# Patient Record
Sex: Male | Born: 1950 | Race: White | Hispanic: No | Smoking: Former smoker
Health system: Southern US, Community
[De-identification: ages and names within clinical notes are randomized; demographics above are authoritative.]

## PROBLEM LIST (undated history)

## (undated) DIAGNOSIS — G4733 Obstructive sleep apnea (adult) (pediatric): Secondary | ICD-10-CM

## (undated) DIAGNOSIS — I480 Paroxysmal atrial fibrillation: Secondary | ICD-10-CM

## (undated) DIAGNOSIS — G4700 Insomnia, unspecified: Secondary | ICD-10-CM

## (undated) DIAGNOSIS — R6521 Severe sepsis with septic shock: Secondary | ICD-10-CM

## (undated) DIAGNOSIS — I82619 Acute embolism and thrombosis of superficial veins of unspecified upper extremity: Secondary | ICD-10-CM

## (undated) DIAGNOSIS — I739 Peripheral vascular disease, unspecified: Secondary | ICD-10-CM

## (undated) DIAGNOSIS — I10 Essential (primary) hypertension: Secondary | ICD-10-CM

## (undated) DIAGNOSIS — R7989 Other specified abnormal findings of blood chemistry: Secondary | ICD-10-CM

## (undated) DIAGNOSIS — K219 Gastro-esophageal reflux disease without esophagitis: Secondary | ICD-10-CM

## (undated) DIAGNOSIS — J449 Chronic obstructive pulmonary disease, unspecified: Secondary | ICD-10-CM

## (undated) DIAGNOSIS — D649 Anemia, unspecified: Secondary | ICD-10-CM

## (undated) DIAGNOSIS — A419 Sepsis, unspecified organism: Secondary | ICD-10-CM

## (undated) DIAGNOSIS — I350 Nonrheumatic aortic (valve) stenosis: Secondary | ICD-10-CM

## (undated) DIAGNOSIS — R778 Other specified abnormalities of plasma proteins: Secondary | ICD-10-CM

## (undated) DIAGNOSIS — Z86711 Personal history of pulmonary embolism: Secondary | ICD-10-CM

## (undated) DIAGNOSIS — T7840XA Allergy, unspecified, initial encounter: Secondary | ICD-10-CM

## (undated) HISTORY — DX: Nonrheumatic aortic (valve) stenosis: I35.0

## (undated) HISTORY — DX: Allergy, unspecified, initial encounter: T78.40XA

## (undated) HISTORY — DX: Essential (primary) hypertension: I10

## (undated) HISTORY — DX: Chronic obstructive pulmonary disease, unspecified: J44.9

## (undated) HISTORY — DX: Acute embolism and thrombosis of superficial veins of unspecified upper extremity: I82.619

## (undated) HISTORY — DX: Other specified abnormal findings of blood chemistry: R79.89

## (undated) HISTORY — PX: CHOLECYSTECTOMY: SHX55

## (undated) HISTORY — DX: Other specified abnormalities of plasma proteins: R77.8

## (undated) HISTORY — DX: Sepsis, unspecified organism: A41.9

## (undated) HISTORY — DX: Peripheral vascular disease, unspecified: I73.9

## (undated) HISTORY — DX: Paroxysmal atrial fibrillation: I48.0

## (undated) HISTORY — DX: Sepsis, unspecified organism: R65.21

## (undated) HISTORY — DX: Anemia, unspecified: D64.9

## (undated) HISTORY — DX: Gastro-esophageal reflux disease without esophagitis: K21.9

## (undated) HISTORY — DX: Insomnia, unspecified: G47.00

---

## 2001-02-10 ENCOUNTER — Emergency Department (HOSPITAL_COMMUNITY): Admission: EM | Admit: 2001-02-10 | Discharge: 2001-02-10 | Payer: Self-pay

## 2001-07-11 ENCOUNTER — Emergency Department (HOSPITAL_COMMUNITY): Admission: EM | Admit: 2001-07-11 | Discharge: 2001-07-11 | Payer: Self-pay | Admitting: Emergency Medicine

## 2001-07-13 ENCOUNTER — Emergency Department (HOSPITAL_COMMUNITY): Admission: EM | Admit: 2001-07-13 | Discharge: 2001-07-13 | Payer: Self-pay | Admitting: Emergency Medicine

## 2001-07-14 ENCOUNTER — Encounter (HOSPITAL_COMMUNITY): Admission: RE | Admit: 2001-07-14 | Discharge: 2001-10-12 | Payer: Self-pay | Admitting: Emergency Medicine

## 2005-07-02 ENCOUNTER — Inpatient Hospital Stay (HOSPITAL_COMMUNITY): Admission: EM | Admit: 2005-07-02 | Discharge: 2005-07-05 | Payer: Self-pay | Admitting: Emergency Medicine

## 2006-02-16 ENCOUNTER — Inpatient Hospital Stay (HOSPITAL_COMMUNITY): Admission: EM | Admit: 2006-02-16 | Discharge: 2006-02-22 | Payer: Self-pay | Admitting: Emergency Medicine

## 2006-02-20 ENCOUNTER — Ambulatory Visit: Payer: Self-pay | Admitting: Physical Medicine & Rehabilitation

## 2007-03-05 ENCOUNTER — Inpatient Hospital Stay (HOSPITAL_COMMUNITY): Admission: EM | Admit: 2007-03-05 | Discharge: 2007-03-07 | Payer: Self-pay | Admitting: Emergency Medicine

## 2007-03-05 ENCOUNTER — Ambulatory Visit: Payer: Self-pay | Admitting: *Deleted

## 2007-03-22 ENCOUNTER — Encounter (INDEPENDENT_AMBULATORY_CARE_PROVIDER_SITE_OTHER): Payer: Self-pay | Admitting: Internal Medicine

## 2007-03-22 ENCOUNTER — Ambulatory Visit: Payer: Self-pay | Admitting: Internal Medicine

## 2007-03-22 ENCOUNTER — Telehealth: Payer: Self-pay | Admitting: *Deleted

## 2007-03-22 DIAGNOSIS — F411 Generalized anxiety disorder: Secondary | ICD-10-CM | POA: Insufficient documentation

## 2007-03-22 DIAGNOSIS — J449 Chronic obstructive pulmonary disease, unspecified: Secondary | ICD-10-CM

## 2007-03-22 DIAGNOSIS — R740 Nonspecific elevation of levels of transaminase and lactic acid dehydrogenase [LDH]: Secondary | ICD-10-CM

## 2007-03-22 DIAGNOSIS — K219 Gastro-esophageal reflux disease without esophagitis: Secondary | ICD-10-CM

## 2007-03-22 DIAGNOSIS — Z8719 Personal history of other diseases of the digestive system: Secondary | ICD-10-CM | POA: Insufficient documentation

## 2007-03-22 DIAGNOSIS — R7402 Elevation of levels of lactic acid dehydrogenase (LDH): Secondary | ICD-10-CM | POA: Insufficient documentation

## 2007-03-22 DIAGNOSIS — J4489 Other specified chronic obstructive pulmonary disease: Secondary | ICD-10-CM | POA: Insufficient documentation

## 2007-03-22 DIAGNOSIS — F17200 Nicotine dependence, unspecified, uncomplicated: Secondary | ICD-10-CM

## 2007-03-23 LAB — CONVERTED CEMR LAB
ALT: 12 units/L (ref 0–53)
BUN: 9 mg/dL (ref 6–23)
Calcium: 9.5 mg/dL (ref 8.4–10.5)
Chloride: 105 meq/L (ref 96–112)
Cholesterol: 213 mg/dL — ABNORMAL HIGH (ref 0–200)
Creatinine, Ser: 0.97 mg/dL (ref 0.40–1.50)
Glucose, Bld: 101 mg/dL — ABNORMAL HIGH (ref 70–99)
HDL: 54 mg/dL (ref >39)
LDL Cholesterol: 143 mg/dL — ABNORMAL HIGH (ref 0–99)
Potassium: 4.8 meq/L (ref 3.5–5.3)
Total Bilirubin: 0.5 mg/dL (ref 0.3–1.2)
Total Protein: 6.9 g/dL (ref 6.0–8.3)
VLDL: 16 mg/dL (ref 0–40)

## 2007-04-04 ENCOUNTER — Ambulatory Visit: Payer: Self-pay | Admitting: Gastroenterology

## 2007-04-04 LAB — CONVERTED CEMR LAB
ALT: 16 units/L (ref 0–53)
AST: 22 units/L (ref 0–37)
Alkaline Phosphatase: 81 units/L (ref 39–117)
Amylase: 72 units/L (ref 27–131)
Basophils Relative: 0.5 % (ref 0.0–1.0)
Eosinophils Absolute: 0.2 10*3/uL (ref 0.0–0.6)
HCT: 35.8 % — ABNORMAL LOW (ref 39.0–52.0)
Lymphocytes Relative: 22.5 % (ref 12.0–46.0)
Monocytes Absolute: 0.7 10*3/uL (ref 0.2–0.7)
Monocytes Relative: 9.3 % (ref 3.0–11.0)
Neutro Abs: 4.6 10*3/uL (ref 1.4–7.7)
Neutrophils Relative %: 65.2 % (ref 43.0–77.0)
RDW: 12.7 % (ref 11.5–14.6)

## 2007-04-05 ENCOUNTER — Encounter: Payer: Self-pay | Admitting: Gastroenterology

## 2007-04-05 ENCOUNTER — Ambulatory Visit: Payer: Self-pay | Admitting: Gastroenterology

## 2007-04-12 ENCOUNTER — Encounter: Payer: Self-pay | Admitting: Gastroenterology

## 2007-04-12 ENCOUNTER — Ambulatory Visit: Payer: Self-pay | Admitting: Gastroenterology

## 2008-04-23 ENCOUNTER — Encounter (INDEPENDENT_AMBULATORY_CARE_PROVIDER_SITE_OTHER): Payer: Self-pay | Admitting: *Deleted

## 2008-05-07 ENCOUNTER — Encounter (INDEPENDENT_AMBULATORY_CARE_PROVIDER_SITE_OTHER): Payer: Self-pay | Admitting: *Deleted

## 2009-09-29 ENCOUNTER — Telehealth: Payer: Self-pay | Admitting: Gastroenterology

## 2010-04-15 NOTE — Progress Notes (Signed)
Summary: Schedule Colonoscopy   Phone Note Outgoing Call Call back at Home Phone [phone]   Call placed by: Harlow Mares CMA Duncan Dull),  September 29, 2009 3:17 PM Call placed to: Patient Summary of Call: called pt he states that he never recieved the letter but if we will mail him another letter to his new address he will call back later to schedule his colonoscopy I changed his address in EMR and when we mail out the next batch of letter for patients I will make sure he gets a reminders letter Initial call taken by: Harlow Mares CMA Duncan Dull),  September 29, 2009 3:18 PM

## 2010-07-27 NOTE — Letter (Signed)
April 04, 2007    [name]   RE:  [name], Warren [name]  MRN:  [id number]  /  DOB:  [birth year]   Dear Mr. [name]:   It is my pleasure to have treated you recently as a new patient in my  office.  I appreciate your confidence and the opportunity to participate  in your care.   Since I do have a busy inpatient endoscopy schedule and office schedule,  my office hours vary weekly.  I am, however, available for emergency  calls every day through my office.  If I cannot promptly meet an urgent  office appointment, another one of our gastroenterologists will be able  to assist you.   My well-trained staff are prepared to help you at all times.  For  emergencies after office hours, a physician from our gastroenterology  section is always available through my 24-hour answering service.   While you are under my care, I encourage discussion of your questions  and concerns, and I will be happy to return your calls as soon as I am  available.   Once again, I welcome you as a new patient and I look forward to a happy  and healthy relationship.    Sincerely,      Barbette Hair. Arlyce Dice, MD,FACG  Electronically Signed   RDK/MedQ  DD: 04/04/2007  DT: 04/04/2007  Job #: [id number]

## 2010-07-27 NOTE — Letter (Signed)
April 04, 2007    Dr. Elby Showers  [facility]  [address]  [place], [place] [address]   RE:  [name], Warren  MRN:  [id number]  /  DOB:  1950-08-09   Dear Dr. Clent Ridges:   Upon your kind referral, I had the pleasure of evaluating your patient  and I am pleased to offer my findings.  I saw [name] in the  office today.  Enclosed is a copy of my progress note that details my  findings and recommendations.   Thank you for the opportunity to participate in your patient's care.    Sincerely,      Barbette Hair. Arlyce Dice, MD,FACG  Electronically Signed    RDK/MedQ  DD: 04/04/2007  DT: 04/04/2007  Job #: [id number]
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
## 2010-07-27 NOTE — Assessment & Plan Note (Signed)
[facility]                         GASTROENTEROLOGY OFFICE NOTE   KALAB, CAMPS                       MRN:          [id number]  DATE:04/04/2007                            DOB:          10-Oct-1950    REASON FOR CONSULTATION:  Pancreatitis.   Warren [name] is a 60 year old white male referred through the courtesy of  Dr. Clent Ridges for evaluation.  He was hospitalized for 2 days before  Christmas for acute pancreatitis.  He has a history of heavy alcohol  use, though he has had none for 6 months.  Labs were pertinent for an  alkaline phos at 263, and a total bilirubin of 4.3, and transaminases  ranging from the 200s to the mid 100s.  Abdominal CT demonstrated a  fatty liver, and questionable increased gastric folds (it was unclear  whether the stomach was fully distended).  Biliary tract looked normal.  He is status post cholecystectomy.  He has had a prior episode of  pancreatitis about 15 months before that occurred about several months  after his cholecystectomy.  He was also pertinent for a 6.6-mm enhancing  lesion of the right lobe of the liver that was etiology indeterminate.  It was also pertinent for calcifications within the uncinate process and  pancreatic head.   Since discharge, Warren [name] has had no further episodes of pain.  He has  no other GI complaints.   PAST MEDICAL HISTORY:  Pertinent for hypertension, anxiety and  depression.   FAMILY HISTORY:  Noncontributory.   MEDICATIONS:  1. Trazodone.  2. Zantac.  3. Potassium.   He has no allergies.   He smokes a pack a day.  He no longer drinks.  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   REVIEW OF SYSTEMS:  Positive for fatigue and back pain.   PHYSICAL EXAMINATION:  Pulse 80, blood pressure 130/80, weight 199.  HEENT: EOMI.  PERRLA.  Sclerae are anicteric.  Conjunctivae are pink.  NECK:  Supple without thyromegaly, adenopathy or carotid bruits.  CHEST:  Clear to  auscultation and percussion without adventitious  sounds.  CARDIAC:  Regular rhythm; normal S1 S2.  There are no murmurs, gallops  or rubs.  ABDOMEN:  Bowel sounds are normoactive.  Abdomen is soft, nontender and  nondistended.  There are no abdominal masses, tenderness, splenic  enlargement or hepatomegaly.  EXTREMITIES:  Full range of motion.  No cyanosis, clubbing or edema.  RECTAL:  Deferred.   IMPRESSION:  1. A recent episode of acute pancreatitis.  Etiology, presumably, is      alcohol, though he claims to have abstained over the last 6 months.      A CT does show changes of chronic pancreatitis.  Biliary tract      disease from retained recurrent bile duct stones is a      consideration, though bile duct measured only 3.8 mm.  2. Right lobe hepatic lesion.  This could be hemangioma.  Significance      is uncertain at this time.  3. Questionable enlarged folds of the gastric body.  4. Hepatic steatosis.  RECOMMENDATION:  1. Upper endoscopy.  2. Repeat liver function tests.  3. Screening colonoscopy.     Warren [name]. Arlyce Dice, MD,FACG  Electronically Signed    RDK/MedQ  DD: 04/04/2007  DT: 04/04/2007  Job #: [id number]   cc:   Elby Showers, MD

## 2010-07-30 NOTE — Discharge Summary (Signed)
NAME:  Warren [name], Warren [name]                ACCOUNT NO.:  [id number]   MEDICAL RECORD NO.:  [id number]          PATIENT TYPE:  INP   LOCATION:  3034                         FACILITY:  [facility]   PHYSICIAN:  Melissa L. Ladona Ridgel, MD  DATE OF BIRTH:  Dec 11, 1950   DATE OF ADMISSION:  07/01/2005  DATE OF DISCHARGE:  07/05/2005                               DISCHARGE SUMMARY   CHIEF COMPLAINT:  Chest and abdominal pain.   DISCHARGING DIAGNOSES:  1. Chest and abdominal pain secondary to acute pancreatitis, likely      secondary to alcohol abuse.  The patient was treated with      intravenous hydration, antiemetic medications and pain medications.      It was determined that his pancreatitis was likely the source and      therefore bowel rest was recommended.  2. Gastroesophageal reflux disease:  The patient was treated with a      proton pump inhibitor.  3. Hypertension.  The patient was maintained on his propranolol and      his hydrochlorothiazide was resumed at the time of discharge.   MEDICATIONS AT TIME OF DISCHARGE:  1. Prilosec 20 mg once daily.  2. Propranolol 80 mg once daily.  3. Clonazepam 1 mg p.o. p.r.n.  4. Hydrochlorothiazide 25 mg once daily.  5. Aspirin 325 mg once daily.   HOSPITAL COURSE:  Please note that the patient's steroids were  discontinued on the day of discharge, July 02, 2005.  At the time of  discharge, the patient is deemed stable for followup as an outpatient.   DIET:  He was discharged to home on a heart-healthy diet.   FOLLOWUP:  Follow up with a physician of his choice.   DISCHARGE INSTRUCTIONS:  The patient was instructed on alcohol abuse and  avoidance of alcohol.      Melissa L. Ladona Ridgel, MD  Electronically Signed     MLT/MEDQ  D:  08/10/2005  T:  08/10/2005  Job:  [id number]

## 2010-07-30 NOTE — H&P (Signed)
NAME:  Warren [name], Warren [name]                ACCOUNT NO.:  [id number]   MEDICAL RECORD NO.:  [id number]          PATIENT TYPE:  INP   LOCATION:  1825                         FACILITY:  [facility]   PHYSICIAN:  Michelene Gardener, MD    DATE OF BIRTH:  1950/12/02   DATE OF ADMISSION:  07/02/2005  DATE OF DISCHARGE:  06/15/2005                                HISTORY & PHYSICAL   PRIMARY CARE PHYSICIAN:  The patient is unassigned; this is Horticulturist, commercial.   CHIEF COMPLAINT:  Left-sided chest pain and epigastric pain since 10:30 a.m.  of July 02, 2005.   HISTORY OF PRESENT ILLNESS:  This is a 60 year old Caucasian male with past  medical history of hypotension, alcohol and gallstones, who presented with  the above-mentioned complaint.  The patient had his gallbladder removed  around 6 months ago.  Around 10:30 a.m. of yesterday, he started developing  pain.  The pain started in his left side of his chest and then went down  into his epigastric region and his right upper quadrant and left upper  extremity.  The pain is described as sharp, around 8-9/10, as mentioned,  radiating down to his epigastric region and to his right and left upper  quadrant regions.  It is associated with nausea and the patient has vomited  once.  He stated the vomitus contained yellowish material and there is no  blood.  He denied diarrhea in the ER and he was given pain medication; his  pain went down to around 7/10.  Ultrasound was done in the emergency room  and it was negative as per the ER attending.   PAST MEDICAL HISTORY:  1.  Hypertension.  2.  Anxiety.  3.  Acid reflux.  4.  Alcohol drinking.   PAST SURGICAL HISTORY:  Status post cholecystectomy done around 6 months  ago.   MEDICATIONS:  1.  Aspirin 325 mg once daily.  2.  Clonazepam 1 mg once to twice daily p.r.n.  3.  Hydrochlorothiazide 25 mg once daily.  4.  Propranolol 80 mg once daily.   ALLERGIES:  No known drug allergies.   SOCIAL HISTORY:  The  patient smoked around 2 packs per day for 45 years.  He  drinks alcohol almost every day, most evenings beer, but he states he also  drinks vodka; the last time was Thursday, where he took 2 vodkas.  He denies  recreational drugs.   FAMILY HISTORY:  Family history is significant for hypertension.   REVIEW OF SYSTEMS:  Review of systems showed left-sided sharp chest pain,  epigastric and left upper quadrant pain, nausea and vomiting.  CONSTITUTIONAL:  There is no fever, no weakness and no fatigability.  EYES:  No blurred vision.  No pain.  No redness.  ENT:  No tinnitus, no ear pain,  no hearing loss, no epistaxis, no difficulty swallowing.  RESPIRATORY:  No  cough, no wheezes, no hemoptysis, no dyspnea.  CARDIOVASCULAR:  No chest  pain, no edema, no palpitations, no syncope.  GI:  Positive for nausea,  vomiting and abdominal pain.  There is  no diarrhea, no constipation, no  hematemesis and no melena.  GU:  No dysuria, no hematuria, no frequency.  ENDOCRINE:  No polyuria, no nocturia, no sweating.  ID:  No acne, no rash,  no lesions.  NEUROLOGIC:  No numbness, no tingling, no weakness, no  dysarthria, no tremors and no headache.  The rest of the systems were  reviewed and they were negative.   PHYSICAL EXAMINATION:  VITAL SIGNS:  Temperature is 97.2, blood pressure  140/85, respiratory rate is 10, pulse is 79.  GENERAL APPEARANCE:  This is a middle-aged Caucasian male who is lying down  in bed and seems in some distress because of his pain.  HEENT:  Conjunctivae are normal.  There is no blood or erythema.  Pupils are  equal, round and reactive to light and accommodation.  There is no ptosis.  Hearing is intact.  There is no ear discharge or infection.  There is no  nose discharge, infection or bleeding.  Oral mucosa is moist and there is no  pharyngeal erythema.  NECK:  Supple.  No JVD, no carotid bruit, no lymphadenopathy, no thyroid  enlargement, no thyroid tenderness.   CARDIOVASCULAR:  S1 and S2 are heard.  There are no additional heart sounds.  There are no murmurs and no gallops noticed.  Peripheral pulses are strong  with no bruit and no femoral pulses are strong with no bruit.  RESPIRATORY:  The patient is breathing between 10-14.  There is no use of  accessory muscles, no intercostal retractions, no dullness, no  hyperresonance, no rales, no rhonchi and no wheezes.  ABDOMEN:  The abdomen is soft.  There is tenderness in the epigastric region  in the left upper quadrant region.  There is no hepatosplenomegaly.  Bowel  sounds are normal.  EXTREMITIES:  Lower extremities showed no edema, no varicose veins and pedal  pulses normal.  SKIN:  No rash, no erythema and skin is warm to touch.  NEUROLOGICAL:  Cranial nerves are intact, around II-XII.  There are no motor  or sensory deficits.  PSYCHIATRIC:  The patient looks anxious around his condition.  He is alert,  awake and oriented x3.  No recent or remote memory impairment.   LABORATORY RESULTS:  WBC 11.7, hemoglobin 15.0, hematocrit 41.9, platelet  count is 304,000 and MCV is 101.  Sodium 134, potassium 3.2, chloride 101,  BUN 13, glucose is 106, AST is 74, ALT 56, alkaline phosphatase is 81.   EKG showed normal sinus rhythm at 66 beats per minute.  There are no Q  waves.  There is nonspecific T wave inversion in leads V1 and aVR, no ST  segment abnormalities.   IMPRESSION:  This is a 60 year old male with past medical history of  gallstones, status post cholecystectomy, who presented with atypical chest  pain radiating down to his epigastrium and left upper quadrant.   ASSESSMENT AND PLAN:  1.  Pancreatitis:  As mentioned, this patient has a history of gallstones,      status post cholecystectomy.  He is also a heavy alcohol drinker.  His      laboratories are consistent with pancreatitis.  His amylase is elevated     at 169 and his lipase is 259, so we will admit him to the floor, keep      him  nothing-by-mouth and give him intravenous fluids.  When the patient      is tolerating, then we will put on liquid diet and then increase  as      tolerated.  We will also give him antiemetics and pain medications as      needed.  If his condition deteriorates or if the pain persists, then we      will get a CT scan of the abdomen to rule out any complications from      pancreatitis.  2.  Atypical chest pain:  The way that the pain is described, it does not      seem to be like cardiac in origin and most likely it is from his      pancreatitis.  We will still admit him to Telemetry, given the fact that      he has a risk factor of hypertension and smoking.  He had already 2 sets      of troponin and cardiac enzymes that came to be normal.  His initial      EKGs are also normal.  We will get a third set of troponins.  If his      condition deteriorates or the chest pain persists, then we will consider      further intervention in the form of a stress test.  3.  Hyponatremia:  We will give him intravenous fluids and will repeat his      basic metabolic panel.  4.  Hypokalemia:  This might be secondary to his vomiting.  We will give him      [facility] and we will repeat his potassium level.  5.  Elevated liver function tests:  We will repeat his hepatic function      panel.  6.  Hypertension:  We will continue his current medications and we will      monitor his blood pressure.  7.  Gastroesophageal reflux disease.  8.  Anxiety.  9.  Alcohol abuse:  There are no symptoms of alcohol withdrawal at the      present time.  We will follow him and if he develops any signs of      withdrawal, then we will start him on Ativan protocol.  We will also      start him on thiamine, multivitamin and folic acid.   TOTAL ASSESSMENT TIME:  Fifty minutes.           ______________________________  Michelene Gardener, MD     NAE/MEDQ  D:  07/02/2005  T:  07/02/2005  Job:  [id number]
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
## 2010-07-30 NOTE — Discharge Summary (Signed)
NAME:  Warren [name], Warren [name]                ACCOUNT NO.:  [id number]   MEDICAL RECORD NO.:  [id number]          PATIENT TYPE:  INP   LOCATION:  3034                         FACILITY:  [facility]   PHYSICIAN:  Melissa L. Ladona Ridgel, MD  DATE OF BIRTH:  April 07, 1950   DATE OF ADMISSION:  07/01/2005  DATE OF DISCHARGE:  07/05/2005                                 DISCHARGE SUMMARY   CHIEF COMPLAINT:  Chest and abdominal pain.   DISCHARGING DIAGNOSES:  1.  Chest and abdominal pain secondary to acute pancreatitis, likely      secondary to alcohol abuse.  The patient was treated with intravenous      hydration, antiemetic medications and pain medications.  It was      determined that his pancreatitis was likely the source and therefore      bowel rest was recommended.  2.  Gastroesophageal reflux disease:  The patient was treated with a proton      pump inhibitor.  3.  Hypertension.  The patient was maintained on his propranolol and his      hydrochlorothiazide was resumed at the time of discharge.   MEDICATIONS AT TIME OF DISCHARGE:  1.  Prilosec 20 mg once daily.  2.  Propranolol 80 mg once daily.  3.  Clonazepam 1 mg p.o. p.r.n.  4.  Hydrochlorothiazide 25 mg once daily.  5.  Aspirin 325 mg once daily.   HOSPITAL COURSE:  Please note that the patient's steroids were discontinued  on the day of discharge, July 02, 2005.  At the time of discharge, the  patient is deemed stable for followup as an outpatient.   DIET:  He was discharged to home on a heart-healthy diet.   FOLLOWUP:  Follow up with a physician of his choice.   DISCHARGE INSTRUCTIONS:  The patient was instructed on alcohol abuse and  avoidance of alcohol.      Melissa L. Ladona Ridgel, MD     MLT/MEDQ  D:  08/10/2005  T:  08/10/2005  Job:  [id number]
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
## 2010-12-17 LAB — BILIRUBIN, FRACTIONATED(TOT/DIR/INDIR)
Bilirubin, Direct: 3.4 — ABNORMAL HIGH
Indirect Bilirubin: 1.6 — ABNORMAL HIGH
Total Bilirubin: 5 — ABNORMAL HIGH

## 2010-12-17 LAB — URINALYSIS, ROUTINE W REFLEX MICROSCOPIC
Glucose, UA: NEGATIVE
Hgb urine dipstick: NEGATIVE
Ketones, ur: NEGATIVE
Protein, ur: NEGATIVE
pH: 7

## 2010-12-17 LAB — POCT I-STAT CREATININE
Creatinine, Ser: 1.1
Operator id: 294511

## 2010-12-17 LAB — CBC
HCT: 34.7 — ABNORMAL LOW
HCT: 37.8 — ABNORMAL LOW
Hemoglobin: 12 — ABNORMAL LOW
Hemoglobin: 12.8 — ABNORMAL LOW
MCHC: 34
MCV: 95.1
Platelets: 269
Platelets: 271
RBC: 3.94 — ABNORMAL LOW
RDW: 12.7
RDW: 13.1
WBC: 5.6
WBC: 6.9

## 2010-12-17 LAB — ETHANOL: Alcohol, Ethyl (B): <5

## 2010-12-17 LAB — POCT CARDIAC MARKERS
Myoglobin, poc: 123
Operator id: 294511

## 2010-12-17 LAB — COMPREHENSIVE METABOLIC PANEL
AST: 221 — ABNORMAL HIGH
Albumin: 2.9 — ABNORMAL LOW
Albumin: 3.2 — ABNORMAL LOW
Alkaline Phosphatase: 247 — ABNORMAL HIGH
Alkaline Phosphatase: 263 — ABNORMAL HIGH
BUN: 1 — ABNORMAL LOW
BUN: 6
CO2: 25
CO2: 26
Chloride: 103
Chloride: 97
Chloride: 97
Creatinine, Ser: 0.89
Creatinine, Ser: 1.25
GFR calc Af Amer: >60
GFR calc Af Amer: >60
GFR calc non Af Amer: >60
Glucose, Bld: 116 — ABNORMAL HIGH
Glucose, Bld: 134 — ABNORMAL HIGH
Potassium: 3.1 — ABNORMAL LOW
Potassium: 4.3
Sodium: 135
Total Bilirubin: 1.9 — ABNORMAL HIGH
Total Bilirubin: 4.3 — ABNORMAL HIGH
Total Bilirubin: 5.1 — ABNORMAL HIGH
Total Protein: 6.1

## 2010-12-17 LAB — HEPATITIS PANEL, ACUTE
HCV Ab: NEGATIVE
Hep A IgM: NEGATIVE
Hep B C IgM: NEGATIVE

## 2010-12-17 LAB — DIFFERENTIAL
Basophils Absolute: 0
Eosinophils Relative: 2
Lymphocytes Relative: 19
Lymphs Abs: 1.2
Neutro Abs: 4.1

## 2010-12-17 LAB — CK TOTAL AND CKMB (NOT AT ARMC)
CK, MB: 1
Total CK: 66

## 2010-12-17 LAB — TSH: TSH: 1.105

## 2010-12-17 LAB — TROPONIN I: Troponin I: 0.04

## 2010-12-17 LAB — I-STAT 8, (EC8 V) (CONVERTED LAB)
Acid-Base Excess: 2
Bicarbonate: 26.4 — ABNORMAL HIGH
Chloride: 101
HCT: 46
Operator id: 294511
TCO2: 28
pCO2, Ven: 40.3 — ABNORMAL LOW
pH, Ven: 7.424 — ABNORMAL HIGH

## 2010-12-17 LAB — LIPASE, BLOOD: Lipase: 1503 — ABNORMAL HIGH

## 2010-12-17 LAB — LIPID PANEL
HDL: 44
Total CHOL/HDL Ratio: 4.8

## 2010-12-17 LAB — RAPID URINE DRUG SCREEN, HOSP PERFORMED
Amphetamines: NOT DETECTED
Benzodiazepines: NOT DETECTED
Cocaine: NOT DETECTED

## 2010-12-17 LAB — ACETAMINOPHEN LEVEL: Acetaminophen (Tylenol), Serum: <10 — ABNORMAL LOW

## 2010-12-17 LAB — HIV-1 RNA QUANT-NO REFLEX-BLD: HIV-1 RNA Quant, Log: <1.7 (ref <1.70)

## 2010-12-17 LAB — CARDIAC PANEL(CRET KIN+CKTOT+MB+TROPI)
CK, MB: 1
Relative Index: INVALID
Troponin I: 0.03

## 2010-12-17 LAB — AMMONIA: Ammonia: 31

## 2011-07-27 ENCOUNTER — Encounter: Payer: Self-pay | Admitting: Gastroenterology

## 2016-01-08 ENCOUNTER — Encounter: Payer: Self-pay | Admitting: Gastroenterology

## 2016-03-01 ENCOUNTER — Telehealth: Payer: Self-pay | Admitting: *Deleted

## 2016-03-01 NOTE — Telephone Encounter (Signed)
Thanks for your note.  Yes if he has not had an EGD within 3 years he should have one for Barrett's I think if he is off oxygen for the past 3 weeks and does not use it normally anymore, he should be okay to have procedures done in the [place]. thanks

## 2016-03-01 NOTE — Telephone Encounter (Signed)
Dr Havery Moros,  1.When reviewing pt paper work from [facility] for pre-visit, I noticed pt was on O2 at home, I called pt and spoke with him, he states he was using it after he had pneumonia in July up to about 3 weeks ago, he states he has not used it since, is pt ok for [facility]? Do you want OV?  2.Also when looking back at pt history, he had an EGD 04/05/07 with dx of barrett's, does this need to be followed up? Does pt need EGD as well?  Thanks-adm

## 2016-03-01 NOTE — Telephone Encounter (Signed)
Called Warren [name] back to advise Dr Havery Moros does recommend EGD to follow-up with barrett's dx in 2009, I cancelled Warren [name]'s current appoitments and rescheduled Warren [name] for previsit 1/24 and endo/colon on 04/14/16.-adm

## 2016-03-21 ENCOUNTER — Encounter: Payer: Self-pay | Admitting: Gastroenterology

## 2016-04-06 ENCOUNTER — Encounter: Payer: Self-pay | Admitting: Family

## 2016-04-14 ENCOUNTER — Encounter: Payer: Self-pay | Admitting: Gastroenterology

## 2016-05-19 ENCOUNTER — Ambulatory Visit (AMBULATORY_SURGERY_CENTER): Payer: Self-pay

## 2016-05-19 ENCOUNTER — Telehealth: Payer: Self-pay

## 2016-05-19 DIAGNOSIS — Z860100 Personal history of colon polyps, unspecified: Secondary | ICD-10-CM

## 2016-05-19 DIAGNOSIS — K219 Gastro-esophageal reflux disease without esophagitis: Secondary | ICD-10-CM

## 2016-05-19 MED ORDER — SUPREP BOWEL PREP KIT 17.5-3.13-1.6 GM/177ML PO SOLN: 1.0000 | Freq: Once | ORAL | 0 refills | Status: AC

## 2016-05-19 NOTE — Telephone Encounter (Signed)
Yes okay in my opinion if only with CPAP but may want to check with anesthesia to confirm okay with them. thanks

## 2016-05-19 NOTE — Progress Notes (Signed)
No allergies to eggs or soy Uses O2 at night with CPAP No diet meds   "Fair" prep  Declined emmi

## 2016-05-19 NOTE — Telephone Encounter (Signed)
Dr Havery Moros,      Barber to proceed with procedure here at [facility]?  Uses O2 only at night and only VIA CPAP. Thank you, Xavia Kniskern/PV

## 2016-05-19 NOTE — Telephone Encounter (Signed)
OK per Jenny Reichmann and [place] CRNAs

## 2016-06-02 ENCOUNTER — Ambulatory Visit (AMBULATORY_SURGERY_CENTER): Payer: Medicare Other | Admitting: Gastroenterology

## 2016-06-02 VITALS — BP 106/57 | HR 93 | Temp 98.0°F | Resp 18 | Ht 73.0 in | Wt 198.0 lb

## 2016-06-02 DIAGNOSIS — K2950 Unspecified chronic gastritis without bleeding: Secondary | ICD-10-CM | POA: Diagnosis not present

## 2016-06-02 DIAGNOSIS — Z86010 Personal history of colonic polyps: Secondary | ICD-10-CM

## 2016-06-02 DIAGNOSIS — K2270 Barrett's esophagus without dysplasia: Secondary | ICD-10-CM

## 2016-06-02 DIAGNOSIS — D124 Benign neoplasm of descending colon: Secondary | ICD-10-CM

## 2016-06-02 DIAGNOSIS — D126 Benign neoplasm of colon, unspecified: Secondary | ICD-10-CM | POA: Diagnosis not present

## 2016-06-02 DIAGNOSIS — D123 Benign neoplasm of transverse colon: Secondary | ICD-10-CM

## 2016-06-02 DIAGNOSIS — K219 Gastro-esophageal reflux disease without esophagitis: Secondary | ICD-10-CM | POA: Diagnosis not present

## 2016-06-02 DIAGNOSIS — K635 Polyp of colon: Secondary | ICD-10-CM

## 2016-06-02 MED ORDER — SODIUM CHLORIDE 0.9 % IV SOLN: 500.0000 mL | INTRAVENOUS | Status: DC

## 2016-06-02 MED ORDER — OMEPRAZOLE 40 MG PO CPDR: 40.0000 mg | DELAYED_RELEASE_CAPSULE | Freq: Two times a day (BID) | ORAL | 2 refills | Status: DC

## 2016-06-02 NOTE — Patient Instructions (Signed)
YOU HAD AN ENDOSCOPIC PROCEDURE TODAY AT [facility]:   Refer to the procedure report that was given to you for any specific questions about what was found during the examination.  If the procedure report does not answer your questions, please call your gastroenterologist to clarify.  If you requested that your care partner not be given the details of your procedure findings, then the procedure report has been included in a sealed envelope for you to review at your convenience later.  YOU SHOULD EXPECT: Some feelings of bloating in the abdomen. Passage of more gas than usual.  Walking can help get rid of the air that was put into your GI tract during the procedure and reduce the bloating. If you had a lower endoscopy (such as a colonoscopy or flexible sigmoidoscopy) you may notice spotting of blood in your stool or on the toilet paper. If you underwent a bowel prep for your procedure, you may not have a normal bowel movement for a few days.  Please Note:  You might notice some irritation and congestion in your nose or some drainage.  This is from the oxygen used during your procedure.  There is no need for concern and it should clear up in a day or so.  SYMPTOMS TO REPORT IMMEDIATELY:   Following lower endoscopy (colonoscopy or flexible sigmoidoscopy):  Excessive amounts of blood in the stool  Significant tenderness or worsening of abdominal pains  Swelling of the abdomen that is new, acute  Fever of 100F or higher   Following upper endoscopy (EGD)  Vomiting of blood or coffee ground material  New chest pain or pain under the shoulder blades  Painful or persistently difficult swallowing  New shortness of breath  Fever of 100F or higher  Black, tarry-looking stools  For urgent or emergent issues, a gastroenterologist can be reached at any hour by calling [phone].   DIET:  We do recommend a small meal at first, but then you may proceed to your regular diet.  Drink  plenty of fluids but you should avoid alcoholic beverages for 24 hours. Try to increase the fiber in your diet, and drink plenty of water.  ACTIVITY:  You should plan to take it easy for the rest of today and you should NOT DRIVE or use heavy machinery until tomorrow (because of the sedation medicines used during the test).    FOLLOW UP: Our staff will call the number listed on your records the next business day following your procedure to check on you and address any questions or concerns that you may have regarding the information given to you following your procedure. If we do not reach you, we will leave a message.  However, if you are feeling well and you are not experiencing any problems, there is no need to return our call.  We will assume that you have returned to your regular daily activities without incident.  If any biopsies were taken you will be contacted by phone or by letter within the next 1-3 weeks.  Please call us at [phone] if you have not heard about the biopsies in 3 weeks.    SIGNATURES/CONFIDENTIALITY: You and/or your care partner have signed paperwork which will be entered into your electronic medical record.  These signatures attest to the fact that that the information above on your After Visit Summary has been reviewed and is understood.  Full responsibility of the confidentiality of this discharge information lies with you and/or your  care-partner.  No aspirin, aleve or ibuprofen for two weeks.  Be sure to take your omeprazole 40mg  twice a day half hour before breakfast and dinner.

## 2016-06-02 NOTE — Op Note (Addendum)
[facility] Patient Name: Warren [name] Procedure Date: 06/02/2016 1:16 PM MRN: [id number] Endoscopist: Remo Lipps P. Ranika Mcniel MD, MD Age: 66 Referring MD:  Date of Birth: 11-30-1950 Gender: Male Account #: [id number] Procedure:                Colonoscopy Indications:              High risk colon cancer surveillance: Personal                            history of colonic polyps Medicines:                Monitored Anesthesia Care Procedure:                Pre-Anesthesia Assessment:                           - Prior to the procedure, a History and Physical                            was performed, and patient medications and                            allergies were reviewed. The patient's tolerance of                            previous anesthesia was also reviewed. The risks                            and benefits of the procedure and the sedation                            options and risks were discussed with the patient.                            All questions were answered, and informed consent                            was obtained. Prior Anticoagulants: The patient has                            taken no previous anticoagulant or antiplatelet                            agents. ASA Grade Assessment: III - A patient with                            severe systemic disease. After reviewing the risks                            and benefits, the patient was deemed in                            satisfactory condition to undergo the procedure.  After obtaining informed consent, the colonoscope                            was passed under direct vision. Throughout the                            procedure, the patient's blood pressure, pulse, and                            oxygen saturations were monitored continuously. The                            Colonoscope was introduced through the anus and                            advanced to the the cecum,  identified by                            appendiceal orifice and ileocecal valve. The                            colonoscopy was performed without difficulty. The                            patient tolerated the procedure well. The quality                            of the bowel preparation was adequate. The                            ileocecal valve, appendiceal orifice, and rectum                            were photographed. Scope In: 1:44:52 PM Scope Out: 2:09:20 PM Scope Withdrawal Time: 0 hours 21 minutes 10 seconds  Total Procedure Duration: 0 hours 24 minutes 28 seconds  Findings:                 The perianal and digital rectal examinations were                            normal.                           A 4 mm polyp was found in the hepatic flexure. The                            polyp was sessile. The polyp was removed with a                            cold snare. Resection and retrieval were complete.                           Two sessile polyps were found in the splenic  flexure. The polyps were 3 to 5 mm in size. The                            larger polyp was removed with a cold snare, the                            smaller polyp removed with cold forceps. Resection                            and retrieval were complete.                           Two sessile polyps were found in the descending                            colon. The polyps were 4 to 5 mm in size. These                            polyps were removed with a cold snare. Resection                            and retrieval were complete.                           Multiple small and large-mouthed diverticula were                            found in the left colon.                           The exam was otherwise without abnormality. The                            bowel prep was only fair during intubation and                            required several minutes of lavage to obtain                             adequate views. Complications:            No immediate complications. Estimated blood loss:                            Minimal. Estimated Blood Loss:     Estimated blood loss was minimal. Impression:               - One 4 mm polyp at the hepatic flexure, removed                            with a cold snare. Resected and retrieved.                           - Two 3 to 5 mm polyps at the splenic flexure,  removed with a cold snare and cold forceps.                            Resected and retrieved.                           - Two 4 to 5 mm polyps in the descending colon,                            removed with a cold snare. Resected and retrieved.                           - Diverticulosis in the left colon.                           - The examination was otherwise normal. Recommendation:           - Patient has a contact number available for                            emergencies. The signs and symptoms of potential                            delayed complications were discussed with the                            patient. Return to normal activities tomorrow.                            Written discharge instructions were provided to the                            patient.                           - Resume previous diet.                           - Continue present medications.                           - No ibuprofen, naproxen, or other non-steroidal                            anti-inflammatory drugs for 2 weeks after polyp                            removal.                           - Await pathology results.                           - Repeat colonoscopy is recommended for                            surveillance.  The colonoscopy date will be                            determined after pathology results from today's                            exam become available for review. Remo Lipps P. Ainslee Sou MD, MD 06/02/2016 2:16:56 PM This report has  been signed electronically.

## 2016-06-02 NOTE — Op Note (Signed)
[facility] Patient Name: Warren [name] Procedure Date: 06/02/2016 1:17 PM MRN: [id number] Endoscopist: Remo Lipps P. Torian Quintero MD, MD Age: 66 Referring MD:  Date of Birth: [birth year] Gender: Male Account #: [id number] Procedure:                Upper GI endoscopy Indications:              Follow-up of Barrett's esophagus (long segment                            diagnosed 2009), on pepsid PRN Medicines:                Monitored Anesthesia Care Procedure:                Pre-Anesthesia Assessment:                           - Prior to the procedure, a History and Physical                            was performed, and patient medications and                            allergies were reviewed. The patient's tolerance of                            previous anesthesia was also reviewed. The risks                            and benefits of the procedure and the sedation                            options and risks were discussed with the patient.                            All questions were answered, and informed consent                            was obtained. Prior Anticoagulants: The patient has                            taken no previous anticoagulant or antiplatelet                            agents. ASA Grade Assessment: III - A patient with                            severe systemic disease. After reviewing the risks                            and benefits, the patient was deemed in                            satisfactory condition to undergo the procedure.  After obtaining informed consent, the endoscope was                            passed under direct vision. Throughout the                            procedure, the patient's blood pressure, pulse, and                            oxygen saturations were monitored continuously. The                            Model GIF-HQ190 [id number]) scope was introduced                            through the mouth,  and advanced to the second part                            of duodenum. The upper GI endoscopy was                            accomplished without difficulty. The patient                            tolerated the procedure well. Scope In: Scope Out: Findings:                 Esophagogastric landmarks were identified: the                            Z-line was found at 25 cm, the gastroesophageal                            junction was found at 37 cm and the upper extent of                            the gastric folds was found at 43 cm from the                            incisors.                           A 6 cm hiatal hernia was present.                           There were esophageal mucosal changes classified as                            Barrett's stage C10-M12 per Prague criteria present                            in the middle third of the esophagus and in the  lower third of the esophagus. No nodularity was                            appreciated. Ulceration was noted in the proximal                            aspect of the segment starting at 27cm from the                            incisors up through 25cm from the incisors. The                            maximum longitudinal extent of these mucosal                            changes was 12 cm in length. Mucosa was biopsied                            with a cold forceps for histology.                           The exam of the esophagus was otherwise normal.                           Diffuse moderate inflammation characterized by                            adherent blood, erythema and friability was found                            in the entire examined stomach, without focal                            ulceration. Biopsies were taken with a cold forceps                            for Helicobacter pylori testing.                           The exam of the stomach was otherwise normal.                            Diffuse mildly erythematous mucosa was found in the                            duodenal bulb.                           The second portion of the duodenum was normal. Complications:            No immediate complications. Estimated blood loss:                            Minimal. Estimated Blood Loss:     Estimated  blood loss was minimal. Impression:               - Esophagogastric landmarks identified.                           - 6 cm hiatal hernia.                           - Esophageal mucosal changes classified as                            Barrett's stage C10-M12 per Prague criteria.                            Biopsied.                           - Esophagitis in the mid to proximal esophagus                           - Gastritis. Biopsied.                           - Erythematous duodenopathy.                           - Normal second portion of the duodenum. Recommendation:           - Patient has a contact number available for                            emergencies. The signs and symptoms of potential                            delayed complications were discussed with the                            patient. Return to normal activities tomorrow.                            Written discharge instructions were provided to the                            patient.                           - Resume previous diet.                           - Continue present medications.                           - Start omeprazole 40mg  twice daily for one month,                            then decrease to once daily                           -  Await pathology results.                           - Repeat upper endoscopy for surveillance based on                            pathology results. Remo Lipps P. Shemicka Cohrs MD, MD 06/02/2016 2:24:55 PM This report has been signed electronically.

## 2016-06-02 NOTE — Progress Notes (Signed)
Called to room to assist during endoscopic procedure.  Patient ID and intended procedure confirmed with present staff. Received instructions for my participation in the procedure from the performing physician.  

## 2016-06-02 NOTE — Progress Notes (Signed)
Pt's states no medical or surgical changes since previsit or office visit. 

## 2016-06-02 NOTE — Progress Notes (Signed)
O2 sat 89 to 90 on  6 L [employer], albuterol from patient given with sat > 91% on 6 L ace mask. vss

## 2016-06-03 ENCOUNTER — Telehealth: Payer: Self-pay | Admitting: *Deleted

## 2016-06-03 NOTE — Telephone Encounter (Signed)
  Follow up Call-  Call back number 06/02/2016  Post procedure Call Back phone  # [phone]  Permission to leave phone message Yes  Some recent data might be hidden     Patient questions:  Do you have a fever, pain , or abdominal swelling? No. Pain Score  0 *  Have you tolerated food without any problems? Yes.    Have you been able to return to your normal activities? Yes.    Do you have any questions about your discharge instructions: Diet   No. Medications  No. Follow up visit  No.  Do you have questions or concerns about your Care? No.  Actions: * If pain score is 4 or above: No action needed, pain <4.

## 2016-06-03 NOTE — Telephone Encounter (Signed)
  Follow up Call-  Call back number 06/02/2016  Post procedure Call Back phone  # [phone]  Permission to leave phone message Yes  Some recent data might be hidden     Patient questions:  Message left to call us if necessary. Will call back later.

## 2016-06-09 ENCOUNTER — Encounter: Payer: Self-pay | Admitting: Gastroenterology

## 2016-11-21 ENCOUNTER — Other Ambulatory Visit: Payer: Self-pay

## 2016-11-21 ENCOUNTER — Telehealth: Payer: Self-pay

## 2016-11-21 ENCOUNTER — Other Ambulatory Visit: Payer: Self-pay | Admitting: Gastroenterology

## 2016-11-21 MED ORDER — OMEPRAZOLE 40 MG PO CPDR: 40.0000 mg | DELAYED_RELEASE_CAPSULE | Freq: Every day | ORAL | 3 refills | Status: DC

## 2016-11-21 NOTE — Telephone Encounter (Signed)
Received refill request for Omeprazole 40mg  bid for 1 month then decrease to 40mg  qd thereafter.  Per endo path notes ok to continue Omeprazole.Dr. Havery Moros wanted pt to be on Omeprazole for bid for one month then decrease to qd.   New rx sent for Omeprazole 40mg  qd #90 with 3 refills to [facility].

## 2018-05-14 DIAGNOSIS — Z01818 Encounter for other preprocedural examination: Secondary | ICD-10-CM | POA: Diagnosis not present

## 2019-04-26 DIAGNOSIS — R0602 Shortness of breath: Secondary | ICD-10-CM

## 2020-05-15 DIAGNOSIS — I350 Nonrheumatic aortic (valve) stenosis: Secondary | ICD-10-CM

## 2020-05-15 DIAGNOSIS — I361 Nonrheumatic tricuspid (valve) insufficiency: Secondary | ICD-10-CM

## 2020-06-18 ENCOUNTER — Emergency Department (HOSPITAL_COMMUNITY): Payer: Medicare Other

## 2020-06-18 ENCOUNTER — Inpatient Hospital Stay (HOSPITAL_COMMUNITY)
Admission: EM | Admit: 2020-06-18 | Discharge: 2020-06-25 | DRG: 871 | Disposition: A | Discharge status: Home Health | Payer: Medicare Other | Attending: Internal Medicine | Admitting: Internal Medicine

## 2020-06-18 ENCOUNTER — Other Ambulatory Visit: Payer: Self-pay

## 2020-06-18 ENCOUNTER — Encounter (HOSPITAL_COMMUNITY): Payer: Self-pay | Admitting: Family Medicine

## 2020-06-18 DIAGNOSIS — Z79899 Other long term (current) drug therapy: Secondary | ICD-10-CM

## 2020-06-18 DIAGNOSIS — E871 Hypo-osmolality and hyponatremia: Secondary | ICD-10-CM | POA: Diagnosis present

## 2020-06-18 DIAGNOSIS — R6521 Severe sepsis with septic shock: Secondary | ICD-10-CM | POA: Diagnosis not present

## 2020-06-18 DIAGNOSIS — Z86711 Personal history of pulmonary embolism: Secondary | ICD-10-CM | POA: Diagnosis not present

## 2020-06-18 DIAGNOSIS — K8031 Calculus of bile duct with cholangitis, unspecified, with obstruction: Secondary | ICD-10-CM | POA: Diagnosis present

## 2020-06-18 DIAGNOSIS — J449 Chronic obstructive pulmonary disease, unspecified: Secondary | ICD-10-CM | POA: Diagnosis present

## 2020-06-18 DIAGNOSIS — J439 Emphysema, unspecified: Secondary | ICD-10-CM | POA: Diagnosis not present

## 2020-06-18 DIAGNOSIS — I739 Peripheral vascular disease, unspecified: Secondary | ICD-10-CM | POA: Diagnosis not present

## 2020-06-18 DIAGNOSIS — I129 Hypertensive chronic kidney disease with stage 1 through stage 4 chronic kidney disease, or unspecified chronic kidney disease: Secondary | ICD-10-CM | POA: Diagnosis present

## 2020-06-18 DIAGNOSIS — K8051 Calculus of bile duct without cholangitis or cholecystitis with obstruction: Secondary | ICD-10-CM | POA: Diagnosis not present

## 2020-06-18 DIAGNOSIS — K219 Gastro-esophageal reflux disease without esophagitis: Secondary | ICD-10-CM

## 2020-06-18 DIAGNOSIS — E162 Hypoglycemia, unspecified: Secondary | ICD-10-CM | POA: Diagnosis present

## 2020-06-18 DIAGNOSIS — Z20822 Contact with and (suspected) exposure to covid-19: Secondary | ICD-10-CM | POA: Diagnosis present

## 2020-06-18 DIAGNOSIS — N1832 Chronic kidney disease, stage 3b: Secondary | ICD-10-CM | POA: Diagnosis present

## 2020-06-18 DIAGNOSIS — N400 Enlarged prostate without lower urinary tract symptoms: Secondary | ICD-10-CM | POA: Diagnosis present

## 2020-06-18 DIAGNOSIS — B9689 Other specified bacterial agents as the cause of diseases classified elsewhere: Secondary | ICD-10-CM | POA: Diagnosis present

## 2020-06-18 DIAGNOSIS — Z9049 Acquired absence of other specified parts of digestive tract: Secondary | ICD-10-CM | POA: Diagnosis not present

## 2020-06-18 DIAGNOSIS — Z8719 Personal history of other diseases of the digestive system: Secondary | ICD-10-CM

## 2020-06-18 DIAGNOSIS — K5750 Diverticulosis of both small and large intestine without perforation or abscess without bleeding: Secondary | ICD-10-CM | POA: Diagnosis present

## 2020-06-18 DIAGNOSIS — Z1611 Resistance to penicillins: Secondary | ICD-10-CM | POA: Diagnosis present

## 2020-06-18 DIAGNOSIS — G8929 Other chronic pain: Secondary | ICD-10-CM | POA: Diagnosis present

## 2020-06-18 DIAGNOSIS — Z87891 Personal history of nicotine dependence: Secondary | ICD-10-CM

## 2020-06-18 DIAGNOSIS — Z7901 Long term (current) use of anticoagulants: Secondary | ICD-10-CM | POA: Diagnosis not present

## 2020-06-18 DIAGNOSIS — N179 Acute kidney failure, unspecified: Secondary | ICD-10-CM | POA: Diagnosis present

## 2020-06-18 DIAGNOSIS — Z9981 Dependence on supplemental oxygen: Secondary | ICD-10-CM

## 2020-06-18 DIAGNOSIS — I4891 Unspecified atrial fibrillation: Secondary | ICD-10-CM | POA: Diagnosis not present

## 2020-06-18 DIAGNOSIS — F419 Anxiety disorder, unspecified: Secondary | ICD-10-CM | POA: Diagnosis present

## 2020-06-18 DIAGNOSIS — M79601 Pain in right arm: Secondary | ICD-10-CM | POA: Diagnosis present

## 2020-06-18 DIAGNOSIS — A419 Sepsis, unspecified organism: Principal | ICD-10-CM | POA: Diagnosis present

## 2020-06-18 DIAGNOSIS — R609 Edema, unspecified: Secondary | ICD-10-CM | POA: Diagnosis not present

## 2020-06-18 DIAGNOSIS — R778 Other specified abnormalities of plasma proteins: Secondary | ICD-10-CM | POA: Diagnosis not present

## 2020-06-18 DIAGNOSIS — J988 Other specified respiratory disorders: Secondary | ICD-10-CM

## 2020-06-18 DIAGNOSIS — I350 Nonrheumatic aortic (valve) stenosis: Secondary | ICD-10-CM | POA: Diagnosis not present

## 2020-06-18 DIAGNOSIS — R7989 Other specified abnormal findings of blood chemistry: Secondary | ICD-10-CM | POA: Diagnosis not present

## 2020-06-18 DIAGNOSIS — G4733 Obstructive sleep apnea (adult) (pediatric): Secondary | ICD-10-CM | POA: Diagnosis present

## 2020-06-18 DIAGNOSIS — K8020 Calculus of gallbladder without cholecystitis without obstruction: Secondary | ICD-10-CM

## 2020-06-18 DIAGNOSIS — J441 Chronic obstructive pulmonary disease with (acute) exacerbation: Secondary | ICD-10-CM

## 2020-06-18 DIAGNOSIS — K8309 Other cholangitis: Secondary | ICD-10-CM | POA: Diagnosis not present

## 2020-06-18 DIAGNOSIS — R059 Cough, unspecified: Secondary | ICD-10-CM

## 2020-06-18 HISTORY — DX: Personal history of pulmonary embolism: Z86.711

## 2020-06-18 HISTORY — DX: Obstructive sleep apnea (adult) (pediatric): G47.33

## 2020-06-18 LAB — I-STAT CHEM 8, ED
BUN: 25 mg/dL — ABNORMAL HIGH (ref 8–23)
Calcium, Ion: 1.05 mmol/L — ABNORMAL LOW (ref 1.15–1.40)
Chloride: 93 mmol/L — ABNORMAL LOW (ref 98–111)
Creatinine, Ser: 2.1 mg/dL — ABNORMAL HIGH (ref 0.61–1.24)
Glucose, Bld: 58 mg/dL — ABNORMAL LOW (ref 70–99)
HCT: 34 % — ABNORMAL LOW (ref 39.0–52.0)
Hemoglobin: 11.6 g/dL — ABNORMAL LOW (ref 13.0–17.0)
Potassium: 3.9 mmol/L (ref 3.5–5.1)
Sodium: 130 mmol/L — ABNORMAL LOW (ref 135–145)
TCO2: 24 mmol/L (ref 22–32)

## 2020-06-18 LAB — URINALYSIS, ROUTINE W REFLEX MICROSCOPIC
Bacteria, UA: NONE SEEN
Glucose, UA: NEGATIVE mg/dL
Ketones, ur: NEGATIVE mg/dL
Leukocytes,Ua: NEGATIVE
Nitrite: NEGATIVE
Protein, ur: 30 mg/dL — AB
Specific Gravity, Urine: 1.035 — ABNORMAL HIGH (ref 1.005–1.030)
pH: 5 (ref 5.0–8.0)

## 2020-06-18 LAB — CBC WITH DIFFERENTIAL/PLATELET
Abs Immature Granulocytes: 0.3 10*3/uL — ABNORMAL HIGH (ref 0.00–0.07)
Band Neutrophils: 5 %
Basophils Absolute: 0 10*3/uL (ref 0.0–0.1)
Basophils Relative: 0 %
Eosinophils Absolute: 0 10*3/uL (ref 0.0–0.5)
Eosinophils Relative: 0 %
HCT: 33.7 % — ABNORMAL LOW (ref 39.0–52.0)
Hemoglobin: 11 g/dL — ABNORMAL LOW (ref 13.0–17.0)
Lymphocytes Relative: 2 %
Lymphs Abs: 0.3 10*3/uL — ABNORMAL LOW (ref 0.7–4.0)
MCH: 33.2 pg (ref 26.0–34.0)
MCHC: 32.6 g/dL (ref 30.0–36.0)
MCV: 101.8 fL — ABNORMAL HIGH (ref 80.0–100.0)
Metamyelocytes Relative: 2 %
Monocytes Absolute: 0.1 10*3/uL (ref 0.1–1.0)
Monocytes Relative: 1 %
Neutro Abs: 12.4 10*3/uL — ABNORMAL HIGH (ref 1.7–7.7)
Neutrophils Relative %: 90 %
Platelets: 163 10*3/uL (ref 150–400)
RBC: 3.31 MIL/uL — ABNORMAL LOW (ref 4.22–5.81)
RDW: 14.8 % (ref 11.5–15.5)
WBC: 13.1 10*3/uL — ABNORMAL HIGH (ref 4.0–10.5)
nRBC: 0 % (ref 0.0–0.2)

## 2020-06-18 LAB — CBG MONITORING, ED
Glucose-Capillary: 83 mg/dL (ref 70–99)
Glucose-Capillary: 75 mg/dL (ref 70–99)

## 2020-06-18 LAB — RESP PANEL BY RT-PCR (FLU A&B, COVID) ARPGX2
Influenza A by PCR: NEGATIVE
Influenza B by PCR: NEGATIVE
SARS Coronavirus 2 by RT PCR: NEGATIVE

## 2020-06-18 LAB — COMPREHENSIVE METABOLIC PANEL
ALT: 65 U/L — ABNORMAL HIGH (ref 0–44)
AST: 145 U/L — ABNORMAL HIGH (ref 15–41)
Albumin: 2.5 g/dL — ABNORMAL LOW (ref 3.5–5.0)
Alkaline Phosphatase: 199 U/L — ABNORMAL HIGH (ref 38–126)
Anion gap: 14 (ref 5–15)
BUN: 27 mg/dL — ABNORMAL HIGH (ref 8–23)
CO2: 23 mmol/L (ref 22–32)
Calcium: 8.1 mg/dL — ABNORMAL LOW (ref 8.9–10.3)
Chloride: 94 mmol/L — ABNORMAL LOW (ref 98–111)
Creatinine, Ser: 2.04 mg/dL — ABNORMAL HIGH (ref 0.61–1.24)
GFR, Estimated: 34 mL/min — ABNORMAL LOW (ref >60)
Glucose, Bld: 61 mg/dL — ABNORMAL LOW (ref 70–99)
Potassium: 3.9 mmol/L (ref 3.5–5.1)
Sodium: 131 mmol/L — ABNORMAL LOW (ref 135–145)
Total Bilirubin: 4.1 mg/dL — ABNORMAL HIGH (ref 0.3–1.2)
Total Protein: 5.9 g/dL — ABNORMAL LOW (ref 6.5–8.1)

## 2020-06-18 LAB — LACTIC ACID, PLASMA
Lactic Acid, Venous: 3.2 mmol/L (ref 0.5–1.9)
Lactic Acid, Venous: 2.9 mmol/L (ref 0.5–1.9)
Lactic Acid, Venous: 1.6 mmol/L (ref 0.5–1.9)

## 2020-06-18 LAB — LIPASE, BLOOD: Lipase: 37 U/L (ref 11–51)

## 2020-06-18 MED ORDER — ALBUTEROL SULFATE (2.5 MG/3ML) 0.083% IN NEBU: 2.5000 mg | INHALATION_SOLUTION | RESPIRATORY_TRACT | Status: DC | PRN

## 2020-06-18 MED ORDER — VANCOMYCIN HCL IN DEXTROSE 1-5 GM/200ML-% IV SOLN
1000.0000 mg | Freq: Once | INTRAVENOUS | Status: DC
  Filled 2020-06-18: qty 200

## 2020-06-18 MED ORDER — FLUTICASONE PROPIONATE 50 MCG/ACT NA SUSP
1.0000 | Freq: Every day | NASAL | Status: DC | PRN
  Filled 2020-06-18: qty 16

## 2020-06-18 MED ORDER — HYDROMORPHONE HCL 1 MG/ML IJ SOLN
0.5000 mg | INTRAMUSCULAR | Status: DC | PRN
Start: 2020-06-18 — End: 2020-06-22

## 2020-06-18 MED ORDER — VITAMIN B-12 1000 MCG PO TABS
1000.0000 ug | ORAL_TABLET | Freq: Every day | ORAL | Status: DC
  Administered 2020-06-18 – 2020-06-25 (×8): 1000 ug via ORAL
  Filled 2020-06-18 (×8): qty 1

## 2020-06-18 MED ORDER — PANTOPRAZOLE SODIUM 40 MG PO TBEC
40.0000 mg | DELAYED_RELEASE_TABLET | Freq: Every day | ORAL | Status: DC
  Administered 2020-06-18 – 2020-06-25 (×8): 40 mg via ORAL
  Filled 2020-06-18 (×8): qty 1

## 2020-06-18 MED ORDER — ONDANSETRON HCL 4 MG PO TABS
4.0000 mg | ORAL_TABLET | Freq: Four times a day (QID) | ORAL | Status: DC | PRN
  Administered 2020-06-24: 4 mg via ORAL
  Filled 2020-06-18: qty 1

## 2020-06-18 MED ORDER — LACTATED RINGERS IV SOLN: INTRAVENOUS | Status: DC

## 2020-06-18 MED ORDER — ROFLUMILAST 500 MCG PO TABS
250.0000 ug | ORAL_TABLET | Freq: Every day | ORAL | Status: DC
  Administered 2020-06-18 – 2020-06-25 (×8): 250 ug via ORAL
  Filled 2020-06-18 (×8): qty 1

## 2020-06-18 MED ORDER — GABAPENTIN 300 MG PO CAPS
300.0000 mg | ORAL_CAPSULE | Freq: Three times a day (TID) | ORAL | Status: DC
  Administered 2020-06-18 – 2020-06-25 (×26): 300 mg via ORAL
  Filled 2020-06-18 (×26): qty 1

## 2020-06-18 MED ORDER — HYDROMORPHONE HCL 1 MG/ML IJ SOLN
0.5000 mg | INTRAMUSCULAR | Status: DC | PRN
  Administered 2020-06-18: 0.5 mg via INTRAVENOUS
  Filled 2020-06-18: qty 1

## 2020-06-18 MED ORDER — CEFEPIME HCL 2 G IJ SOLR
2.0000 g | Freq: Once | INTRAMUSCULAR | Status: AC
  Administered 2020-06-18: 2 g via INTRAVENOUS
  Filled 2020-06-18: qty 2

## 2020-06-18 MED ORDER — CLONAZEPAM 1 MG PO TABS
1.0000 mg | ORAL_TABLET | Freq: Every evening | ORAL | Status: DC | PRN
  Administered 2020-06-18: 1 mg via ORAL
  Filled 2020-06-18: qty 2

## 2020-06-18 MED ORDER — FLUTICASONE FUROATE-VILANTEROL 100-25 MCG/INH IN AEPB
1.0000 | INHALATION_SPRAY | Freq: Every day | RESPIRATORY_TRACT | Status: DC
  Administered 2020-06-19 – 2020-06-25 (×7): 1 via RESPIRATORY_TRACT
  Filled 2020-06-18 (×2): qty 28

## 2020-06-18 MED ORDER — TAMSULOSIN HCL 0.4 MG PO CAPS
0.4000 mg | ORAL_CAPSULE | Freq: Every day | ORAL | Status: DC
  Administered 2020-06-18 – 2020-06-25 (×8): 0.4 mg via ORAL
  Filled 2020-06-18 (×8): qty 1

## 2020-06-18 MED ORDER — UMECLIDINIUM BROMIDE 62.5 MCG/INH IN AEPB
1.0000 | INHALATION_SPRAY | Freq: Every day | RESPIRATORY_TRACT | Status: DC
  Administered 2020-06-21 – 2020-06-25 (×5): 1 via RESPIRATORY_TRACT
  Filled 2020-06-18 (×2): qty 7

## 2020-06-18 MED ORDER — DONEPEZIL HCL 10 MG PO TABS
10.0000 mg | ORAL_TABLET | Freq: Every day | ORAL | Status: DC
  Administered 2020-06-18 – 2020-06-25 (×8): 10 mg via ORAL
  Filled 2020-06-18: qty 2
  Filled 2020-06-18 (×7): qty 1

## 2020-06-18 MED ORDER — DEXTROSE 50 % IV SOLN
25.0000 mL | Freq: Once | INTRAVENOUS | Status: AC
  Administered 2020-06-18: 25 mL via INTRAVENOUS
  Filled 2020-06-18: qty 50

## 2020-06-18 MED ORDER — VANCOMYCIN HCL 1500 MG/300ML IV SOLN
1500.0000 mg | Freq: Once | INTRAVENOUS | Status: AC
  Administered 2020-06-18: 1500 mg via INTRAVENOUS
  Filled 2020-06-18: qty 300

## 2020-06-18 MED ORDER — LACTATED RINGERS IV BOLUS: 1000.0000 mL | Freq: Once | INTRAVENOUS | Status: DC

## 2020-06-18 MED ORDER — ONDANSETRON HCL 4 MG/2ML IJ SOLN
4.0000 mg | Freq: Four times a day (QID) | INTRAMUSCULAR | Status: DC | PRN
  Administered 2020-06-18: 4 mg via INTRAVENOUS
  Filled 2020-06-18: qty 2

## 2020-06-18 MED ORDER — PIPERACILLIN-TAZOBACTAM 3.375 G IVPB
3.3750 g | Freq: Three times a day (TID) | INTRAVENOUS | Status: DC
  Administered 2020-06-19 – 2020-06-20 (×5): 3.375 g via INTRAVENOUS
  Filled 2020-06-18 (×7): qty 50

## 2020-06-18 MED ORDER — LACTATED RINGERS IV BOLUS (SEPSIS)
1000.0000 mL | Freq: Once | INTRAVENOUS | Status: AC
  Administered 2020-06-18: 1000 mL via INTRAVENOUS

## 2020-06-18 MED ORDER — DEXTROSE IN LACTATED RINGERS 5 % IV SOLN: INTRAVENOUS | Status: DC

## 2020-06-18 MED ORDER — SUMATRIPTAN SUCCINATE 6 MG/0.5ML ~~LOC~~ SOLN
6.0000 mg | Freq: Once | SUBCUTANEOUS | Status: AC
  Administered 2020-06-18: 6 mg via SUBCUTANEOUS
  Filled 2020-06-18: qty 0.5

## 2020-06-18 MED ORDER — METRONIDAZOLE IN NACL 5-0.79 MG/ML-% IV SOLN
500.0000 mg | Freq: Once | INTRAVENOUS | Status: AC
  Administered 2020-06-18: 500 mg via INTRAVENOUS
  Filled 2020-06-18: qty 100

## 2020-06-18 MED ORDER — FENTANYL CITRATE (PF) 100 MCG/2ML IJ SOLN
50.0000 ug | Freq: Once | INTRAMUSCULAR | Status: AC
Start: 2020-06-18 — End: 2020-06-18
  Administered 2020-06-18: 50 ug via INTRAVENOUS
  Filled 2020-06-18: qty 2

## 2020-06-18 MED ORDER — IOHEXOL 350 MG/ML SOLN
100.0000 mL | Freq: Once | INTRAVENOUS | Status: AC | PRN
  Administered 2020-06-18: 80 mL via INTRAVENOUS

## 2020-06-18 MED ORDER — HYDROMORPHONE HCL 1 MG/ML IJ SOLN
1.0000 mg | INTRAMUSCULAR | Status: DC | PRN
Start: 2020-06-18 — End: 2020-06-22
  Administered 2020-06-18 – 2020-06-21 (×3): 1 mg via INTRAVENOUS
  Filled 2020-06-18 (×3): qty 1

## 2020-06-18 MED ORDER — SODIUM CHLORIDE 0.9 % IV BOLUS
500.0000 mL | Freq: Once | INTRAVENOUS | Status: AC
  Administered 2020-06-18: 500 mL via INTRAVENOUS

## 2020-06-18 MED ORDER — FENTANYL CITRATE (PF) 100 MCG/2ML IJ SOLN
50.0000 ug | Freq: Once | INTRAMUSCULAR | Status: AC
  Administered 2020-06-18: 50 ug via INTRAVENOUS
  Filled 2020-06-18: qty 2

## 2020-06-18 NOTE — Progress Notes (Signed)
Patient ID: Warren [name], male   DOB: 31-Dec-1950, 70 y.o.   MRN: [id number] GI consult requested by Dr. Tamera Punt. 70 year old male with epigastric pain, elevated LFTs with CTAP showing 6 mm stone at the ampulla, with significant common bile duct and main pancreatic duct dilatation. No discrete pancreatic mass. Our GI service will see patient in am 4/8. Patient to remain NPO for possible ERCP. Please call our on call service if needed this evening or over night.

## 2020-06-18 NOTE — Progress Notes (Signed)
A consult was received from an ED physician for Vancomycin, Cefepime per pharmacy dosing.  The patient's profile has been reviewed for ht/wt/allergies/indication/available labs.   A one time order has been placed for Vancomycin 1500 mg, Cefepime 2g.  Further antibiotics/pharmacy consults should be ordered by admitting physician if indicated.                       Thank you, Gretta Arab PharmD, BCPS Clinical Pharmacist [facility] main pharmacy [phone] 06/18/2020 4:01 PM

## 2020-06-18 NOTE — Progress Notes (Signed)
Pharmacy Antibiotic Note  Warren [name] is a 70 y.o. male admitted on 06/18/2020 with IAI.  Pharmacy has been consulted for Zosyn dosing.  Plan: Zosyn 3.375g IV q8h (4 hour infusion).  Will sign off  Height: 5\' 11"  (180.3 cm) Weight: 86.2 kg (190 lb) IBW/kg (Calculated) : 75.3  Temp (24hrs), Avg:99.2 F (37.3 C), Min:98.3 F (36.8 C), Max:100.1 F (37.8 C)  Recent Labs  Lab 06/18/20 1353 06/18/20 1407 06/18/20 1411  WBC 13.1*  --   --   CREATININE 2.04* 2.10*  --   LATICACIDVEN  --   --  3.2*    Estimated Creatinine Clearance: 34.4 mL/min (A) (by C-G formula based on SCr of 2.1 mg/dL (H)).    No Known Allergies   Thank you for allowing pharmacy to be a part of this patient's care.  Kara Mead 06/18/2020 5:19 PM

## 2020-06-18 NOTE — ED Notes (Signed)
Lactic acid 3.2, provider notified.

## 2020-06-18 NOTE — ED Provider Notes (Signed)
[facility] DEPT Provider Note   CSN: [id number] Arrival date & time: 06/18/20  1244     History Chief Complaint  Patient presents with  . Abdominal Pain    Warren [name] is a 70 y.o. male.  Patient is a 70 year old male with a history of prior pancreatitis, COPD, obstructive sleep apnea and prior PE who presents with abdominal pain.  He says he has had a 2-week history of some pain across his upper abdomen.  Initially it felt like his pancreatitis.  He tried to treat it at home with clear fluids but it has progressively gotten worse.  Today he started having pain up into his left shoulder and upper back.  He has not had that previously with his pancreatitis.  He has worsening pain across his upper abdomen.  His shoulder pain seems to be constant and he does not report that it is worse with movement.  He has shortness of breath at baseline and uses oxygen typically only at night but his shortness of breath is slightly worse today.  He has a cough and some chest congestion that started yesterday.  No known fevers.  No pleuritic type chest pain.  No leg pain or swelling.  Has had some nausea and vomiting.  He had some loose stools this morning.  No urinary symptoms.  He saw his PCP at [facility] for the symptoms and reportedly had an ultrasound a few days ago but he did not get a report of any abnormal findings.  After he had walked to the stretcher with EMS, he became hypotensive with a blood pressure in the 88C systolic.  He was given a 500 cc fluid bolus with improvement in symptoms.        Past Medical History:  Diagnosis Date  . Allergy   . COPD (chronic obstructive pulmonary disease) ([place])   . GERD (gastroesophageal reflux disease)   . Hypertension   . Insomnia     Patient Active Problem List   Diagnosis Date Noted  . Choledocholithiasis with obstruction 06/18/2020  . ANXIETY 03/22/2007  . TOBACCO ABUSE 03/22/2007  . COPD 03/22/2007  . GERD  03/22/2007  . TRANSAMINASES, SERUM, ELEVATED 03/22/2007  . PANCREATITIS, HX OF 03/22/2007    The histories are not reviewed yet. Please review them in the "History" navigator section and refresh this [place].     Family History  Problem Relation Age of Onset  . Colon cancer Neg Hx        Home Medications Prior to Admission medications   Medication Sig Start Date End Date Taking? Authorizing Provider  albuterol (VENTOLIN HFA) 108 (90 Base) MCG/ACT inhaler Inhale 2 puffs into the lungs every 4 (four) hours as needed for wheezing or shortness of breath. 06/03/20  Yes [provider]  clonazePAM (KLONOPIN) 1 MG tablet Take 1 mg by mouth at bedtime as needed for anxiety. 06/08/20  Yes [provider]  cyanocobalamin 1000 MCG tablet Take 1,000 mcg by mouth daily.   Yes [provider]  DALIRESP 250 MCG TABS Take 250 mcg by mouth daily. 06/17/20  Yes [provider]  donepezil (ARICEPT) 10 MG tablet Take 10 mg by mouth daily. 05/20/20  Yes [provider]  famotidine (PEPCID) 20 MG tablet Take 20 mg by mouth daily.   Yes [provider]  fluticasone (FLONASE) 50 MCG/ACT nasal spray Place 1 spray into both nostrils daily as needed for allergies or rhinitis. 04/08/20  Yes [provider]  Fluticasone-Umeclidin-Vilant 100-62.5-25 MCG/INH AEPB Inhale 1 puff into the lungs daily.   Yes [provider]  gabapentin (NEURONTIN) 300 MG capsule Take 300 mg by mouth 4 (four) times daily.   Yes [provider]  HYDROcodone-acetaminophen (NORCO) 10-325 MG tablet Take 1 tablet by mouth every 6 (six) hours as needed for severe pain.   Yes [provider]  Iron-Folic Acid-Vit E70 (IRON FORMULA PO) Take 81 mg by mouth 2 (two) times daily.   Yes [provider]  mometasone (NASONEX) 50 MCG/ACT nasal spray Place 2 sprays into the nose daily.   Yes [provider]  nystatin cream (MYCOSTATIN) Apply 1  application topically 2 (two) times daily. 06/08/20  Yes [provider]  omeprazole (PRILOSEC) 40 MG capsule Take 1 capsule (40 mg total) by mouth daily. 11/21/16  Yes Armbruster, Carlota Raspberry, MD  potassium chloride SA (KLOR-CON) 20 MEQ tablet Take 20 mEq by mouth daily. 05/21/20  Yes [provider]  tamsulosin (FLOMAX) 0.4 MG CAPS capsule Take 0.4 mg by mouth daily. 03/18/16  Yes [provider]  triamcinolone ointment (KENALOG) 0.1 % Apply 1 application topically daily as needed. 04/13/20  Yes [provider]  VITAMIN D, ERGOCALCIFEROL, PO Take 5,000 Units by mouth daily.   Yes [provider]    Allergies    Patient has no known allergies.  Review of Systems   Review of Systems  Constitutional: Positive for fatigue. Negative for chills, diaphoresis and fever.  HENT: Negative for congestion, rhinorrhea and sneezing.   Eyes: Negative.   Respiratory: Negative for cough, chest tightness and shortness of breath.   Cardiovascular: Negative for chest pain and leg swelling.  Gastrointestinal: Positive for abdominal pain, diarrhea (Loose stools today), nausea and vomiting. Negative for blood in stool.  Genitourinary: Negative for difficulty urinating, flank pain, frequency and hematuria.  Musculoskeletal: Positive for arthralgias and back pain.  Skin: Negative for rash.  Neurological: Negative for dizziness, speech difficulty, weakness, numbness and headaches.    Physical Exam Updated Vital Signs BP 103/60   Pulse 95   Temp 100.1 F (37.8 C) (Rectal)   Resp 14   Ht 5\' 11"  (1.803 m)   Wt 86.2 kg   SpO2 96%   BMI 26.50 kg/m   Physical Exam Constitutional:      Appearance: He is well-developed.  HENT:     Head: Normocephalic and atraumatic.  Eyes:     Pupils: Pupils are equal, round, and reactive to light.  Cardiovascular:     Rate and Rhythm: Regular rhythm. Tachycardia present.     Heart sounds: Normal heart sounds.  Pulmonary:      Effort: Pulmonary effort is normal. No respiratory distress.     Breath sounds: Normal breath sounds. No wheezing or rales.     Comments: Tachypnea Chest:     Chest wall: No tenderness.  Abdominal:     General: Bowel sounds are normal.     Palpations: Abdomen is soft.     Tenderness: There is abdominal tenderness (Tenderness across the upper abdomen with some voluntary guarding). There is no guarding or rebound.  Musculoskeletal:        General: Normal range of motion.     Cervical back: Normal range of motion and neck supple.     Comments: There is some tenderness on range of motion of the left shoulder, there is also some tenderness on palpation of the musculature of the left neck  Lymphadenopathy:     Cervical:  No cervical adenopathy.  Skin:    General: Skin is warm and dry.     Findings: No rash.  Neurological:     Mental Status: He is alert and oriented to person, place, and time.     ED Results / Procedures / Treatments   Labs (all labs ordered are listed, but only abnormal results are displayed) Labs Reviewed  COMPREHENSIVE METABOLIC PANEL - Abnormal; Notable for the following components:      Result Value   Sodium 131 (*)    Chloride 94 (*)    Glucose, Bld 61 (*)    BUN 27 (*)    Creatinine, Ser 2.04 (*)    Calcium 8.1 (*)    Total Protein 5.9 (*)    Albumin 2.5 (*)    AST 145 (*)    ALT 65 (*)    Alkaline Phosphatase 199 (*)    Total Bilirubin 4.1 (*)    GFR, Estimated 34 (*)    All other components within normal limits  CBC WITH DIFFERENTIAL/PLATELET - Abnormal; Notable for the following components:   WBC 13.1 (*)    RBC 3.31 (*)    Hemoglobin 11.0 (*)    HCT 33.7 (*)    MCV 101.8 (*)    Neutro Abs 12.4 (*)    Lymphs Abs 0.3 (*)    Abs Immature Granulocytes 0.30 (*)    All other components within normal limits  LACTIC ACID, PLASMA - Abnormal; Notable for the following components:   Lactic Acid, Venous 3.2 (*)    All other components within normal  limits  I-STAT CHEM 8, ED - Abnormal; Notable for the following components:   Sodium 130 (*)    Chloride 93 (*)    BUN 25 (*)    Creatinine, Ser 2.10 (*)    Glucose, Bld 58 (*)    Calcium, Ion 1.05 (*)    Hemoglobin 11.6 (*)    HCT 34.0 (*)    All other components within normal limits  CULTURE, BLOOD (ROUTINE X 2)  CULTURE, BLOOD (ROUTINE X 2)  RESP PANEL BY RT-PCR (FLU A&B, COVID) ARPGX2  LIPASE, BLOOD  URINALYSIS, ROUTINE W REFLEX MICROSCOPIC  LACTIC ACID, PLASMA  CBG MONITORING, ED    EKG EKG Interpretation  Date/Time:  Thursday June 18 2020 13:46:01 EDT Ventricular Rate:  107 PR Interval:  168 QRS Duration: 95 QT Interval:  331 QTC Calculation: 442 R Axis:   -3 Text Interpretation: Sinus tachycardia Borderline T abnormalities, anterior leads Minimal ST elevation, inferior leads SINCE LAST TRACING HEART RATE HAS INCREASED Confirmed by Malvin Johns [phone]) on 06/18/2020 1:51:17 PM   Radiology DG Chest Port 1 View  Result Date: 06/18/2020 CLINICAL DATA:  Cough and short of breath EXAM: PORTABLE CHEST 1 VIEW COMPARISON:  05/08/2020 FINDINGS: COPD with pulmonary scarring similar to the prior study. Mild left lower lobe airspace disease is new since the prior study. Possible small left effusion. IMPRESSION: Mild left lower lobe atelectasis/infiltrate. Possible small left effusion COPD with scarring. Electronically Signed   By: Franchot Gallo M.D.   On: 06/18/2020 14:13   CT Angio Chest/Abd/Pel for Dissection W and/or W/WO  Result Date: 06/18/2020 CLINICAL DATA:  Epigastric abdominal pain radiating to the upper back and shoulder. Hypotension. EXAM: CT ANGIOGRAPHY CHEST, ABDOMEN AND PELVIS TECHNIQUE: Non-contrast CT of the chest was initially obtained. Multidetector CT imaging through the chest, abdomen and pelvis was performed using the standard protocol during bolus administration of intravenous contrast. Multiplanar reconstructed images  and MIPs were obtained and reviewed to  evaluate the vascular anatomy. CONTRAST:  56mL OMNIPAQUE IOHEXOL 350 MG/ML SOLN COMPARISON:  05/27/2016 chest CT angiogram. 08/01/2016 PET-CT. 58099 CT abdomen/pelvis. FINDINGS: CTA CHEST FINDINGS Motion degraded scan limits assessment. Cardiovascular: Normal heart size. No significant pericardial effusion/thickening. Three-vessel coronary atherosclerosis. Patent aortic arch branch vessels. Atherosclerotic nonaneurysmal thoracic aorta. No acute intramural hematoma, dissection, pseudoaneurysm or penetrating atherosclerotic ulcer in the thoracic aorta. Top-normal caliber main pulmonary artery (3.4 cm diameter). No central pulmonary emboli. Mediastinum/Nodes: No discrete thyroid nodules. Unremarkable esophagus. No pathologically enlarged axillary, mediastinal or hilar lymph nodes. Lungs/Pleura: No pneumothorax. No pleural effusion. Severe centrilobular and paraseptal emphysema with diffuse bronchial wall thickening. No acute consolidative airspace disease, lung masses or significant pulmonary nodules. Musculoskeletal: No aggressive appearing focal osseous lesions. Mild thoracic spondylosis. Review of the MIP images confirms the above findings. CTA ABDOMEN AND PELVIS FINDINGS VASCULAR Aorta: Atherosclerotic nonaneurysmal abdominal aorta with no dissection or significant stenosis. Celiac: Approximately 60% proximal atherosclerotic stenosis. No aneurysm or dissection. SMA: Patent without evidence of aneurysm, dissection, vasculitis or significant stenosis. Renals: Approximately 50% atherosclerotic proximal stenoses in the bilateral renal arteries. IMA: Patent without evidence of aneurysm, dissection, vasculitis or significant stenosis. Inflow: Focal high-grade atherosclerotic stenosis in the right common femoral artery (series 6/image 187). Otherwise patent without dissection or aneurysm. Veins: No obvious venous abnormality within the limitations of this arterial phase study. Review of the MIP images confirms the above  findings. NON-VASCULAR Hepatobiliary: Normal liver with no liver mass. Cholecystectomy. There is apparent 6 mm stone at the ampulla (series 6/image 130). Common bile duct diameter 15 mm. Mild diffuse central intrahepatic biliary ductal dilatation. Pancreas: Newly dilated main pancreatic duct (5 mm diameter). No discrete pancreatic mass. Spleen: Normal size. No mass. Adrenals/Urinary Tract: Normal adrenals. No hydronephrosis. No contour deforming renal masses. Normal bladder. Stomach/Bowel: Small hiatal hernia. Otherwise normal nondistended stomach. Normal caliber small bowel with no small bowel wall thickening. Scattered tiny calcified stones in the otherwise normal appendix. Marked sigmoid diverticulosis with no large bowel wall thickening or significant pericolonic fat stranding. Vascular/Lymphatic: No pathologically enlarged lymph nodes in the abdomen or pelvis. Reproductive: Normal size prostate. Other: No pneumoperitoneum, ascites or focal fluid collection. Musculoskeletal: No aggressive appearing focal osseous lesions. Marked lumbar spondylosis. Review of the MIP images confirms the above findings. IMPRESSION: 1. No acute aortic syndrome. 2. Apparent 6 mm stone at the ampulla, with significant common bile duct and main pancreatic duct dilatation. No discrete pancreatic mass. Recommend correlation with serum bilirubin levels. GI consultation for consideration of ERCP suggested. 3. Focal high-grade atherosclerotic stenosis in the right common femoral artery. 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 Approximately 50% proximal bilateral renal artery stenoses. 4. Three-vessel coronary atherosclerosis. 5. Small hiatal hernia. 6. Marked sigmoid diverticulosis. 7. Aortic Atherosclerosis (ICD10-I70.0) and Emphysema (ICD10-J43.9). Electronically Signed   By: Ilona Sorrel M.D.   On: 06/18/2020 16:34    Procedures Procedures   Medications Ordered in ED Medications  lactated ringers infusion (has no  administration in time range)  lactated ringers bolus 1,000 mL (1,000 mLs Intravenous New Bag/Given 06/18/20 1634)  metroNIDAZOLE (FLAGYL) IVPB 500 mg (500 mg Intravenous New Bag/Given 06/18/20 1636)  vancomycin (VANCOREADY) IVPB 1500 mg/300 mL (has no administration in time range)  sodium chloride 0.9 % bolus 500 mL (0 mLs Intravenous Stopped 06/18/20 1507)  fentaNYL (SUBLIMAZE) injection 50 mcg (50 mcg Intravenous Given 06/18/20 1355)  dextrose 50 % solution 25 mL (25 mLs Intravenous Given 06/18/20 1507)  iohexol (OMNIPAQUE)  350 MG/ML injection 100 mL (80 mLs Intravenous Contrast Given 06/18/20 1528)  fentaNYL (SUBLIMAZE) injection 50 mcg (50 mcg Intravenous Given 06/18/20 1558)  ceFEPIme (MAXIPIME) 2 g in sodium chloride 0.9 % 100 mL IVPB (2 g Intravenous New Bag/Given 06/18/20 1637)    ED Course  I have reviewed the triage vital signs and the nursing notes.  Pertinent labs & imaging results that were available during my care of the patient were reviewed by me and considered in my medical decision making (see chart for details).    MDM Rules/Calculators/A&P                          Patient is a 70 year old male who presents with abdominal pain.  He had pain radiating up to his back and shoulder.  He has a rectal temp of 100.1.  His white count is elevated.  His lactate is elevated at 3.2.  His blood pressures been soft with systolic values in the 46K.  This has slightly improved with IV fluids with a systolic now in the 599J.  Following these values, code sepsis was initiated.  Patient was started on IV fluids and broad-spectrum antibiotics.  His blood sugar was on the low side.  He was given half amp of D50.  We will monitor this.  Given his radiation of the pain, CT angio of the chest abdomen pelvis was performed.  There is no evidence of aortic dissection.  There was evidence of a stone in the common bile duct at the ampulla with common bile duct dilatation and inflammation at the hepatic duct system.  He  is status post cholecystectomy.  I spoke with the PA on-call for low-power gastroenterology who will have Dr. Fuller Plan consult on the patient.  I spoke with the hospitalist, Dr. Florene Glen who will admit the patient.  CRITICAL CARE Performed by: Malvin Johns Total critical care time: 65 minutes Critical care time was exclusive of separately billable procedures and treating other patients. Critical care was necessary to treat or prevent imminent or life-threatening deterioration. Critical care was time spent personally by me on the following activities: development of treatment plan with patient and/or surrogate as well as nursing, discussions with consultants, evaluation of patient's response to treatment, examination of patient, obtaining history from patient or surrogate, ordering and performing treatments and interventions, ordering and review of laboratory studies, ordering and review of radiographic studies, pulse oximetry and re-evaluation of patient's condition.   Final Clinical Impression(s) / ED Diagnoses Final diagnoses:  Sepsis, due to unspecified organism, unspecified whether acute organ dysfunction present ([place])  Calculus of bile duct without cholecystitis with obstruction    Rx / DC Orders ED Discharge Orders    None       Malvin Johns, MD 06/18/20 1719

## 2020-06-18 NOTE — H&P (Signed)
History and Physical    Warren [name] YCX:[id number] DOB: January 11, 1951 DOA: 06/18/2020  PCP: Imagene Riches, NP Patient coming from: home  I have personally briefly reviewed patient's old medical records in [place]  Chief Complaint: abdominal pain  HPI: Warren [name] is Warren [name] 70 y.o. male with medical history significant of COPD on 3 L oxygen at night, GERD, HTN, pancreatitis, history of pulmonary embolism, s/p cholecystectomy presenting with 2.5 weeks of abdominal pain.  He notes pain started about 2.5 weeks ago.  Described as sharp, constant, radiating.  Epigastric in location.  He denies chest pain or shortness of breath.  Pain worse with eating.  He notes that when pain initially started, he stopped eating and the pain improved.  It resumed after he started eating again.  He presented today with worsening pain.  Notes nausea/vomiting x3-4 episodes.  No diarrhea.  No SOB.  Notes fevers.  Denies smoking or drinking.  Notes he had cholecystectomy years ago.     ED Course: Imaging notable for 6 mm stone at ampulla with significant common bile duct and main pancreatic duct dilatation.  Abx, IVF given with concern for sepsis 2/2 cholangitis.  GI consulted.  Admit to hospitalist.   Review of Systems: As per HPI otherwise all other systems reviewed and are negative.  Past Medical History:  Diagnosis Date  . Allergy   . COPD (chronic obstructive pulmonary disease) ([place])   . GERD (gastroesophageal reflux disease)   . History of pulmonary embolus (PE)   . Hypertension   . Insomnia     Past Surgical History:  Procedure Laterality Date  . CHOLECYSTECTOMY      Social History  reports that he has quit smoking. He does not have any smokeless tobacco history on file. He reports previous alcohol use. No history on file for drug use.  No Known Allergies  Family History  Problem Relation Age of Onset  . Colon cancer Neg Hx    Prior to Admission medications   Medication Sig Start Date  End Date Taking? Authorizing Provider  albuterol (VENTOLIN HFA) 108 (90 Base) MCG/ACT inhaler Inhale 2 puffs into the lungs every 4 (four) hours as needed for wheezing or shortness of breath. 06/03/20  Yes [provider]  clonazePAM (KLONOPIN) 1 MG tablet Take 1 mg by mouth at bedtime as needed for anxiety. 06/08/20  Yes [provider]  cyanocobalamin 1000 MCG tablet Take 1,000 mcg by mouth daily.   Yes [provider]  DALIRESP 250 MCG TABS Take 250 mcg by mouth daily. 06/17/20  Yes [provider]  donepezil (ARICEPT) 10 MG tablet Take 10 mg by mouth daily. 05/20/20  Yes [provider]  famotidine (PEPCID) 20 MG tablet Take 20 mg by mouth daily.   Yes [provider]  fluticasone (FLONASE) 50 MCG/ACT nasal spray Place 1 spray into both nostrils daily as needed for allergies or rhinitis. 04/08/20  Yes [provider]  Fluticasone-Umeclidin-Vilant 100-62.5-25 MCG/INH AEPB Inhale 1 puff into the lungs daily.   Yes [provider]  gabapentin (NEURONTIN) 300 MG capsule Take 300 mg by mouth 4 (four) times daily.   Yes [provider]  HYDROcodone-acetaminophen (NORCO) 10-325 MG tablet Take 1 tablet by mouth every 6 (six) hours as needed for severe pain.   Yes [provider]  Iron-Folic Acid-Vit S97 (IRON FORMULA PO) Take 81 mg by mouth 2 (two) times daily.   Yes [provider]  mometasone (NASONEX) 50  MCG/ACT nasal spray Place 2 sprays into the nose daily.   Yes [provider]  nystatin cream (MYCOSTATIN) Apply 1 application topically 2 (two) times daily. 06/08/20  Yes [provider]  omeprazole (PRILOSEC) 40 MG capsule Take 1 capsule (40 mg total) by mouth daily. 11/21/16  Yes Armbruster, Carlota Raspberry, MD  potassium chloride SA (KLOR-CON) 20 MEQ tablet Take 20 mEq by mouth daily. 05/21/20  Yes [provider]  tamsulosin (FLOMAX) 0.4 MG CAPS capsule Take 0.4 mg by mouth daily. 03/18/16   Yes [provider]  triamcinolone ointment (KENALOG) 0.1 % Apply 1 application topically daily as needed. 04/13/20  Yes [provider]  VITAMIN D, ERGOCALCIFEROL, PO Take 5,000 Units by mouth daily.   Yes [provider]    Physical Exam: Vitals:   06/18/20 1500 06/18/20 1554 06/18/20 1600 06/18/20 1645  BP: (!) 95/55  (!) 92/56 103/60  Pulse: (!) 105  98 95  Resp: (!) 22  (!) 30 14  Temp:  100.1 F (37.8 C)    TempSrc:  Rectal    SpO2: 92%  97% 96%  Weight:      Height:        Constitutional: NAD, calm, comfortable Vitals:   06/18/20 1500 06/18/20 1554 06/18/20 1600 06/18/20 1645  BP: (!) 95/55  (!) 92/56 103/60  Pulse: (!) 105  98 95  Resp: (!) 22  (!) 30 14  Temp:  100.1 F (37.8 C)    TempSrc:  Rectal    SpO2: 92%  97% 96%  Weight:      Height:       Eyes: mildly icteric Neck: normal, supple, no masses, no thyromegaly Respiratory: clear to auscultation bilaterally, no wheezing, no crackles Cardiovascular: Regular rate and rhythm, no murmurs / rubs / gallops. Trace edema. Palpable pedal pulses.  Abdomen: diffusely tender, soft, non distended Musculoskeletal: no clubbing / cyanosis. No joint deformity upper and lower extremities. Good ROM, no contractures. Normal muscle tone.  Skin: no rashes, lesions, ulcers. No induration Neurologic: CN 2-12 grossly intact. MAEx4 Psychiatric: Normal judgment and insight. Alert and oriented x 3. Normal mood.   Labs on Admission: I have personally reviewed following labs and imaging studies  CBC: Recent Labs  Lab 06/18/20 1353 06/18/20 1407  WBC 13.1*  --   NEUTROABS 12.4*  --   HGB 11.0* 11.6*  HCT 33.7* 34.0*  MCV 101.8*  --   PLT 163  --     Basic Metabolic Panel: Recent Labs  Lab 06/18/20 1353 06/18/20 1407  NA 131* 130*  K 3.9 3.9  CL 94* 93*  CO2 23  --   GLUCOSE 61* 58*  BUN 27* 25*  CREATININE 2.04* 2.10*  CALCIUM 8.1*  --     GFR: Estimated Creatinine Clearance: 34.4  mL/min (Revere Maahs) (by C-G formula based on SCr of 2.1 mg/dL (H)).  Liver Function Tests: Recent Labs  Lab 06/18/20 1353  AST 145*  ALT 65*  ALKPHOS 199*  BILITOT 4.1*  PROT 5.9*  ALBUMIN 2.5*    Urine analysis:    Component Value Date/Time   COLORURINE AMBER (Eldon Zietlow) 06/18/2020 1722   APPEARANCEUR HAZY (Maanya Hippert) 06/18/2020 1722   LABSPEC 1.035 (H) 06/18/2020 1722   PHURINE 5.0 06/18/2020 1722   GLUCOSEU NEGATIVE 06/18/2020 1722   HGBUR MODERATE (Jiyaan Steinhauser) 06/18/2020 1722   BILIRUBINUR SMALL (Lilyann Gravelle) 06/18/2020 1722   KETONESUR NEGATIVE 06/18/2020 1722   PROTEINUR 30 (Nicky Milhouse) 06/18/2020 1722   UROBILINOGEN 2.0 (H) 03/05/2007 6237  NITRITE NEGATIVE 06/18/2020 1722   LEUKOCYTESUR NEGATIVE 06/18/2020 1722    Radiological Exams on Admission: DG Chest Port 1 View  Result Date: 06/18/2020 CLINICAL DATA:  Cough and short of breath EXAM: PORTABLE CHEST 1 VIEW COMPARISON:  05/08/2020 FINDINGS: COPD with pulmonary scarring similar to the prior study. Mild left lower lobe airspace disease is new since the prior study. Possible small left effusion. IMPRESSION: Mild left lower lobe atelectasis/infiltrate. Possible small left effusion COPD with scarring. Electronically Signed   By: Franchot Gallo M.D.   On: 06/18/2020 14:13   CT Angio Chest/Abd/Pel for Dissection W and/or W/WO  Result Date: 06/18/2020 CLINICAL DATA:  Epigastric abdominal pain radiating to the upper back and shoulder. Hypotension. EXAM: CT ANGIOGRAPHY CHEST, ABDOMEN AND PELVIS TECHNIQUE: Non-contrast CT of the chest was initially obtained. Multidetector CT imaging through the chest, abdomen and pelvis was performed using the standard protocol during bolus administration of intravenous contrast. Multiplanar reconstructed images and MIPs were obtained and reviewed to evaluate the vascular anatomy. CONTRAST:  49m OMNIPAQUE IOHEXOL 350 MG/ML SOLN COMPARISON:  05/27/2016 chest CT angiogram. 08/01/2016 PET-CT. [id number] abdomen/pelvis. FINDINGS: CTA CHEST FINDINGS  Motion degraded scan limits assessment. Cardiovascular: Normal heart size. No significant pericardial effusion/thickening. Three-vessel coronary atherosclerosis. Patent aortic arch branch vessels. Atherosclerotic nonaneurysmal thoracic aorta. No acute intramural hematoma, dissection, pseudoaneurysm or penetrating atherosclerotic ulcer in the thoracic aorta. Top-normal caliber main pulmonary artery (3.4 cm diameter). No central pulmonary emboli. Mediastinum/Nodes: No discrete thyroid nodules. Unremarkable esophagus. No pathologically enlarged axillary, mediastinal or hilar lymph nodes. Lungs/Pleura: No pneumothorax. No pleural effusion. Severe centrilobular and paraseptal emphysema with diffuse bronchial wall thickening. No acute consolidative airspace disease, lung masses or significant pulmonary nodules. Musculoskeletal: No aggressive appearing focal osseous lesions. Mild thoracic spondylosis. Review of the MIP images confirms the above findings. CTA ABDOMEN AND PELVIS FINDINGS VASCULAR Aorta: Atherosclerotic nonaneurysmal abdominal aorta with no dissection or significant stenosis. Celiac: Approximately 60% proximal atherosclerotic stenosis. No aneurysm or dissection. SMA: Patent without evidence of aneurysm, dissection, vasculitis or significant stenosis. Renals: Approximately 50% atherosclerotic proximal stenoses in the bilateral renal arteries. IMA: Patent without evidence of aneurysm, dissection, vasculitis or significant stenosis. Inflow: Focal high-grade atherosclerotic stenosis in the right common femoral artery (series 6/image 187). Otherwise patent without dissection or aneurysm. Veins: No obvious venous abnormality within the limitations of this arterial phase study. Review of the MIP images confirms the above findings. NON-VASCULAR Hepatobiliary: Normal liver with no liver mass. Cholecystectomy. There is apparent 6 mm stone at the ampulla (series 6/image 130). Common bile duct diameter 15 mm. Mild  diffuse central intrahepatic biliary ductal dilatation. Pancreas: Newly dilated main pancreatic duct (5 mm diameter). No discrete pancreatic mass. Spleen: Normal size. No mass. Adrenals/Urinary Tract: Normal adrenals. No hydronephrosis. No contour deforming renal masses. Normal bladder. Stomach/Bowel: Small hiatal hernia. Otherwise normal nondistended stomach. Normal caliber small bowel with no small bowel wall thickening. Scattered tiny calcified stones in the otherwise normal appendix. Marked sigmoid diverticulosis with no large bowel wall thickening or significant pericolonic fat stranding. Vascular/Lymphatic: No pathologically enlarged lymph nodes in the abdomen or pelvis. Reproductive: Normal size prostate. Other: No pneumoperitoneum, ascites or focal fluid collection. Musculoskeletal: No aggressive appearing focal osseous lesions. Marked lumbar spondylosis. Review of the MIP images confirms the above findings. IMPRESSION: 1. No acute aortic syndrome. 2. Apparent 6 mm stone at the ampulla, with significant common bile duct and main pancreatic duct dilatation. No discrete pancreatic mass. Recommend correlation with serum bilirubin levels. GI consultation for consideration  of ERCP suggested. 3. Focal high-grade atherosclerotic stenosis in the right common femoral artery. 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 Approximately 50% proximal bilateral renal artery stenoses. 4. Three-vessel coronary atherosclerosis. 5. Small hiatal hernia. 6. Marked sigmoid diverticulosis. 7. Aortic Atherosclerosis (ICD10-I70.0) and Emphysema (ICD10-J43.9). Electronically Signed   By: Ilona Sorrel M.D.   On: 06/18/2020 16:34    EKG: Independently reviewed. Sinus tachy, no priors for comparison  Assessment/Plan Principal Problem:   Choledocholithiasis with obstruction Active Problems:   GERD (gastroesophageal reflux disease)   S/P cholecystectomy   History of pancreatitis   History of pulmonary embolism   COPD (chronic  obstructive pulmonary disease) ([facility])  Sepsis secondary to Cholangitis  Cholodocholithiasis  Abdominal Pain CT with 6 mm stone at ampulla with significant common bile duct and main pancreatic duct dilatation Lipase wnl.  Bilirubin 4.1, alk phos 199, AST 145, ALT 65.  Met criteria for sepsis with tachycardia, tachypnea, leukocytosis in setting of suspected cholangitis.  Borderline temp 100.1.   Blood cultures pending Continue broad spectrum abx Lactate elevated, improving with IVF, follow GI consult, appreciate recs - they plan to see in AM Analgesia, antiemetics prn  Acute Kidney Injury Baseline creatinine unclear, follow (in 2009, creatinine <1)  Hyponatremia Suspect hypovolemic, follow with IVF  Hypoglycemia Likely 2/2 NPO, follow with dextrose containing fluids  Hx COPD on 3 L at night  CXR with mild LLL atelecatasis/infiltrate, possible small L effusion -> CT chest without effusion, but notable for severe centrilobular and paraseptal emphysema with diffuse bronchial wall thickening Continue home meds with daliresp, trelegy ellipta   Hx PE Notes he took 6 months of anticoagulation, no longer on anticoagulation     BPH Continue flomax  Anxiety Continue klonopin nightly prn  GERD Continue PPI  Chronic Pain On norco 10 mg q6 hrs at home - currently receiving IV dilaudid Continue gabapentin   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 Inpatient  status is judged to be reasonable and necessary in order to provide the required intensity of service to ensure the patient's safety. The patient's presenting symptoms, physical exam findings, and initial radiographic and laboratory data in the context of their chronic comorbidities is felt to place them at high risk for further clinical deterioration. Furthermore, it is not anticipated that the patient will be medically stable for discharge from the hospital within 2 midnights of admission. The following factors support the patient status of inpatient.   " The patient's presenting symptoms include abdominal pain. " The worrisome physical exam findings include hypotension. " The initial radiographic and laboratory data are worrisome because of findings concerning for AKI, sepsis, cholangitis. " The chronic co-morbidities include COPD.   * I certify that at the point of admission it is my clinical judgment that the patient will require inpatient hospital care spanning beyond 2 midnights from the point of admission due to high intensity of service, high risk for further deterioration and high frequency of surveillance required.*  Fayrene Helper MD [facility]  How to contact the [facility] Attending or Consulting provider [place] or covering provider during after hours [place], for this patient?   1. Check the care team in [facility] and look for Jihad Brownlow) attending/consulting [facility] provider listed and b) the [facility] team listed 2. Log into [email or web address] and use [place] universal password to access. If you do not have the password, please contact the hospital operator. 3. Locate the [facility] provider you are looking for under [facility] and page to Paisleigh Maroney number that you can be directly reached. 4. If you still have difficulty reaching the provider, please page the [facility] (Director on Call) for the Hospitalists listed on amion for assistance.  06/18/2020, 6:29 PM

## 2020-06-18 NOTE — ED Triage Notes (Signed)
Pt came from home via EMS. C/c: radiating abdominal pain in epigastric region to left shoulder. PMH of pancreatitis. Pt states "abdominal pain feels like past flare ups of pancreatitis, but shoulder pain is new and unbearable at home" First BP in home: 493 systolic. Post ambulation to stretcher 552 systolic, 5 min after that, BP: 70/42." Pt denies recent infection, cough at baseline, pt on 3L [place] O2 at baseline. 20 in right forearm 500 NS bolus

## 2020-06-18 NOTE — Sepsis Progress Note (Signed)
Code sepsis protocol being monitored by eLink.  Notified provider of need to order blood cultures.

## 2020-06-18 NOTE — ED Notes (Signed)
Called 4E to check on IP handoff. Secretary states Lenard Forth, RN has been stuck in another patient's room and will review pt's chart momentarily

## 2020-06-18 NOTE — ED Notes (Signed)
Pt attempted to urinate in urinal. Pt states "I tried but I just can't go right now"

## 2020-06-19 ENCOUNTER — Inpatient Hospital Stay (HOSPITAL_COMMUNITY): Payer: Medicare Other

## 2020-06-19 ENCOUNTER — Encounter (HOSPITAL_COMMUNITY): Payer: Self-pay | Admitting: Family Medicine

## 2020-06-19 DIAGNOSIS — I4891 Unspecified atrial fibrillation: Secondary | ICD-10-CM | POA: Diagnosis not present

## 2020-06-19 DIAGNOSIS — K219 Gastro-esophageal reflux disease without esophagitis: Secondary | ICD-10-CM

## 2020-06-19 DIAGNOSIS — K8051 Calculus of bile duct without cholangitis or cholecystitis with obstruction: Secondary | ICD-10-CM | POA: Diagnosis not present

## 2020-06-19 DIAGNOSIS — A419 Sepsis, unspecified organism: Secondary | ICD-10-CM | POA: Diagnosis not present

## 2020-06-19 DIAGNOSIS — R6521 Severe sepsis with septic shock: Secondary | ICD-10-CM

## 2020-06-19 DIAGNOSIS — J439 Emphysema, unspecified: Secondary | ICD-10-CM | POA: Diagnosis not present

## 2020-06-19 DIAGNOSIS — R609 Edema, unspecified: Secondary | ICD-10-CM

## 2020-06-19 DIAGNOSIS — Z86711 Personal history of pulmonary embolism: Secondary | ICD-10-CM

## 2020-06-19 DIAGNOSIS — R778 Other specified abnormalities of plasma proteins: Secondary | ICD-10-CM | POA: Diagnosis not present

## 2020-06-19 DIAGNOSIS — Z9049 Acquired absence of other specified parts of digestive tract: Secondary | ICD-10-CM

## 2020-06-19 LAB — BLOOD CULTURE ID PANEL (REFLEXED) - BCID2

## 2020-06-19 LAB — COMPREHENSIVE METABOLIC PANEL
ALT: 54 U/L — ABNORMAL HIGH (ref 0–44)
AST: 110 U/L — ABNORMAL HIGH (ref 15–41)
Albumin: 2.1 g/dL — ABNORMAL LOW (ref 3.5–5.0)
Alkaline Phosphatase: 192 U/L — ABNORMAL HIGH (ref 38–126)
Anion gap: 12 (ref 5–15)
BUN: 31 mg/dL — ABNORMAL HIGH (ref 8–23)
CO2: 22 mmol/L (ref 22–32)
Calcium: 8.3 mg/dL — ABNORMAL LOW (ref 8.9–10.3)
Chloride: 100 mmol/L (ref 98–111)
Creatinine, Ser: 1.65 mg/dL — ABNORMAL HIGH (ref 0.61–1.24)
GFR, Estimated: 44 mL/min — ABNORMAL LOW (ref >60)
Glucose, Bld: 60 mg/dL — ABNORMAL LOW (ref 70–99)
Potassium: 3.5 mmol/L (ref 3.5–5.1)
Sodium: 134 mmol/L — ABNORMAL LOW (ref 135–145)
Total Bilirubin: 4.1 mg/dL — ABNORMAL HIGH (ref 0.3–1.2)
Total Protein: 5.3 g/dL — ABNORMAL LOW (ref 6.5–8.1)

## 2020-06-19 LAB — CBC WITH DIFFERENTIAL/PLATELET
Abs Immature Granulocytes: 0.34 10*3/uL — ABNORMAL HIGH (ref 0.00–0.07)
Basophils Absolute: 0 10*3/uL (ref 0.0–0.1)
Basophils Relative: 0 %
Eosinophils Absolute: 0 10*3/uL (ref 0.0–0.5)
Eosinophils Relative: 0 %
HCT: 29.5 % — ABNORMAL LOW (ref 39.0–52.0)
Hemoglobin: 9.5 g/dL — ABNORMAL LOW (ref 13.0–17.0)
Immature Granulocytes: 3 %
Lymphocytes Relative: 2 %
Lymphs Abs: 0.2 10*3/uL — ABNORMAL LOW (ref 0.7–4.0)
MCH: 32.9 pg (ref 26.0–34.0)
MCHC: 32.2 g/dL (ref 30.0–36.0)
MCV: 102.1 fL — ABNORMAL HIGH (ref 80.0–100.0)
Monocytes Absolute: 0.4 10*3/uL (ref 0.1–1.0)
Monocytes Relative: 4 %
Neutro Abs: 9.4 10*3/uL — ABNORMAL HIGH (ref 1.7–7.7)
Neutrophils Relative %: 91 %
Platelets: 118 10*3/uL — ABNORMAL LOW (ref 150–400)
RBC: 2.89 MIL/uL — ABNORMAL LOW (ref 4.22–5.81)
RDW: 15 % (ref 11.5–15.5)
WBC: 10.3 10*3/uL (ref 4.0–10.5)
nRBC: 0 % (ref 0.0–0.2)

## 2020-06-19 LAB — LACTIC ACID, PLASMA
Lactic Acid, Venous: 3 mmol/L (ref 0.5–1.9)
Lactic Acid, Venous: 2.1 mmol/L (ref 0.5–1.9)

## 2020-06-19 LAB — PHOSPHORUS: Phosphorus: 2.1 mg/dL — ABNORMAL LOW (ref 2.5–4.6)

## 2020-06-19 LAB — D-DIMER, QUANTITATIVE: D-Dimer, Quant: 4.28 ug/mL-FEU — ABNORMAL HIGH (ref 0.00–0.50)

## 2020-06-19 LAB — MRSA PCR SCREENING: MRSA by PCR: NEGATIVE

## 2020-06-19 LAB — ECHOCARDIOGRAM COMPLETE
AR max vel: 1.62 cm2
AV Area VTI: 1.5 cm2
AV Area mean vel: 1.54 cm2
AV Mean grad: 17 mmHg
AV Peak grad: 23.7 mmHg
Ao pk vel: 2.44 m/s
Height: 71 in
MV M vel: 4.82 m/s
MV Peak grad: 92.9 mmHg
S' Lateral: 3.7 cm
Weight: 3051.17 oz

## 2020-06-19 LAB — GLUCOSE, CAPILLARY
Glucose-Capillary: 105 mg/dL — ABNORMAL HIGH (ref 70–99)
Glucose-Capillary: 114 mg/dL — ABNORMAL HIGH (ref 70–99)
Glucose-Capillary: 115 mg/dL — ABNORMAL HIGH (ref 70–99)
Glucose-Capillary: 177 mg/dL — ABNORMAL HIGH (ref 70–99)
Glucose-Capillary: 73 mg/dL (ref 70–99)

## 2020-06-19 LAB — TROPONIN I (HIGH SENSITIVITY)
Troponin I (High Sensitivity): 1024 ng/L (ref <18)
Troponin I (High Sensitivity): 1169 ng/L (ref <18)

## 2020-06-19 LAB — MAGNESIUM: Magnesium: 1.3 mg/dL — ABNORMAL LOW (ref 1.7–2.4)

## 2020-06-19 LAB — BRAIN NATRIURETIC PEPTIDE: B Natriuretic Peptide: 1303.5 pg/mL — ABNORMAL HIGH (ref 0.0–100.0)

## 2020-06-19 LAB — HEPARIN LEVEL (UNFRACTIONATED): Heparin Unfractionated: <0.1 IU/mL — ABNORMAL LOW (ref 0.30–0.70)

## 2020-06-19 LAB — LIPASE, BLOOD: Lipase: 40 U/L (ref 11–51)

## 2020-06-19 LAB — T4, FREE: Free T4: 1.3 ng/dL — ABNORMAL HIGH (ref 0.61–1.12)

## 2020-06-19 LAB — TSH: TSH: 0.121 u[IU]/mL — ABNORMAL LOW (ref 0.350–4.500)

## 2020-06-19 MED ORDER — SODIUM CHLORIDE 0.9 % IV BOLUS: 500.0000 mL | Freq: Once | INTRAVENOUS | Status: DC | PRN

## 2020-06-19 MED ORDER — AMIODARONE HCL IN DEXTROSE 360-4.14 MG/200ML-% IV SOLN
60.0000 mg/h | INTRAVENOUS | Status: AC
  Administered 2020-06-19: 60 mg/h via INTRAVENOUS
  Filled 2020-06-19: qty 200

## 2020-06-19 MED ORDER — CHLORHEXIDINE GLUCONATE CLOTH 2 % EX PADS
6.0000 | MEDICATED_PAD | Freq: Every day | CUTANEOUS | Status: DC
  Administered 2020-06-20 – 2020-06-25 (×5): 6 via TOPICAL

## 2020-06-19 MED ORDER — ACETAMINOPHEN 650 MG RE SUPP: 650.0000 mg | RECTAL | Status: DC | PRN

## 2020-06-19 MED ORDER — AMIODARONE HCL IN DEXTROSE 360-4.14 MG/200ML-% IV SOLN
30.0000 mg/h | INTRAVENOUS | Status: DC
  Administered 2020-06-19: 30 mg/h via INTRAVENOUS
  Filled 2020-06-19 (×2): qty 200

## 2020-06-19 MED ORDER — ACETAMINOPHEN 325 MG PO TABS
650.0000 mg | ORAL_TABLET | ORAL | Status: DC | PRN
  Administered 2020-06-19 – 2020-06-21 (×4): 650 mg via ORAL
  Filled 2020-06-19 (×3): qty 2

## 2020-06-19 MED ORDER — AMIODARONE LOAD VIA INFUSION
150.0000 mg | Freq: Once | INTRAVENOUS | Status: AC
  Administered 2020-06-19: 150 mg via INTRAVENOUS
  Filled 2020-06-19: qty 83.34

## 2020-06-19 MED ORDER — SODIUM CHLORIDE 0.9 % IV SOLN
250.0000 mL | INTRAVENOUS | Status: DC
  Administered 2020-06-21: 250 mL via INTRAVENOUS

## 2020-06-19 MED ORDER — MAGNESIUM SULFATE 4 GM/100ML IV SOLN
4.0000 g | Freq: Once | INTRAVENOUS | Status: AC
  Administered 2020-06-19: 4 g via INTRAVENOUS
  Filled 2020-06-19: qty 100

## 2020-06-19 MED ORDER — LACTATED RINGERS IV BOLUS
1000.0000 mL | Freq: Once | INTRAVENOUS | Status: AC
  Administered 2020-06-19: 1000 mL via INTRAVENOUS

## 2020-06-19 MED ORDER — HEPARIN (PORCINE) 25000 UT/250ML-% IV SOLN
1300.0000 [IU]/h | INTRAVENOUS | Status: DC
  Administered 2020-06-19: 1300 [IU]/h via INTRAVENOUS
  Filled 2020-06-19: qty 250

## 2020-06-19 MED ORDER — ORAL CARE MOUTH RINSE
15.0000 mL | Freq: Two times a day (BID) | OROMUCOSAL | Status: DC
  Administered 2020-06-19 – 2020-06-24 (×10): 15 mL via OROMUCOSAL

## 2020-06-19 MED ORDER — NOREPINEPHRINE 4 MG/250ML-% IV SOLN
2.0000 ug/min | INTRAVENOUS | Status: DC
  Administered 2020-06-19: 5 ug/min via INTRAVENOUS
  Administered 2020-06-19: 8 ug/min via INTRAVENOUS
  Filled 2020-06-19 (×2): qty 250

## 2020-06-19 MED ORDER — DIGOXIN 0.25 MG/ML IJ SOLN: 0.2500 mg | INTRAMUSCULAR | Status: DC

## 2020-06-19 MED ORDER — POTASSIUM PHOSPHATES 15 MMOLE/5ML IV SOLN
30.0000 mmol | Freq: Once | INTRAVENOUS | Status: AC
  Administered 2020-06-19: 30 mmol via INTRAVENOUS
  Filled 2020-06-19 (×2): qty 10

## 2020-06-19 MED ORDER — SODIUM CHLORIDE 0.9 % IV BOLUS: 1000.0000 mL | Freq: Once | INTRAVENOUS | Status: DC | PRN

## 2020-06-19 MED ORDER — DEXTROSE 50 % IV SOLN
25.0000 mL | Freq: Once | INTRAVENOUS | Status: AC
  Administered 2020-06-19: 25 mL via INTRAVENOUS
  Filled 2020-06-19: qty 50

## 2020-06-19 MED ORDER — HEPARIN (PORCINE) 25000 UT/250ML-% IV SOLN: 1650.0000 [IU]/h | INTRAVENOUS | Status: DC

## 2020-06-19 MED ORDER — MAGNESIUM SULFATE 2 GM/50ML IV SOLN
2.0000 g | Freq: Once | INTRAVENOUS | Status: AC
  Administered 2020-06-19: 2 g via INTRAVENOUS
  Filled 2020-06-19: qty 50

## 2020-06-19 MED ORDER — NOREPINEPHRINE 4 MG/250ML-% IV SOLN: 0.0000 ug/min | INTRAVENOUS | Status: DC

## 2020-06-19 MED ORDER — NOREPINEPHRINE 4 MG/250ML-% IV SOLN
INTRAVENOUS | Status: AC
  Administered 2020-06-19: 2 ug/min via INTRAVENOUS
  Filled 2020-06-19: qty 250

## 2020-06-19 MED ORDER — ALBUMIN HUMAN 5 % IV SOLN
12.5000 g | Freq: Once | INTRAVENOUS | Status: AC
  Administered 2020-06-19: 12.5 g via INTRAVENOUS
  Filled 2020-06-19: qty 250

## 2020-06-19 MED ORDER — CHLORHEXIDINE GLUCONATE 0.12 % MT SOLN
15.0000 mL | Freq: Two times a day (BID) | OROMUCOSAL | Status: DC
  Administered 2020-06-19 – 2020-06-25 (×13): 15 mL via OROMUCOSAL
  Filled 2020-06-19 (×12): qty 15

## 2020-06-19 MED ORDER — SODIUM CHLORIDE 0.9 % IV SOLN: INTRAVENOUS | Status: DC

## 2020-06-19 NOTE — H&P (View-Only) (Signed)
Referring Provider:  EDP Primary Care Physician:  Imagene Riches, NP Primary Gastroenterologist:  Dr. Havery Moros  Reason for Consultation:  CBD stones and elevated LFTs  HPI: Warren [name] is a 70 y.o. male with medical history significant of COPD on 3 L oxygen at night, GERD, HTN, history of pancreatitis 13 years, history of pulmonary embolism, s/p cholecystectomy several years ago presenting with 2.5 weeks of abdominal pain.  He notes epigastric pain started about 2.5 weeks ago.  Worse with eating.  He presented to the ED on 4/7 with worsening pain and nausea with non-bloody emesis.  No diarrhea.  Notes fevers.  Upon presentation to the emergency department he was found to have elevated lactic acid up to 3.2, elevated LFTs with AST of 145, ALT 65, alk phos 199, total bili 4.4.  Overall LFTs are stable today.  Lipase is normal.  Has acute kidney injury.  White blood cell count was initially elevated at 13 K.  Has normalized this morning.  He has been given IV antibiotics and remains on IV Zosyn.  Blood cultures positive for gram negative rods.  CTA chest, abdomen, and pelvis showed the following:  Apparent 6 mm stone at the ampulla, with significant common bile duct and main pancreatic duct dilatation. No discrete pancreatic Mass.  CBD 15 mm.  He was transferred to the stepdown unit overnight for A. fib with RVR.  He is hypotensive and on pressors in the form of norepinephrine.  Troponin significantly elevated over 1K.  He is still tachy up to 120's at times.  Cardiology is consulting.  He denies any abdominal pain.  He said that he just has a really bad headache right now and keeps his eyes closed during the entirety of the visit.  Past Medical History:  Diagnosis Date  . Allergy   . COPD (chronic obstructive pulmonary disease) ([place])   . GERD (gastroesophageal reflux disease)   . History of pulmonary embolus (PE)   . Hypertension   . Insomnia     Past Surgical History:   Procedure Laterality Date  . CHOLECYSTECTOMY      Prior to Admission medications   Medication Sig Start Date End Date Taking? Authorizing Provider  albuterol (VENTOLIN HFA) 108 (90 Base) MCG/ACT inhaler Inhale 2 puffs into the lungs every 4 (four) hours as needed for wheezing or shortness of breath. 06/03/20  Yes [provider]  clonazePAM (KLONOPIN) 1 MG tablet Take 1 mg by mouth at bedtime as needed for anxiety. 06/08/20  Yes [provider]  cyanocobalamin 1000 MCG tablet Take 1,000 mcg by mouth daily.   Yes [provider]  DALIRESP 250 MCG TABS Take 250 mcg by mouth daily. 06/17/20  Yes [provider]  donepezil (ARICEPT) 10 MG tablet Take 10 mg by mouth daily. 05/20/20  Yes [provider]  famotidine (PEPCID) 20 MG tablet Take 20 mg by mouth daily.   Yes [provider]  fluticasone (FLONASE) 50 MCG/ACT nasal spray Place 1 spray into both nostrils daily as needed for allergies or rhinitis. 04/08/20  Yes [provider]  Fluticasone-Umeclidin-Vilant 100-62.5-25 MCG/INH AEPB Inhale 1 puff into the lungs daily.   Yes [provider]  gabapentin (NEURONTIN) 300 MG capsule Take 300 mg by mouth 4 (four) times daily.   Yes [provider]  HYDROcodone-acetaminophen (NORCO) 10-325 MG tablet Take 1 tablet by mouth every 6 (six) hours as needed for severe pain.   Yes [provider]  Iron-Folic Acid-Vit  B12 (IRON FORMULA PO) Take 81 mg by mouth 2 (two) times daily.   Yes [provider]  mometasone (NASONEX) 50 MCG/ACT nasal spray Place 2 sprays into the nose daily.   Yes [provider]  nystatin cream (MYCOSTATIN) Apply 1 application topically 2 (two) times daily. 06/08/20  Yes [provider]  omeprazole (PRILOSEC) 40 MG capsule Take 1 capsule (40 mg total) by mouth daily. 11/21/16  Yes Armbruster, Carlota Raspberry, MD  potassium chloride SA (KLOR-CON) 20 MEQ tablet Take 20 mEq by mouth daily.  05/21/20  Yes [provider]  tamsulosin (FLOMAX) 0.4 MG CAPS capsule Take 0.4 mg by mouth daily. 03/18/16  Yes [provider]  triamcinolone ointment (KENALOG) 0.1 % Apply 1 application topically daily as needed. 04/13/20  Yes [provider]  VITAMIN D, ERGOCALCIFEROL, PO Take 5,000 Units by mouth daily.   Yes [provider]    Current Facility-Administered Medications  Medication Dose Route Frequency Provider Last Rate Last Admin  . 0.9 %  sodium chloride infusion   Intravenous Continuous Elodia Florence., MD      . 0.9 %  sodium chloride infusion  250 mL Intravenous Continuous Elodia Florence., MD      . acetaminophen (TYLENOL) tablet 650 mg  650 mg Oral Q4H PRN Wofford, Cindie Laroche, RPH       Or  . acetaminophen (TYLENOL) suppository 650 mg  650 mg Rectal Q4H PRN Wofford, Drew A, RPH      . albuterol (PROVENTIL) (2.5 MG/3ML) 0.083% nebulizer solution 2.5 mg  2.5 mg Nebulization Q4H PRN Elodia Florence., MD      . amiodarone (NEXTERONE PREMIX) 360-4.14 MG/200ML-% (1.8 mg/mL) IV infusion  60 mg/hr Intravenous Continuous Merlene Laughter F, NP 33.3 mL/hr at 06/19/20 0857 60 mg/hr at 06/19/20 0857   Followed by  . amiodarone (NEXTERONE PREMIX) 360-4.14 MG/200ML-% (1.8 mg/mL) IV infusion  30 mg/hr Intravenous Continuous Merlene Laughter F, NP      . chlorhexidine (PERIDEX) 0.12 % solution 15 mL  15 mL Mouth Rinse BID Blount, Lolita Cram, NP      . Chlorhexidine Gluconate Cloth 2 % PADS 6 each  6 each Topical Daily Blount, Scarlette Shorts T, NP      . clonazePAM (KLONOPIN) tablet 1 mg  1 mg Oral QHS PRN Elodia Florence., MD   1 mg at 06/18/20 2306  . donepezil (ARICEPT) tablet 10 mg  10 mg Oral Daily Elodia Florence., MD   10 mg at 06/18/20 2053  . fluticasone (FLONASE) 50 MCG/ACT nasal spray 1 spray  1 spray Each Nare Daily PRN Elodia Florence., MD      . fluticasone furoate-vilanterol (BREO ELLIPTA) 100-25 MCG/INH 1 puff  1 puff Inhalation Daily  Elodia Florence., MD      . gabapentin (NEURONTIN) capsule 300 mg  300 mg Oral TID WC & HS Elodia Florence., MD   300 mg at 06/18/20 2219  . heparin ADULT infusion 100 units/mL (25000 units/21m)  1,300 Units/hr Intravenous Continuous BEudelia Bunch RPH      . HYDROmorphone (DILAUDID) injection 0.5 mg  0.5 mg Intravenous Q3H PRN PElodia Florence, MD       Or  . HYDROmorphone (DILAUDID) injection 1 mg  1 mg Intravenous Q3H PRN PElodia Florence, MD   1 mg at 06/19/20 0011  . magnesium sulfate IVPB 2 g 50 mL  2 g  Intravenous Once Elodia Florence., MD 50 mL/hr at 06/19/20 0846 2 g at 06/19/20 0846  . MEDLINE mouth rinse  15 mL Mouth Rinse q12n4p Blount, Scarlette Shorts T, NP      . norepinephrine (LEVOPHED) 47m in 2562mpremix infusion  2-10 mcg/min Intravenous Titrated PoElodia Florence MD 33.8 mL/hr at 06/19/20 0637 9 mcg/min at 06/19/20 0637  . ondansetron (ZOFRAN) tablet 4 mg  4 mg Oral Q6H PRN PoElodia Florence MD       Or  . ondansetron ([facility] 4 mg  4 mg Intravenous Q6H PRN PoElodia Florence MD   4 mg at 06/18/20 2225  . pantoprazole (PROTONIX) EC tablet 40 mg  40 mg Oral Daily PoElodia Florence MD   40 mg at 06/18/20 2052  . piperacillin-tazobactam (ZOSYN) IVPB 3.375 g  3.375 g Intravenous Q8H PoElodia Florence MD   Stopped at 06/19/20 [id number]. roflumilast (DALIRESP) tablet 250 mcg  250 mcg Oral Daily PoElodia Florence MD   250 mcg at 06/18/20 2219  . sodium chloride 0.9 % bolus 1,000 mL  1,000 mL Intravenous Once PRN BlLovey Newcomer, NP      . tamsulosin (FLOMAX) capsule 0.4 mg  0.4 mg Oral Daily PoElodia Florence MD   0.4 mg at 06/18/20 2052  . umeclidinium bromide (INCRUSE ELLIPTA) 62.5 MCG/INH 1 puff  1 puff Inhalation Daily PoElodia Florence MD      . vitamin B-12 (CYANOCOBALAMIN) tablet 1,000 mcg  1,000 mcg Oral Daily PoElodia Florence MD   1,000 mcg at 06/18/20 2053    Allergies as of 06/18/2020   . (No Known Allergies)    Family History  Problem Relation Age of Onset  . Colon cancer Neg Hx     Social History   Socioeconomic History  . Marital status: Divorced    Spouse name: Not on file  . Number of children: Not on file  . Years of education: Not on file  . Highest education level: Not on file  Occupational History  . Not on file  Tobacco Use  . Smoking status: Former SmResearch scientist (life sciences). Smokeless tobacco: Not on file  Substance and Sexual Activity  . Alcohol use: Not Currently  . Drug use: Not on file  . Sexual activity: Not on file  Other Topics Concern  . Not on file  Social History Narrative  . Not on file   Social Determinants of Health   Financial Resource Strain: Not on file  Food Insecurity: Not on file  Transportation Needs: Not on file  Physical Activity: Not on file  Stress: Not on file  Social Connections: Not on file  Intimate Partner Violence: Not on file    Review of Systems: ROS is O/W negative except as mentioned in HPI.  Physical Exam: Vital signs in last 24 hours: Temp:  [98.1 F (36.7 C)-101.6 F (38.7 C)] 99.1 F (37.3 C) (04/08 0800) Pulse Rate:  [83-127] 122 (04/08 0645) Resp:  [14-30] 21 (04/08 0645) BP: (59-128)/(35-83) 97/41 (04/08 0645) SpO2:  [89 %-99 %] 93 % (04/08 0645) Weight:  [86.2 kg-86.5 kg] 86.5 kg (04/07 2030) Last BM Date:  (Unknown) General:  Alert, Well-developed, well-nourished, pleasant and cooperative in NAD; appears a little uncomfortable Head:  Normocephalic and atraumatic. Eyes:  Slight scleral icterus noted. Ears:  Normal auditory acuity. Mouth:  No deformity or lesions. Lungs:  Clear throughout to auscultation.  No wheezes, crackles, or rhonchi.  Heart:  Irregularly irregular. Abdomen:  Soft, non-distended.  BS present.  Non-tender.  Msk:  Symmetrical without gross deformities. Pulses:  Normal pulses noted. Extremities:  Without clubbing or edema. Neurologic:  Alert and oriented x 4;  grossly normal  neurologically. Skin:  Intact without significant lesions or rashes. Psych:  Alert and cooperative. Normal mood and affect.  Intake/Output from previous day: 04/07 0701 - 04/08 0700 In: 1678.2 [I.V.:326.4; IV Piggyback:1351.8] Out: 350 [Urine:350]  Lab Results: Recent Labs    06/18/20 1353 06/18/20 1407 06/19/20 0431  WBC 13.1*  --  10.3  HGB 11.0* 11.6* 9.5*  HCT 33.7* 34.0* 29.5*  PLT 163  --  118*   BMET Recent Labs    06/18/20 1353 06/18/20 1407 06/19/20 0431  NA 131* 130* 134*  K 3.9 3.9 3.5  CL 94* 93* 100  CO2 23  --  22  GLUCOSE 61* 58* 60*  BUN 27* 25* 31*  CREATININE 2.04* 2.10* 1.65*  CALCIUM 8.1*  --  8.3*   LFT Recent Labs    06/19/20 0431  PROT 5.3*  ALBUMIN 2.1*  AST 110*  ALT 54*  ALKPHOS 192*  BILITOT 4.1*   Studies/Results: CT ABDOMEN PELVIS WO CONTRAST  Result Date: 06/19/2020 CLINICAL DATA:  Acute nonlocalized abdominal pain, fever, lethargy EXAM: CT ABDOMEN AND PELVIS WITHOUT CONTRAST TECHNIQUE: Multidetector CT imaging of the abdomen and pelvis was performed following the standard protocol without IV contrast. COMPARISON:  CT a chest abdomen and pelvis performed yesterday FINDINGS: Lower chest: Extensive lower lobe bronchial wall thickening. Slightly increased atelectasis compared to yesterday. Background paraseptal and centrilobular emphysema again noted. The visualized heart remains normal in size. Trace pericardial fluid versus thickening. Small hiatal hernia. Hepatobiliary: No focal liver abnormality is seen. Status post cholecystectomy. Stone again noted in the distal common bile duct. Persistent mild biliary ductal dilatation. Pancreas: Pancreatic atrophy. Punctate calcifications in the pancreatic head and uncinate process suggest prior pancreatitis. Spleen: Normal in size without focal abnormality. Adrenals/Urinary Tract: Normal adrenal glands. Persistent contrast within the kidneys and renal collecting system. No bladder mass visualized.  Stomach/Bowel: Colonic diverticular disease without CT evidence of active inflammation. Normal appendix. No evidence of bowel obstruction. Vascular/Lymphatic: Limited evaluation in the absence of intravenous contrast. Atherosclerotic calcifications throughout the aorta. Reproductive: Prostate is unremarkable. Other: No abdominal wall hernia or ascites. Musculoskeletal: No acute fracture or aggressive appearing lytic or blastic osseous lesion. IMPRESSION: 1. Minimal interval change compared to CT imaging performed yesterday. 2. Persistent distal common bile duct stone with associated mild biliary ductal dilatation. 3. Increasing bibasilar atelectasis. 4. Chronic bronchitic changes and emphysema suggests underlying COPD. 5. Additional ancillary findings as above without significant interval change. Electronically Signed   By: Jacqulynn Cadet M.D.   On: 06/19/2020 06:37   DG CHEST PORT 1 VIEW  Result Date: 06/19/2020 CLINICAL DATA:  Respiratory tract congestion. EXAM: PORTABLE CHEST 1 VIEW COMPARISON:  June 18, 2020. FINDINGS: Stable cardiomediastinal silhouette. No pneumothorax is noted. Stable bibasilar opacities are noted concerning for atelectasis possibly infiltrates. Bony thorax is unremarkable. IMPRESSION: Stable bibasilar opacities as described above. Electronically Signed   By: Marijo Conception M.D.   On: 06/19/2020 08:28   DG Chest Port 1 View  Result Date: 06/18/2020 CLINICAL DATA:  Cough and short of breath EXAM: PORTABLE CHEST 1 VIEW COMPARISON:  05/08/2020 FINDINGS: COPD with pulmonary scarring similar to the prior study. Mild left lower lobe airspace disease is new since the prior  study. Possible small left effusion. IMPRESSION: Mild left lower lobe atelectasis/infiltrate. Possible small left effusion COPD with scarring. Electronically Signed   By: Franchot Gallo M.D.   On: 06/18/2020 14:13   CT Angio Chest/Abd/Pel for Dissection W and/or W/WO  Result Date: 06/18/2020 CLINICAL DATA:  Epigastric  abdominal pain radiating to the upper back and shoulder. Hypotension. EXAM: CT ANGIOGRAPHY CHEST, ABDOMEN AND PELVIS TECHNIQUE: Non-contrast CT of the chest was initially obtained. Multidetector CT imaging through the chest, abdomen and pelvis was performed using the standard protocol during bolus administration of intravenous contrast. Multiplanar reconstructed images and MIPs were obtained and reviewed to evaluate the vascular anatomy. CONTRAST:  49m OMNIPAQUE IOHEXOL 350 MG/ML SOLN COMPARISON:  05/27/2016 chest CT angiogram. 08/01/2016 PET-CT. [id number] abdomen/pelvis. FINDINGS: CTA CHEST FINDINGS Motion degraded scan limits assessment. Cardiovascular: Normal heart size. No significant pericardial effusion/thickening. Three-vessel coronary atherosclerosis. Patent aortic arch branch vessels. Atherosclerotic nonaneurysmal thoracic aorta. No acute intramural hematoma, dissection, pseudoaneurysm or penetrating atherosclerotic ulcer in the thoracic aorta. Top-normal caliber main pulmonary artery (3.4 cm diameter). No central pulmonary emboli. Mediastinum/Nodes: No discrete thyroid nodules. Unremarkable esophagus. No pathologically enlarged axillary, mediastinal or hilar lymph nodes. Lungs/Pleura: No pneumothorax. No pleural effusion. Severe centrilobular and paraseptal emphysema with diffuse bronchial wall thickening. No acute consolidative airspace disease, lung masses or significant pulmonary nodules. Musculoskeletal: No aggressive appearing focal osseous lesions. Mild thoracic spondylosis. Review of the MIP images confirms the above findings. CTA ABDOMEN AND PELVIS FINDINGS VASCULAR Aorta: Atherosclerotic nonaneurysmal abdominal aorta with no dissection or significant stenosis. Celiac: Approximately 60% proximal atherosclerotic stenosis. No aneurysm or dissection. SMA: Patent without evidence of aneurysm, dissection, vasculitis or significant stenosis. Renals: Approximately 50% atherosclerotic proximal stenoses in  the bilateral renal arteries. IMA: Patent without evidence of aneurysm, dissection, vasculitis or significant stenosis. Inflow: Focal high-grade atherosclerotic stenosis in the right common femoral artery (series 6/image 187). Otherwise patent without dissection or aneurysm. Veins: No obvious venous abnormality within the limitations of this arterial phase study. Review of the MIP images confirms the above findings. NON-VASCULAR Hepatobiliary: Normal liver with no liver mass. Cholecystectomy. There is apparent 6 mm stone at the ampulla (series 6/image 130). Common bile duct diameter 15 mm. Mild diffuse central intrahepatic biliary ductal dilatation. Pancreas: Newly dilated main pancreatic duct (5 mm diameter). No discrete pancreatic mass. Spleen: Normal size. No mass. Adrenals/Urinary Tract: Normal adrenals. No hydronephrosis. No contour deforming renal masses. Normal bladder. Stomach/Bowel: Small hiatal hernia. Otherwise normal nondistended stomach. Normal caliber small bowel with no small bowel wall thickening. Scattered tiny calcified stones in the otherwise normal appendix. Marked sigmoid diverticulosis with no large bowel wall thickening or significant pericolonic fat stranding. Vascular/Lymphatic: No pathologically enlarged lymph nodes in the abdomen or pelvis. Reproductive: Normal size prostate. Other: No pneumoperitoneum, ascites or focal fluid collection. Musculoskeletal: No aggressive appearing focal osseous lesions. Marked lumbar spondylosis. Review of the MIP images confirms the above findings. IMPRESSION: 1. No acute aortic syndrome. 2. Apparent 6 mm stone at the ampulla, with significant common bile duct and main pancreatic duct dilatation. No discrete pancreatic mass. Recommend correlation with serum bilirubin levels. GI consultation for consideration of ERCP suggested. 3. Focal high-grade atherosclerotic stenosis in the right common femoral artery. 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  Approximately 50% proximal bilateral renal artery stenoses. 4. Three-vessel coronary atherosclerosis. 5. Small hiatal hernia. 6. Marked sigmoid diverticulosis. 7. Aortic Atherosclerosis (ICD10-I70.0) and Emphysema (ICD10-J43.9). Electronically Signed   By: JIlona SorrelM.D.   On: 06/18/2020 16:34   IMPRESSION:  -  70 year old male with 2.5 weeks of intermittent epigastric abdominal pain that worsened in intensity and was associated with nausea and vomiting.  Upon presentation had elevated LFTs and CT scan showing CBD dilation 6 mm stone at the ampulla.  Concern for cholangitis.  Has gram-negative rod bacteremia.  On appropriate IV antibiotics. -Atrial fibrillation with RVR: Transferred to stepdown unit overnight.  Is on amiodarone.  Troponin also significantly elevated.  Cardiology is consulting. -Acute kidney injury: Improving with IV hydration. -COPD on home O2 at night  PLAN: -Patient needs ERCP.  Due to his cardiac issues requiring transfer to stepdown unit overnight we are waiting for cardiology evaluation and hopeful that they will get his heart rate controlled.  Tentatively we are planning for 730 tomorrow morning.  They are placing him on IV heparin so I already spoke with the pharmacist who will tentatively plan to have that held at 1:30 AM. -Otherwise continue IV antibiotics and supportive care. -Trend labs/LFTs.   Laban Emperor. Zehr  06/19/2020, 9:17 AM  ________________________________________________________________________  Velora Heckler GI MD note:  I personally examined the patient, reviewed the data and agree with the assessment and plan described above.  70 yo man with remote lap chole, now with elevated LFTs, WBC, dilated biliary and pancreatic ducts with apparent calcified stone in distal CBD or ampulla.  This acute illness has led to afib with RVR, cardiac strain. Currently heparinized, on amio which is controlling his rate.  I am planning ERCP tomorrow AM, heparin will be held 6 hours  prior, continue broad spect IV abx for now.  I will allow clears until midnight.   Owens Loffler, MD [facility] Pager [phone]

## 2020-06-19 NOTE — Consult Note (Addendum)
Cardiology Consultation:   Patient ID: Warren [name] MRN: [id number]; DOB: 04-24-1950  Admit date: 06/18/2020 Date of Consult: 06/19/2020  PCP:  Imagene Riches, NP   [place]  Cardiologist:  No primary care provider on file. New Dr Abran Richard, [place] 2018 Advanced Practice Provider:  No care team member to display Electrophysiologist:  None  [id number]}    Patient Profile:   Warren [name] is a 70 y.o. male with a hx of PE 2018, OSA on CPAP, COPD on nocturnal O2, RV enlargement on echo (prior to PE dx), pancreatitis, who is being seen today for the evaluation of Afib, RVR, at the request of Dr Florene Glen.  History of Present Illness:   Mr. [name] saw Dr Otho Perl in 2018, had echo (no results available), nl MV.  Pt admitted 04/07 w/ abdominal pain >> 6 mm stone at ampulla with significant common bile duct and main pancreatic duct dilatation on CT. Started on ABX, IVF, concern for sepsis 2nd cholangitis. GI to see. Pt developed rapid atrial fib, cards asked to see.  Mr. [name] has not had any recent cardiology symptoms.  He has had no chest pain.  He has never had palpitations.  He has chronic dyspnea on exertion related to his COPD.  He has O2 that he uses at night, but not during the day.  About 3 weeks ago, before his abdominal symptoms began, he had several episodes of being dizzy or lightheaded.  He did not completely lose consciousness or fall.  The episodes resolved without intervention or evaluation.  He did not have palpitations during those episodes.  Currently, in the hospital, he is having significant abdominal pain.  He does not have any palpitations and is completely unaware of the atrial fib.  He has no idea when it might have started.  He does not check his blood pressure or heart rate at home.  On amiodarone, currently at 60 mg/h, his heart rate is in the 100s-1 teens most of the time.  This is appropriate for an acute illness.  He is  also hypotensive, and currently on Levophed for blood pressure support.  He is receiving IV antibiotics as well.   Past Medical History:  Diagnosis Date  . Allergy   . COPD (chronic obstructive pulmonary disease) ([place])   . GERD (gastroesophageal reflux disease)   . History of pulmonary embolus (PE)   . Hypertension   . Insomnia   . OSA (obstructive sleep apnea)     Past Surgical History:  Procedure Laterality Date  . CHOLECYSTECTOMY       Home Medications:  Prior to Admission medications   Medication Sig Start Date End Date Taking? Authorizing Provider  albuterol (VENTOLIN HFA) 108 (90 Base) MCG/ACT inhaler Inhale 2 puffs into the lungs every 4 (four) hours as needed for wheezing or shortness of breath. 06/03/20  Yes [provider]  clonazePAM (KLONOPIN) 1 MG tablet Take 1 mg by mouth at bedtime as needed for anxiety. 06/08/20  Yes [provider]  cyanocobalamin 1000 MCG tablet Take 1,000 mcg by mouth daily.   Yes [provider]  DALIRESP 250 MCG TABS Take 250 mcg by mouth daily. 06/17/20  Yes [provider]  donepezil (ARICEPT) 10 MG tablet Take 10 mg by mouth daily. 05/20/20  Yes [provider]  famotidine (PEPCID) 20 MG tablet Take 20 mg by mouth daily.   Yes [provider]  fluticasone (FLONASE) 50 MCG/ACT nasal spray  Place 1 spray into both nostrils daily as needed for allergies or rhinitis. 04/08/20  Yes [provider]  Fluticasone-Umeclidin-Vilant 100-62.5-25 MCG/INH AEPB Inhale 1 puff into the lungs daily.   Yes [provider]  gabapentin (NEURONTIN) 300 MG capsule Take 300 mg by mouth 4 (four) times daily.   Yes [provider]  HYDROcodone-acetaminophen (NORCO) 10-325 MG tablet Take 1 tablet by mouth every 6 (six) hours as needed for severe pain.   Yes [provider]  Iron-Folic Acid-Vit A54 (IRON FORMULA PO) Take 81 mg by mouth 2 (two) times daily.   Yes [provider]   mometasone (NASONEX) 50 MCG/ACT nasal spray Place 2 sprays into the nose daily.   Yes [provider]  nystatin cream (MYCOSTATIN) Apply 1 application topically 2 (two) times daily. 06/08/20  Yes [provider]  omeprazole (PRILOSEC) 40 MG capsule Take 1 capsule (40 mg total) by mouth daily. 11/21/16  Yes Armbruster, Carlota Raspberry, MD  potassium chloride SA (KLOR-CON) 20 MEQ tablet Take 20 mEq by mouth daily. 05/21/20  Yes [provider]  tamsulosin (FLOMAX) 0.4 MG CAPS capsule Take 0.4 mg by mouth daily. 03/18/16  Yes [provider]  triamcinolone ointment (KENALOG) 0.1 % Apply 1 application topically daily as needed. 04/13/20  Yes [provider]  VITAMIN D, ERGOCALCIFEROL, PO Take 5,000 Units by mouth daily.   Yes [provider]    Inpatient Medications: Scheduled Meds: . chlorhexidine  15 mL Mouth Rinse BID  . Chlorhexidine Gluconate Cloth  6 each Topical Daily  . donepezil  10 mg Oral Daily  . fluticasone furoate-vilanterol  1 puff Inhalation Daily  . gabapentin  300 mg Oral TID WC & HS  . mouth rinse  15 mL Mouth Rinse q12n4p  . pantoprazole  40 mg Oral Daily  . roflumilast  250 mcg Oral Daily  . tamsulosin  0.4 mg Oral Daily  . umeclidinium bromide  1 puff Inhalation Daily  . cyanocobalamin  1,000 mcg Oral Daily   Continuous Infusions: . sodium chloride 10 mL/hr at 06/19/20 0934  . sodium chloride    . amiodarone 60 mg/hr (06/19/20 0857)   Followed by  . amiodarone    . heparin    . magnesium sulfate bolus IVPB    . norepinephrine (LEVOPHED) Adult infusion 9 mcg/min (06/19/20 0981)  . piperacillin-tazobactam (ZOSYN)  IV 3.375 g (06/19/20 0921)  . potassium PHOSPHATE IVPB (in mmol)    . sodium chloride     PRN Meds: acetaminophen **OR** acetaminophen, albuterol, clonazePAM, fluticasone, HYDROmorphone (DILAUDID) injection **OR** HYDROmorphone (DILAUDID) injection, ondansetron **OR** ondansetron (ZOFRAN) IV, sodium  chloride  Allergies:   No Known Allergies  Social History:   Social History   Socioeconomic History  . Marital status: Divorced    Spouse name: Not on file  . Number of children: Not on file  . Years of education: Not on file  . Highest education level: Not on file  Occupational History  . Not on file  Tobacco Use  . Smoking status: Former Smoker    Quit date: 03/15/2015    Years since quitting: 5.2  . Smokeless tobacco: Not on file  Substance and Sexual Activity  . Alcohol use: Not Currently  . Drug use: Not on file  . Sexual activity: Not on file  Other Topics Concern  . Not on file  Social History Narrative  . Not on file   Social Determinants of Health   Financial Resource Strain: Not  on file  Food Insecurity: Not on file  Transportation Needs: Not on file  Physical Activity: Not on file  Stress: Not on file  Social Connections: Not on file  Intimate Partner Violence: Not on file    Family History:   Family History  Problem Relation Age of Onset  . Colon cancer Neg Hx     Family Status  Relation Name Status  . Mother  Deceased  . Father  Deceased  . Neg Hx  (Not Specified)   ROS:  Please see the history of present illness.  All other ROS reviewed and negative.     Physical Exam/Data:   Vitals:   06/19/20 0600 06/19/20 0630 06/19/20 0645 06/19/20 0800  BP: (!) 99/52 (!) 105/49 (!) 97/41   Pulse: (!) 122 (!) 127 (!) 122   Resp: (!) 25 (!) 22 (!) 21   Temp:    99.1 F (37.3 C)  TempSrc:    Oral  SpO2: 91% 92% 93%   Weight:      Height:        Intake/Output Summary (Last 24 hours) at 06/19/2020 0951 Last data filed at 06/19/2020 4315 Gross per 24 hour  Intake 1678.24 ml  Output 350 ml  Net 1328.24 ml   Last 3 Weights 06/18/2020 06/18/2020 06/18/2020  Weight (lbs) 190 lb 11.2 oz 190 lb 190 lb  Weight (kg) 86.5 kg 86.183 kg 86.183 kg     Body mass index is 26.6 kg/m.  General:  Well nourished, well developed, in acute distress HEENT: normal Lymph:  no adenopathy Neck: no JVD Endocrine:  No thryomegaly Vascular: No carotid bruits; 4/4 extremity pulses 2+    Cardiac:  normal S1, S2; Irreg R&R; no murmur Lungs:  clear to auscultation bilaterally, no wheezing, rhonchi or rales  Abd: soft, nontender, no hepatomegaly  Ext: no edema Musculoskeletal:  No deformities, BUE and BLE strength normal and equal Skin: warm and dry  Neuro:  CNs 2-12 intact, no focal abnormalities noted Psych:  Normal affect   EKG:  The EKG was personally reviewed and demonstrates:  04/08, atrial fib, RVR, HR 132 Telemetry:  Telemetry was personally reviewed and demonstrates:  Atrial fib, RVR, PVCs  Relevant CV Studies:  ECHO: Ordered  MV 2018 His recent nuclear scan showed no evidence of ischemia is very reassuring that he does not have significant underlying coronary disease.   Laboratory Data:  High Sensitivity Troponin:   Recent Labs  Lab 06/19/20 0727  TROPONINIHS 1,024*     Chemistry Recent Labs  Lab 06/18/20 1353 06/18/20 1407 06/19/20 0431  NA 131* 130* 134*  K 3.9 3.9 3.5  CL 94* 93* 100  CO2 23  --  22  GLUCOSE 61* 58* 60*  BUN 27* 25* 31*  CREATININE 2.04* 2.10* 1.65*  CALCIUM 8.1*  --  8.3*  GFRNONAA 34*  --  44*  ANIONGAP 14  --  12    Recent Labs  Lab 06/18/20 1353 06/19/20 0431  PROT 5.9* 5.3*  ALBUMIN 2.5* 2.1*  AST 145* 110*  ALT 65* 54*  ALKPHOS 199* 192*  BILITOT 4.1* 4.1*   Hematology Recent Labs  Lab 06/18/20 1353 06/18/20 1407 06/19/20 0431  WBC 13.1*  --  10.3  RBC 3.31*  --  2.89*  HGB 11.0* 11.6* 9.5*  HCT 33.7* 34.0* 29.5*  MCV 101.8*  --  102.1*  MCH 33.2  --  32.9  MCHC 32.6  --  32.2  RDW 14.8  --  15.0  PLT 163  --  118*   BNP Recent Labs  Lab 06/19/20 0727  BNP 1,303.5*    DDimer  Recent Labs  Lab 06/19/20 0727  DDIMER 4.28*   Lab Results  Component Value Date   TSH 0.121 (L) 06/19/2020   No results found for: HGBA1C  Lab Results  Component Value Date   CHOL 213 (H)  03/22/2007   HDL 54 03/22/2007   LDLCALC 143 (H) 03/22/2007   TRIG 78 03/22/2007   CHOLHDL 3.9 Ratio 03/22/2007    Radiology/Studies:  CT ABDOMEN PELVIS WO CONTRAST  Result Date: 06/19/2020 CLINICAL DATA:  Acute nonlocalized abdominal pain, fever, lethargy EXAM: CT ABDOMEN AND PELVIS WITHOUT CONTRAST TECHNIQUE: Multidetector CT imaging of the abdomen and pelvis was performed following the standard protocol without IV contrast. COMPARISON:  CT a chest abdomen and pelvis performed yesterday FINDINGS: Lower chest: Extensive lower lobe bronchial wall thickening. Slightly increased atelectasis compared to yesterday. Background paraseptal and centrilobular emphysema again noted. The visualized heart remains normal in size. Trace pericardial fluid versus thickening. Small hiatal hernia. Hepatobiliary: No focal liver abnormality is seen. Status post cholecystectomy. Stone again noted in the distal common bile duct. Persistent mild biliary ductal dilatation. Pancreas: Pancreatic atrophy. Punctate calcifications in the pancreatic head and uncinate process suggest prior pancreatitis. Spleen: Normal in size without focal abnormality. Adrenals/Urinary Tract: Normal adrenal glands. Persistent contrast within the kidneys and renal collecting system. No bladder mass visualized. Stomach/Bowel: Colonic diverticular disease without CT evidence of active inflammation. Normal appendix. No evidence of bowel obstruction. Vascular/Lymphatic: Limited evaluation in the absence of intravenous contrast. Atherosclerotic calcifications throughout the aorta. Reproductive: Prostate is unremarkable. Other: No abdominal wall hernia or ascites. Musculoskeletal: No acute fracture or aggressive appearing lytic or blastic osseous lesion. IMPRESSION: 1. Minimal interval change compared to CT imaging performed yesterday. 2. Persistent distal common bile duct stone with associated mild biliary ductal dilatation. 3. Increasing bibasilar  atelectasis. 4. Chronic bronchitic changes and emphysema suggests underlying COPD. 5. Additional ancillary findings as above without significant interval change. Electronically Signed   By: Jacqulynn Cadet M.D.   On: 06/19/2020 06:37   DG CHEST PORT 1 VIEW  Result Date: 06/19/2020 CLINICAL DATA:  Respiratory tract congestion. EXAM: PORTABLE CHEST 1 VIEW COMPARISON:  June 18, 2020. FINDINGS: Stable cardiomediastinal silhouette. No pneumothorax is noted. Stable bibasilar opacities are noted concerning for atelectasis possibly infiltrates. Bony thorax is unremarkable. IMPRESSION: Stable bibasilar opacities as described above. Electronically Signed   By: Marijo Conception M.D.   On: 06/19/2020 08:28   DG Chest Port 1 View  Result Date: 06/18/2020 CLINICAL DATA:  Cough and short of breath EXAM: PORTABLE CHEST 1 VIEW COMPARISON:  05/08/2020 FINDINGS: COPD with pulmonary scarring similar to the prior study. Mild left lower lobe airspace disease is new since the prior study. Possible small left effusion. IMPRESSION: Mild left lower lobe atelectasis/infiltrate. Possible small left effusion COPD with scarring. Electronically Signed   By: Franchot Gallo M.D.   On: 06/18/2020 14:13   CT Angio Chest/Abd/Pel for Dissection W and/or W/WO  Result Date: 06/18/2020 CLINICAL DATA:  Epigastric abdominal pain radiating to the upper back and shoulder. Hypotension. EXAM: CT ANGIOGRAPHY CHEST, ABDOMEN AND PELVIS TECHNIQUE: Non-contrast CT of the chest was initially obtained. Multidetector CT imaging through the chest, abdomen and pelvis was performed using the standard protocol during bolus administration of intravenous contrast. Multiplanar reconstructed images and MIPs were obtained and reviewed to evaluate the vascular anatomy. CONTRAST:  41mL OMNIPAQUE  IOHEXOL 350 MG/ML SOLN COMPARISON:  05/27/2016 chest CT angiogram. 08/01/2016 PET-CT. 71696 CT abdomen/pelvis. FINDINGS: CTA CHEST FINDINGS Motion degraded scan limits  assessment. Cardiovascular: Normal heart size. No significant pericardial effusion/thickening. Three-vessel coronary atherosclerosis. Patent aortic arch branch vessels. Atherosclerotic nonaneurysmal thoracic aorta. No acute intramural hematoma, dissection, pseudoaneurysm or penetrating atherosclerotic ulcer in the thoracic aorta. Top-normal caliber main pulmonary artery (3.4 cm diameter). No central pulmonary emboli. Mediastinum/Nodes: No discrete thyroid nodules. Unremarkable esophagus. No pathologically enlarged axillary, mediastinal or hilar lymph nodes. Lungs/Pleura: No pneumothorax. No pleural effusion. Severe centrilobular and paraseptal emphysema with diffuse bronchial wall thickening. No acute consolidative airspace disease, lung masses or significant pulmonary nodules. Musculoskeletal: No aggressive appearing focal osseous lesions. Mild thoracic spondylosis. Review of the MIP images confirms the above findings. CTA ABDOMEN AND PELVIS FINDINGS VASCULAR Aorta: Atherosclerotic nonaneurysmal abdominal aorta with no dissection or significant stenosis. Celiac: Approximately 60% proximal atherosclerotic stenosis. No aneurysm or dissection. SMA: Patent without evidence of aneurysm, dissection, vasculitis or significant stenosis. Renals: Approximately 50% atherosclerotic proximal stenoses in the bilateral renal arteries. IMA: Patent without evidence of aneurysm, dissection, vasculitis or significant stenosis. Inflow: Focal high-grade atherosclerotic stenosis in the right common femoral artery (series 6/image 187). Otherwise patent without dissection or aneurysm. Veins: No obvious venous abnormality within the limitations of this arterial phase study. Review of the MIP images confirms the above findings. NON-VASCULAR Hepatobiliary: Normal liver with no liver mass. Cholecystectomy. There is apparent 6 mm stone at the ampulla (series 6/image 130). Common bile duct diameter 15 mm. Mild diffuse central intrahepatic  biliary ductal dilatation. Pancreas: Newly dilated main pancreatic duct (5 mm diameter). No discrete pancreatic mass. Spleen: Normal size. No mass. Adrenals/Urinary Tract: Normal adrenals. No hydronephrosis. No contour deforming renal masses. Normal bladder. Stomach/Bowel: Small hiatal hernia. Otherwise normal nondistended stomach. Normal caliber small bowel with no small bowel wall thickening. Scattered tiny calcified stones in the otherwise normal appendix. Marked sigmoid diverticulosis with no large bowel wall thickening or significant pericolonic fat stranding. Vascular/Lymphatic: No pathologically enlarged lymph nodes in the abdomen or pelvis. Reproductive: Normal size prostate. Other: No pneumoperitoneum, ascites or focal fluid collection. Musculoskeletal: No aggressive appearing focal osseous lesions. Marked lumbar spondylosis. Review of the MIP images confirms the above findings. IMPRESSION: 1. No acute aortic syndrome. 2. Apparent 6 mm stone at the ampulla, with significant common bile duct and main pancreatic duct dilatation. No discrete pancreatic mass. Recommend correlation with serum bilirubin levels. GI consultation for consideration of ERCP suggested. 3. Focal high-grade atherosclerotic stenosis in the right common femoral artery. 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 Approximately 50% proximal bilateral renal artery stenoses. 4. Three-vessel coronary atherosclerosis. 5. Small hiatal hernia. 6. Marked sigmoid diverticulosis. 7. Aortic Atherosclerosis (ICD10-I70.0) and Emphysema (ICD10-J43.9). Electronically Signed   By: Ilona Sorrel M.D.   On: 06/18/2020 16:34    Assessment and Plan:   1. Atrial fib, RVR - unknown duration - elevated HR expected in setting of acute illness - amio started, seems to be helping HR, generally < 120 now - agree w/ heparin for anticoagulation - for now, rate control strategy, but cannot use BB/CCB while on pressors  2. Acute sepsis ?from  choledocholithiasis, possible cholangitis - ABX and management per CCM  3. Elevated troponin - no clear hx ischemic sx, pain largely in abd, LUQ - Echo pending - 3 v coronary atherosclerosis on CT - f/u on echo results, once recovered from acute illness, may need ischemic eval  4. PAD - see CT report, pt had R  CFA, bilat renal and celiac dz - decide on management after pt recovers  5. abnl TSH - no recent checks available - will ck free T3, T4   Risk Assessment/Risk Scores:   CHA2DS2-VASc Score = 3  This indicates a 3.2% annual risk of stroke. The patient's score is based upon: CHF History: No HTN History: Yes Diabetes History: No Stroke History: No Vascular Disease History: Yes, based on atherosclerosis seen on CT Age Score: 1 Gender Score: 0         For questions or updates, please contact [place] Please consult [email or web address] for contact info under    Signed, Rosaria Ferries, PA-C  06/19/2020 9:51 AM   Attending Note:   The patient was seen and examined.  Agree with assessment and plan as noted above.  Changes made to the above note as needed.  Patient seen and independently examined with Rosaria Ferries, PA .   We discussed all aspects of the encounter. I agree with the assessment and plan as stated above.  1.  CBD stone with cholangitis:   He became septic last night.  Developed rapid Afib, hypotension + troponins which may be due to sepsis and demand ischemia.  Given the urgency of the ERCP , we will not be able to do any  Further testing prior to the ERCP.  He is fairly inactive,  Is able to climb stairs slowly,  Does yard work - slowly . Echo has been ordered.  ECG shows afib with HR of 132 with no ST or T wave changes.   He is at moderate - high risk ( very difficult to assess further) for CV complications with ERCP . At this point the benefits of ERCP and relieving the bile duct obstruction outway the risks from a C V standpoint   We will need  to assess him further after he improves from this cholangitis / sepsis syndrome.   2.  Atrial fib:  Is on amio to try to control HR .  Cont for now  3.  Hx of pulmonary embolus:    4/  PAD :  Following    I have spent a total of 40 minutes with patient reviewing hospital  notes , telemetry, EKGs, labs and examining patient as well as establishing an assessment and plan that was discussed with the patient. > 50% of time was spent in direct patient care.    Thayer Headings, Brooke Bonito., MD, [facility] 06/19/2020, 10:24 AM [address],  [place] Pager [phone]

## 2020-06-19 NOTE — CV Procedure (Signed)
BLE venous duplex completed.  Results can be found under chart review under CV PROC. 06/19/2020 1:34 PM Rontae Inglett RVT, RDMS

## 2020-06-19 NOTE — Progress Notes (Signed)
Patient received from floor. Report received from [employer]. See flowsheet for vital signs and assessment. Jeannette Corpus NP at bedside to assess patient. Critical Lactic Acid of 3.0 reported to X. Blount, NP. See orders for new orders received. See MAR for medications administered. Stat CT completed. Dr. Carson Myrtle at bedside to assess patient. Will continue to monitor patient.

## 2020-06-19 NOTE — Progress Notes (Signed)
Initial Nutrition Assessment  DOCUMENTATION CODES:   Not applicable  INTERVENTION:  - diet advancement as medically feasible.   NUTRITION DIAGNOSIS:   Inadequate oral intake related to inability to eat as evidenced by NPO status.  GOAL:   Patient will meet greater than or equal to 90% of their needs  MONITOR:   Diet advancement,Labs,Weight trends  REASON FOR ASSESSMENT:   Malnutrition Screening Tool  ASSESSMENT:   70 y.o. male with medical history of COPD on 3L O2 at night, GERD, HTN, pancreatitis, hx of pulmonary embolism, s/p cholecystectomy several years ago. He presented to the ED with 2.5 weeks of abdominal pain which has been sharp, constant, and radiating in the epigastric area. Pain is worse with eating. He as experiencing nausea and had 3-4 vomiting episodes since pain began.  Patient was discussed in rounds this AM. He went to CT this AM and returned shortly before RD visit. Patient was laying bed with no family or visitors present.  He was very drowsy during visit. He denied abdominal pain, pressure, or nausea at the time of visit. He was unable to recall the last time that he ate.  Diet was advanced from NPO to CLD today at 1200.   Weight yesterday was 191 lb, weight on [facility]/02/[facility] at [facility] was 195 lb, and weight on 06/02/16 within [facility] was 197 lb.   Per notes: - pending ERCP--possibly 4/9 AM - sepsis 2/2 cholangitis, cholodocholithiasis - hypotension   Labs reviewed; CBGs: 73 and 115 mg/dl, Na: 134 mmol/l, BUN: 31 mg/dl, creatinine: 1.65 mg/dl, Ca: 8.3 mg/dl, Phos: 2.1 mg/dl, Mg: 1.3 mg/dl, Alk Phos and LFTs elevated, GFR: 44 ml/min.  Medications reviewed; 4 g IV Mg sulfate x1 run 4/8, 40 mg oral protonix/day, 30 mmol IV KPhos x1 run 4/8, 1000 mcg oral cyanocobalamin/day.      NUTRITION - FOCUSED PHYSICAL EXAM:  Flowsheet Row Most Recent Value  Orbital Region No depletion  Upper Arm Region No depletion  Thoracic and Lumbar Region Unable to assess   Buccal Region No depletion  Temple Region Mild depletion  Clavicle Bone Region Mild depletion  Clavicle and Acromion Bone Region Mild depletion  Scapular Bone Region Unable to assess  Dorsal Hand No depletion  Patellar Region No depletion  Anterior Thigh Region No depletion  Posterior Calf Region No depletion  Edema (RD Assessment) Mild  [BLE]  Hair Reviewed  Eyes Reviewed  Mouth Reviewed  Skin Reviewed  Nails Reviewed       Diet Order:   Diet Order            Diet NPO time specified  Diet effective midnight           Diet NPO time specified  Diet effective now                 EDUCATION NEEDS:   Not appropriate for education at this time  Skin:  Skin Assessment: Reviewed RN Assessment  Last BM:  PTA/unknown  Height:   Ht Readings from Last 1 Encounters:  06/18/20 5' 11"  (1.803 m)    Weight:   Wt Readings from Last 1 Encounters:  06/18/20 86.5 kg     Estimated Nutritional Needs:  Kcal:  1925-2150 kcal Protein:  95-105 grams Fluid:  >/= 2.3 L/day     Jarome Matin, MS, RD, LDN, CNSC Inpatient Clinical Dietitian RD pager # available in AMION  After hours/weekend pager # available in [facility]

## 2020-06-19 NOTE — Consult Note (Addendum)
Referring Provider:  EDP Primary Care Physician:  Imagene Riches, NP Primary Gastroenterologist:  Dr. Havery Moros  Reason for Consultation:  CBD stones and elevated LFTs  HPI: Warren [name] is a 70 y.o. male with medical history significant of COPD on 3 L oxygen at night, GERD, HTN, history of pancreatitis 13 years, history of pulmonary embolism, s/p cholecystectomy several years ago presenting with 2.5 weeks of abdominal pain.  He notes epigastric pain started about 2.5 weeks ago.  Worse with eating.  He presented to the ED on 4/7 with worsening pain and nausea with non-bloody emesis.  No diarrhea.  Notes fevers.  Upon presentation to the emergency department he was found to have elevated lactic acid up to 3.2, elevated LFTs with AST of 145, ALT 65, alk phos 199, total bili 4.4.  Overall LFTs are stable today.  Lipase is normal.  Has acute kidney injury.  White blood cell count was initially elevated at 13 K.  Has normalized this morning.  He has been given IV antibiotics and remains on IV Zosyn.  Blood cultures positive for gram negative rods.  CTA chest, abdomen, and pelvis showed the following:  Apparent 6 mm stone at the ampulla, with significant common bile duct and main pancreatic duct dilatation. No discrete pancreatic Mass.  CBD 15 mm.  He was transferred to the stepdown unit overnight for A. fib with RVR.  He is hypotensive and on pressors in the form of norepinephrine.  Troponin significantly elevated over 1K.  He is still tachy up to 120's at times.  Cardiology is consulting.  He denies any abdominal pain.  He said that he just has a really bad headache right now and keeps his eyes closed during the entirety of the visit.  Past Medical History:  Diagnosis Date  . Allergy   . COPD (chronic obstructive pulmonary disease) ([place])   . GERD (gastroesophageal reflux disease)   . History of pulmonary embolus (PE)   . Hypertension   . Insomnia     Past Surgical History:   Procedure Laterality Date  . CHOLECYSTECTOMY      Prior to Admission medications   Medication Sig Start Date End Date Taking? Authorizing Provider  albuterol (VENTOLIN HFA) 108 (90 Base) MCG/ACT inhaler Inhale 2 puffs into the lungs every 4 (four) hours as needed for wheezing or shortness of breath. 06/03/20  Yes [provider]  clonazePAM (KLONOPIN) 1 MG tablet Take 1 mg by mouth at bedtime as needed for anxiety. 06/08/20  Yes [provider]  cyanocobalamin 1000 MCG tablet Take 1,000 mcg by mouth daily.   Yes [provider]  DALIRESP 250 MCG TABS Take 250 mcg by mouth daily. 06/17/20  Yes [provider]  donepezil (ARICEPT) 10 MG tablet Take 10 mg by mouth daily. 05/20/20  Yes [provider]  famotidine (PEPCID) 20 MG tablet Take 20 mg by mouth daily.   Yes [provider]  fluticasone (FLONASE) 50 MCG/ACT nasal spray Place 1 spray into both nostrils daily as needed for allergies or rhinitis. 04/08/20  Yes [provider]  Fluticasone-Umeclidin-Vilant 100-62.5-25 MCG/INH AEPB Inhale 1 puff into the lungs daily.   Yes [provider]  gabapentin (NEURONTIN) 300 MG capsule Take 300 mg by mouth 4 (four) times daily.   Yes [provider]  HYDROcodone-acetaminophen (NORCO) 10-325 MG tablet Take 1 tablet by mouth every 6 (six) hours as needed for severe pain.   Yes [provider]  Iron-Folic Acid-Vit  B12 (IRON FORMULA PO) Take 81 mg by mouth 2 (two) times daily.   Yes [provider]  mometasone (NASONEX) 50 MCG/ACT nasal spray Place 2 sprays into the nose daily.   Yes [provider]  nystatin cream (MYCOSTATIN) Apply 1 application topically 2 (two) times daily. 06/08/20  Yes [provider]  omeprazole (PRILOSEC) 40 MG capsule Take 1 capsule (40 mg total) by mouth daily. 11/21/16  Yes Armbruster, Carlota Raspberry, MD  potassium chloride SA (KLOR-CON) 20 MEQ tablet Take 20 mEq by mouth daily.  05/21/20  Yes [provider]  tamsulosin (FLOMAX) 0.4 MG CAPS capsule Take 0.4 mg by mouth daily. 03/18/16  Yes [provider]  triamcinolone ointment (KENALOG) 0.1 % Apply 1 application topically daily as needed. 04/13/20  Yes [provider]  VITAMIN D, ERGOCALCIFEROL, PO Take 5,000 Units by mouth daily.   Yes [provider]    Current Facility-Administered Medications  Medication Dose Route Frequency Provider Last Rate Last Admin  . 0.9 %  sodium chloride infusion   Intravenous Continuous Elodia Florence., MD      . 0.9 %  sodium chloride infusion  250 mL Intravenous Continuous Elodia Florence., MD      . acetaminophen (TYLENOL) tablet 650 mg  650 mg Oral Q4H PRN Wofford, Cindie Laroche, RPH       Or  . acetaminophen (TYLENOL) suppository 650 mg  650 mg Rectal Q4H PRN Wofford, Drew A, RPH      . albuterol (PROVENTIL) (2.5 MG/3ML) 0.083% nebulizer solution 2.5 mg  2.5 mg Nebulization Q4H PRN Elodia Florence., MD      . amiodarone (NEXTERONE PREMIX) 360-4.14 MG/200ML-% (1.8 mg/mL) IV infusion  60 mg/hr Intravenous Continuous Merlene Laughter F, NP 33.3 mL/hr at 06/19/20 0857 60 mg/hr at 06/19/20 0857   Followed by  . amiodarone (NEXTERONE PREMIX) 360-4.14 MG/200ML-% (1.8 mg/mL) IV infusion  30 mg/hr Intravenous Continuous Merlene Laughter F, NP      . chlorhexidine (PERIDEX) 0.12 % solution 15 mL  15 mL Mouth Rinse BID Blount, Lolita Cram, NP      . Chlorhexidine Gluconate Cloth 2 % PADS 6 each  6 each Topical Daily Blount, Scarlette Shorts T, NP      . clonazePAM (KLONOPIN) tablet 1 mg  1 mg Oral QHS PRN Elodia Florence., MD   1 mg at 06/18/20 2306  . donepezil (ARICEPT) tablet 10 mg  10 mg Oral Daily Elodia Florence., MD   10 mg at 06/18/20 2053  . fluticasone (FLONASE) 50 MCG/ACT nasal spray 1 spray  1 spray Each Nare Daily PRN Elodia Florence., MD      . fluticasone furoate-vilanterol (BREO ELLIPTA) 100-25 MCG/INH 1 puff  1 puff Inhalation Daily  Elodia Florence., MD      . gabapentin (NEURONTIN) capsule 300 mg  300 mg Oral TID WC & HS Elodia Florence., MD   300 mg at 06/18/20 2219  . heparin ADULT infusion 100 units/mL (25000 units/276m)  1,300 Units/hr Intravenous Continuous BEudelia Bunch RPH      . HYDROmorphone (DILAUDID) injection 0.5 mg  0.5 mg Intravenous Q3H PRN PElodia Florence, MD       Or  . HYDROmorphone (DILAUDID) injection 1 mg  1 mg Intravenous Q3H PRN PElodia Florence, MD   1 mg at 06/19/20 0011  . magnesium sulfate IVPB 2 g 50 mL  2 g  Intravenous Once Elodia Florence., MD 50 mL/hr at 06/19/20 0846 2 g at 06/19/20 0846  . MEDLINE mouth rinse  15 mL Mouth Rinse q12n4p Blount, Scarlette Shorts T, NP      . norepinephrine (LEVOPHED) 57m in 2553mpremix infusion  2-10 mcg/min Intravenous Titrated PoElodia Florence MD 33.8 mL/hr at 06/19/20 0637 9 mcg/min at 06/19/20 0637  . ondansetron (ZOFRAN) tablet 4 mg  4 mg Oral Q6H PRN PoElodia Florence MD       Or  . ondansetron ([facility] 4 mg  4 mg Intravenous Q6H PRN PoElodia Florence MD   4 mg at 06/18/20 2225  . pantoprazole (PROTONIX) EC tablet 40 mg  40 mg Oral Daily PoElodia Florence MD   40 mg at 06/18/20 2052  . piperacillin-tazobactam (ZOSYN) IVPB 3.375 g  3.375 g Intravenous Q8H PoElodia Florence MD   Stopped at 06/19/20 [id number]. roflumilast (DALIRESP) tablet 250 mcg  250 mcg Oral Daily PoElodia Florence MD   250 mcg at 06/18/20 2219  . sodium chloride 0.9 % bolus 1,000 mL  1,000 mL Intravenous Once PRN BlLovey Newcomer, NP      . tamsulosin (FLOMAX) capsule 0.4 mg  0.4 mg Oral Daily PoElodia Florence MD   0.4 mg at 06/18/20 2052  . umeclidinium bromide (INCRUSE ELLIPTA) 62.5 MCG/INH 1 puff  1 puff Inhalation Daily PoElodia Florence MD      . vitamin B-12 (CYANOCOBALAMIN) tablet 1,000 mcg  1,000 mcg Oral Daily PoElodia Florence MD   1,000 mcg at 06/18/20 2053    Allergies as of 06/18/2020   . (No Known Allergies)    Family History  Problem Relation Age of Onset  . Colon cancer Neg Hx     Social History   Socioeconomic History  . Marital status: Divorced    Spouse name: Not on file  . Number of children: Not on file  . Years of education: Not on file  . Highest education level: Not on file  Occupational History  . Not on file  Tobacco Use  . Smoking status: Former SmResearch scientist (life sciences). Smokeless tobacco: Not on file  Substance and Sexual Activity  . Alcohol use: Not Currently  . Drug use: Not on file  . Sexual activity: Not on file  Other Topics Concern  . Not on file  Social History Narrative  . Not on file   Social Determinants of Health   Financial Resource Strain: Not on file  Food Insecurity: Not on file  Transportation Needs: Not on file  Physical Activity: Not on file  Stress: Not on file  Social Connections: Not on file  Intimate Partner Violence: Not on file    Review of Systems: ROS is O/W negative except as mentioned in HPI.  Physical Exam: Vital signs in last 24 hours: Temp:  [98.1 F (36.7 C)-101.6 F (38.7 C)] 99.1 F (37.3 C) (04/08 0800) Pulse Rate:  [83-127] 122 (04/08 0645) Resp:  [14-30] 21 (04/08 0645) BP: (59-128)/(35-83) 97/41 (04/08 0645) SpO2:  [89 %-99 %] 93 % (04/08 0645) Weight:  [86.2 kg-86.5 kg] 86.5 kg (04/07 2030) Last BM Date:  (Unknown) General:  Alert, Well-developed, well-nourished, pleasant and cooperative in NAD; appears a little uncomfortable Head:  Normocephalic and atraumatic. Eyes:  Slight scleral icterus noted. Ears:  Normal auditory acuity. Mouth:  No deformity or lesions. Lungs:  Clear throughout to auscultation.  No wheezes, crackles, or rhonchi.  Heart:  Irregularly irregular. Abdomen:  Soft, non-distended.  BS present.  Non-tender.  Msk:  Symmetrical without gross deformities. Pulses:  Normal pulses noted. Extremities:  Without clubbing or edema. Neurologic:  Alert and oriented x 4;  grossly normal  neurologically. Skin:  Intact without significant lesions or rashes. Psych:  Alert and cooperative. Normal mood and affect.  Intake/Output from previous day: 04/07 0701 - 04/08 0700 In: 1678.2 [I.V.:326.4; IV Piggyback:1351.8] Out: 350 [Urine:350]  Lab Results: Recent Labs    06/18/20 1353 06/18/20 1407 06/19/20 0431  WBC 13.1*  --  10.3  HGB 11.0* 11.6* 9.5*  HCT 33.7* 34.0* 29.5*  PLT 163  --  118*   BMET Recent Labs    06/18/20 1353 06/18/20 1407 06/19/20 0431  NA 131* 130* 134*  K 3.9 3.9 3.5  CL 94* 93* 100  CO2 23  --  22  GLUCOSE 61* 58* 60*  BUN 27* 25* 31*  CREATININE 2.04* 2.10* 1.65*  CALCIUM 8.1*  --  8.3*   LFT Recent Labs    06/19/20 0431  PROT 5.3*  ALBUMIN 2.1*  AST 110*  ALT 54*  ALKPHOS 192*  BILITOT 4.1*   Studies/Results: CT ABDOMEN PELVIS WO CONTRAST  Result Date: 06/19/2020 CLINICAL DATA:  Acute nonlocalized abdominal pain, fever, lethargy EXAM: CT ABDOMEN AND PELVIS WITHOUT CONTRAST TECHNIQUE: Multidetector CT imaging of the abdomen and pelvis was performed following the standard protocol without IV contrast. COMPARISON:  CT a chest abdomen and pelvis performed yesterday FINDINGS: Lower chest: Extensive lower lobe bronchial wall thickening. Slightly increased atelectasis compared to yesterday. Background paraseptal and centrilobular emphysema again noted. The visualized heart remains normal in size. Trace pericardial fluid versus thickening. Small hiatal hernia. Hepatobiliary: No focal liver abnormality is seen. Status post cholecystectomy. Stone again noted in the distal common bile duct. Persistent mild biliary ductal dilatation. Pancreas: Pancreatic atrophy. Punctate calcifications in the pancreatic head and uncinate process suggest prior pancreatitis. Spleen: Normal in size without focal abnormality. Adrenals/Urinary Tract: Normal adrenal glands. Persistent contrast within the kidneys and renal collecting system. No bladder mass visualized.  Stomach/Bowel: Colonic diverticular disease without CT evidence of active inflammation. Normal appendix. No evidence of bowel obstruction. Vascular/Lymphatic: Limited evaluation in the absence of intravenous contrast. Atherosclerotic calcifications throughout the aorta. Reproductive: Prostate is unremarkable. Other: No abdominal wall hernia or ascites. Musculoskeletal: No acute fracture or aggressive appearing lytic or blastic osseous lesion. IMPRESSION: 1. Minimal interval change compared to CT imaging performed yesterday. 2. Persistent distal common bile duct stone with associated mild biliary ductal dilatation. 3. Increasing bibasilar atelectasis. 4. Chronic bronchitic changes and emphysema suggests underlying COPD. 5. Additional ancillary findings as above without significant interval change. Electronically Signed   By: Jacqulynn Cadet M.D.   On: 06/19/2020 06:37   DG CHEST PORT 1 VIEW  Result Date: 06/19/2020 CLINICAL DATA:  Respiratory tract congestion. EXAM: PORTABLE CHEST 1 VIEW COMPARISON:  June 18, 2020. FINDINGS: Stable cardiomediastinal silhouette. No pneumothorax is noted. Stable bibasilar opacities are noted concerning for atelectasis possibly infiltrates. Bony thorax is unremarkable. IMPRESSION: Stable bibasilar opacities as described above. Electronically Signed   By: Marijo Conception M.D.   On: 06/19/2020 08:28   DG Chest Port 1 View  Result Date: 06/18/2020 CLINICAL DATA:  Cough and short of breath EXAM: PORTABLE CHEST 1 VIEW COMPARISON:  05/08/2020 FINDINGS: COPD with pulmonary scarring similar to the prior study. Mild left lower lobe airspace disease is new since the prior  study. Possible small left effusion. IMPRESSION: Mild left lower lobe atelectasis/infiltrate. Possible small left effusion COPD with scarring. Electronically Signed   By: Franchot Gallo M.D.   On: 06/18/2020 14:13   CT Angio Chest/Abd/Pel for Dissection W and/or W/WO  Result Date: 06/18/2020 CLINICAL DATA:  Epigastric  abdominal pain radiating to the upper back and shoulder. Hypotension. EXAM: CT ANGIOGRAPHY CHEST, ABDOMEN AND PELVIS TECHNIQUE: Non-contrast CT of the chest was initially obtained. Multidetector CT imaging through the chest, abdomen and pelvis was performed using the standard protocol during bolus administration of intravenous contrast. Multiplanar reconstructed images and MIPs were obtained and reviewed to evaluate the vascular anatomy. CONTRAST:  48m OMNIPAQUE IOHEXOL 350 MG/ML SOLN COMPARISON:  05/27/2016 chest CT angiogram. 08/01/2016 PET-CT. [id number] abdomen/pelvis. FINDINGS: CTA CHEST FINDINGS Motion degraded scan limits assessment. Cardiovascular: Normal heart size. No significant pericardial effusion/thickening. Three-vessel coronary atherosclerosis. Patent aortic arch branch vessels. Atherosclerotic nonaneurysmal thoracic aorta. No acute intramural hematoma, dissection, pseudoaneurysm or penetrating atherosclerotic ulcer in the thoracic aorta. Top-normal caliber main pulmonary artery (3.4 cm diameter). No central pulmonary emboli. Mediastinum/Nodes: No discrete thyroid nodules. Unremarkable esophagus. No pathologically enlarged axillary, mediastinal or hilar lymph nodes. Lungs/Pleura: No pneumothorax. No pleural effusion. Severe centrilobular and paraseptal emphysema with diffuse bronchial wall thickening. No acute consolidative airspace disease, lung masses or significant pulmonary nodules. Musculoskeletal: No aggressive appearing focal osseous lesions. Mild thoracic spondylosis. Review of the MIP images confirms the above findings. CTA ABDOMEN AND PELVIS FINDINGS VASCULAR Aorta: Atherosclerotic nonaneurysmal abdominal aorta with no dissection or significant stenosis. Celiac: Approximately 60% proximal atherosclerotic stenosis. No aneurysm or dissection. SMA: Patent without evidence of aneurysm, dissection, vasculitis or significant stenosis. Renals: Approximately 50% atherosclerotic proximal stenoses in  the bilateral renal arteries. IMA: Patent without evidence of aneurysm, dissection, vasculitis or significant stenosis. Inflow: Focal high-grade atherosclerotic stenosis in the right common femoral artery (series 6/image 187). Otherwise patent without dissection or aneurysm. Veins: No obvious venous abnormality within the limitations of this arterial phase study. Review of the MIP images confirms the above findings. NON-VASCULAR Hepatobiliary: Normal liver with no liver mass. Cholecystectomy. There is apparent 6 mm stone at the ampulla (series 6/image 130). Common bile duct diameter 15 mm. Mild diffuse central intrahepatic biliary ductal dilatation. Pancreas: Newly dilated main pancreatic duct (5 mm diameter). No discrete pancreatic mass. Spleen: Normal size. No mass. Adrenals/Urinary Tract: Normal adrenals. No hydronephrosis. No contour deforming renal masses. Normal bladder. Stomach/Bowel: Small hiatal hernia. Otherwise normal nondistended stomach. Normal caliber small bowel with no small bowel wall thickening. Scattered tiny calcified stones in the otherwise normal appendix. Marked sigmoid diverticulosis with no large bowel wall thickening or significant pericolonic fat stranding. Vascular/Lymphatic: No pathologically enlarged lymph nodes in the abdomen or pelvis. Reproductive: Normal size prostate. Other: No pneumoperitoneum, ascites or focal fluid collection. Musculoskeletal: No aggressive appearing focal osseous lesions. Marked lumbar spondylosis. Review of the MIP images confirms the above findings. IMPRESSION: 1. No acute aortic syndrome. 2. Apparent 6 mm stone at the ampulla, with significant common bile duct and main pancreatic duct dilatation. No discrete pancreatic mass. Recommend correlation with serum bilirubin levels. GI consultation for consideration of ERCP suggested. 3. Focal high-grade atherosclerotic stenosis in the right common femoral artery. 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  Approximately 50% proximal bilateral renal artery stenoses. 4. Three-vessel coronary atherosclerosis. 5. Small hiatal hernia. 6. Marked sigmoid diverticulosis. 7. Aortic Atherosclerosis (ICD10-I70.0) and Emphysema (ICD10-J43.9). Electronically Signed   By: JIlona SorrelM.D.   On: 06/18/2020 16:34   IMPRESSION:  -  70 year old male with 2.5 weeks of intermittent epigastric abdominal pain that worsened in intensity and was associated with nausea and vomiting.  Upon presentation had elevated LFTs and CT scan showing CBD dilation 6 mm stone at the ampulla.  Concern for cholangitis.  Has gram-negative rod bacteremia.  On appropriate IV antibiotics. -Atrial fibrillation with RVR: Transferred to stepdown unit overnight.  Is on amiodarone.  Troponin also significantly elevated.  Cardiology is consulting. -Acute kidney injury: Improving with IV hydration. -COPD on home O2 at night  PLAN: -Patient needs ERCP.  Due to his cardiac issues requiring transfer to stepdown unit overnight we are waiting for cardiology evaluation and hopeful that they will get his heart rate controlled.  Tentatively we are planning for 730 tomorrow morning.  They are placing him on IV heparin so I already spoke with the pharmacist who will tentatively plan to have that held at 1:30 AM. -Otherwise continue IV antibiotics and supportive care. -Trend labs/LFTs.   Laban Emperor. Zehr  06/19/2020, 9:17 AM  ________________________________________________________________________  Velora Heckler GI MD note:  I personally examined the patient, reviewed the data and agree with the assessment and plan described above.  70 yo man with remote lap chole, now with elevated LFTs, WBC, dilated biliary and pancreatic ducts with apparent calcified stone in distal CBD or ampulla.  This acute illness has led to afib with RVR, cardiac strain. Currently heparinized, on amio which is controlling his rate.  I am planning ERCP tomorrow AM, heparin will be held 6 hours  prior, continue broad spect IV abx for now.  I will allow clears until midnight.   Owens Loffler, MD [facility] Pager [phone]

## 2020-06-19 NOTE — Progress Notes (Addendum)
70 yo with hx COPD on 3 L at night, GERD, HTN, pancreatitis, hx PE, s/p cholecystectomy who presented with >2 weeks of abdominal pain, presented with sepsis 2/2 cholangitis.  Overnight developed afib with RVR, worsening hypotension requiring pressors.  PCCM consulted overnight.  Given continued requirement for pressors will sign off.  Please let us know when we can be of assistance.  GI has been consulted and should be seeing Warren [name] today.  Discussed with PCCM.

## 2020-06-19 NOTE — Consult Note (Signed)
NAME:  Warren [name] MRN:  [id number] DOB:  Nov 23, 1950 LOS: 1 ADMISSION DATE:  06/18/2020  CONSULTATION DATE:  06/19/2020 REFERRING MD:  Lovey Newcomer, NP  REASON FOR CONSULTATION:  Hypotension/septic shock   Initial Pulmonary/Critical Care Consultation  Brief History   N/A  History of present illness   This 70 y.o. male is seen in consultation at the request of Xenia blunt, NP for recommendations on further evaluation and management of hypotension/shock.  The patient was admitted earlier in the evening (06/18/2020) with complaints of 2-1/2 weeks of abdominal pain.  He was diagnosed with cholangitis and was scheduled for ERCP later this morning.  Overnight, the patient became progressively hypotensive despite administration of up to 3 L IV fluids.  Orders were placed to start low-dose norepinephrine.  He is seen after returning from CT.  Of note, the patient has a remote history of cholecystectomy.  In the emergency department, CT angio chest/abdomen/pelvis was obtained on 06/18/2020 and showed an apparent 6 mm stone at the ampulla.  CBD dilatation 15 mm.  Newly dilated main pancreatic duct (5 mm).  He was given a single dose of cefepime/Flagyl/vancomycin in the emergency department.  He has subsequently been started on Zosyn.  He was admitted to the hospitalist service with a working diagnosis of sepsis secondary to cholangitis.  Gastroenterology has been consulted.   REVIEW OF SYSTEMS  Constitutional: No weight loss. No night sweats. No fever. No chills. No fatigue. HEENT: Headaches.  No dysphagia, sore throat, otalgia, nasal congestion, PND CV:  Chest pain.  Palpitations.  No orthopnea, paroxysmal nocturnal dyspnea, swelling in lower extremities. GI:  Abdominal pain.  No nausea, vomiting, diarrhea, change in bowel pattern, anorexia. Resp: No DOE, rest dyspnea, cough, mucus, hemoptysis, wheezing  GU: no dysuria, change in color of urine, no urgency or frequency.  No flank pain. MS:  L shoulder  pain.  No joint pain or swelling. No myalgias,  No decreased range of motion.  Psych:  No change in mood or affect. No memory loss. Skin: no rash or lesions.   Past Medical/Surgical/Social/Family History   Past Medical History:  Diagnosis Date  . Allergy   . COPD (chronic obstructive pulmonary disease) ([place])   . GERD (gastroesophageal reflux disease)   . History of pulmonary embolus (PE)   . Hypertension   . Insomnia     Past Surgical History:  Procedure Laterality Date  . CHOLECYSTECTOMY      Social History   Tobacco Use  . Smoking status: Former Research scientist (life sciences)  . Smokeless tobacco: Not on file  Substance Use Topics  . Alcohol use: Not Currently    Family History  Problem Relation Age of Onset  . Colon cancer Neg Hx      Significant Hospital Events      Consults:  GI PCCM   Procedures:  ERCP scheduled for 4/8   Significant Diagnostic Tests:  CTA chest/abdomen/pelvis (4/7): 6 mm stone at the ampulla; CBD dilatation, 15 mm; main pancreatic duct dilatation, 5 mm.   Micro Data:   Results for orders placed or performed during the hospital encounter of 06/18/20  Resp Panel by RT-PCR (Flu A&B, Covid) Nasopharyngeal Swab     Status: None   Collection Time: 06/18/20  5:55 PM   Specimen: Nasopharyngeal Swab; Nasopharyngeal(NP) swabs in vial transport medium  Result Value Ref Range Status   SARS Coronavirus 2 by RT PCR NEGATIVE NEGATIVE Final    Comment: (NOTE) SARS-CoV-2 target nucleic acids are NOT DETECTED.  The SARS-CoV-2 RNA is generally detectable in upper respiratory specimens during the acute phase of infection. The lowest concentration of SARS-CoV-2 viral copies this assay can detect is 138 copies/mL. A negative result does not preclude SARS-Cov-2 infection and should not be used as the sole basis for treatment or other patient management decisions. A negative result may occur with  improper specimen collection/handling, submission of specimen other than  nasopharyngeal swab, presence of viral mutation(s) within the areas targeted by this assay, and inadequate number of viral copies(<138 copies/mL). A negative result must be combined with clinical observations, patient history, and epidemiological information. The expected result is Negative.  Fact Sheet for Patients:  EntrepreneurPulse.com.au  Fact Sheet for Healthcare Providers:  IncredibleEmployment.be  This test is no t yet approved or cleared by the [place] FDA and  has been authorized for detection and/or diagnosis of SARS-CoV-2 by FDA under an Emergency Use Authorization (EUA). This EUA will remain  in effect (meaning this test can be used) for the duration of the COVID-19 declaration under Section 564(b)(1) of the Act, 21 U.S.C.section 360bbb-3(b)(1), unless the authorization is terminated  or revoked sooner.       Influenza A by PCR NEGATIVE NEGATIVE Final   Influenza B by PCR NEGATIVE NEGATIVE Final    Comment: (NOTE) The Xpert Xpress SARS-CoV-2/FLU/RSV plus assay is intended as an aid in the diagnosis of influenza from Nasopharyngeal swab specimens and should not be used as a sole basis for treatment. Nasal washings and aspirates are unacceptable for Xpert Xpress SARS-CoV-2/FLU/RSV testing.  Fact Sheet for Patients: EntrepreneurPulse.com.au  Fact Sheet for Healthcare Providers: IncredibleEmployment.be  This test is not yet approved or cleared by the [place] FDA and has been authorized for detection and/or diagnosis of SARS-CoV-2 by FDA under an Emergency Use Authorization (EUA). This EUA will remain in effect (meaning this test can be used) for the duration of the COVID-19 declaration under Section 564(b)(1) of the Act, 21 U.S.C. section 360bbb-3(b)(1), unless the authorization is terminated or revoked.  Performed at [facility], [place] [address] [place], [place] [address]       Antimicrobials:  Cefepime/Flagyl/vancomycin (4/7) Zosyn (4/8>>)  Interim history/subjective:  N/A   Objective   BP (!) 64/35   Pulse 94   Temp (!) 101.6 F (38.7 C) (Axillary)   Resp (!) 25   Ht 5\' 11"  (1.803 m)   Wt 86.5 kg   SpO2 96%   BMI 26.60 kg/m     Filed Weights   06/18/20 1255 06/18/20 1305 06/18/20 2030  Weight: 86.2 kg 86.2 kg 86.5 kg    Intake/Output Summary (Last 24 hours) at 06/19/2020 0615 Last data filed at 06/19/2020 0546 Gross per 24 hour  Intake 1653.32 ml  Output 350 ml  Net 1303.32 ml        Examination: GENERAL: alert, oriented to time, person and place, pleasant or well-developed. No acute distress. HEAD: normocephalic, atraumatic EYE: PERRLA, EOM intact, no scleral icterus, no pallor. NOSE: nares are patent. No polyps. No exudate. No sinus tenderness. THROAT/ORAL CAVITY: Normal dentition. No oral thrush. No exudate. Mucous membranes are moist. No tonsillar enlargement. NECK: supple, no thyromegaly, no JVD, no lymphadenopathy. Trachea midline. CHEST/LUNG: symmetric in development and expansion. Good air entry. No crackles. No wheezes. HEART: Irregularly irregular rate and rhythm.  No murmur, rub or gallop. ABDOMEN: soft, mildly distended. Tender.  No rebound.  Normoactive bowel sounds.  No guarding. No hepatosplenomegaly. EXTREMITIES: Edema: Trace. No cyanosis. No clubbing.  2+ DP pulses LYMPHATIC: no cervical/axillary/inguinal lymph nodes appreciated MUSCULOSKELETAL: No point tenderness. No bulk atrophy. Joints: normal inspection.  SKIN:  No rash or lesion. NEUROLOGIC: Doll's eyes intact. Corneal reflex intact. Spontaneous respirations intact. Cranial nerves II-XII are grossly symmetric and physiologic. Babinski absent. No sensory deficit. Motor: 5/5 @ RUE, 5/5 @ LUE, 5/5 @ RLL,  5/5 @ LLL.  DTR: 2+ @ R biceps, 2+ @ L biceps, 2+ @ R patellar,  2+ @ L patellar. No cerebellar signs. Gait was not  assessed.   Resolved Hospital Problem list      Assessment & Plan:   ASSESSMENT/PLAN:  ASSESSMENT (included in the Hospital Problem List)  Principal Problem:   Choledocholithiasis with obstruction Active Problems:   Atrial fibrillation with rapid ventricular response (HCC)   Septic shock (HCC)   COPD (chronic obstructive pulmonary disease) (HCC)   GERD (gastroesophageal reflux disease)   S/P cholecystectomy   History of pancreatitis   History of pulmonary embolism   By systems: PULMONARY  COPD   CARDIOVASCULAR  Shock, likely septic.  However, with new onset atrial fibrillation with rapid ventricular response, cardiogenic etiology should be ruled out.  Atrial fibrillation with rapid ventricular response, new onset  Hypertension at baseline   RENAL  CKD, stage 3b   GASTROINTESTINAL  Choledocholithiasis   Possible cholangitis  GERD  GI PROPHYLAXIS: Protonix   HEMATOLOGIC  History of venous thromboembolism  DVT PROPHYLAXIS: Heparin   INFECTIOUS  Possible septic shock, likely secondary to hepatobiliary obstruction   ENDOCRINE: No acute issues   NEUROLOGIC: No acute issues   PLAN/RECOMMENDATIONS   Transfer to ICU for further evaluation and management of septic shock.  IV fluids: LR @ 100 mL/hr  Meds: continue Zosyn  12-lead EKG.  Cycle troponin.  Check proBNP.  This patient has new onset atrial fibrillation with rapid ventricular response.    Digoxin 0.25 mg IV single dose for attempted rate control.  Monitor blood pressure response.  Currently on norepinephrine at 9 mcg/min.  PICC line placement.  NUTRITION: NPO in anticipation of ERCP if hemodynamically stable  DuoNeb/Pulmicort.    My assessment, plan of care, findings, medications, side effects, etc. were discussed with: nurse.   Best practice:  Diet: NPO Pain/Anxiety/Delirium protocol (if indicated): N/A VAP protocol (if indicated): N/A DVT prophylaxis: heparin GI prophylaxis:  Protonix Glucose control: N/A Mobility/Activity: Bedrest for now CODE STATUS: FULL CODE Family Communication:  no family at bedside Disposition: ICU   Labs   CBC: Recent Labs  Lab 06/18/20 1353 06/18/20 1407 06/19/20 0431  WBC 13.1*  --  10.3  NEUTROABS 12.4*  --  9.4*  HGB 11.0* 11.6* 9.5*  HCT 33.7* 34.0* 29.5*  MCV 101.8*  --  102.1*  PLT 163  --  118*    Basic Metabolic Panel: Recent Labs  Lab 06/18/20 1353 06/18/20 1407 06/19/20 0431  NA 131* 130* 134*  K 3.9 3.9 3.5  CL 94* 93* 100  CO2 23  --  22  GLUCOSE 61* 58* 60*  BUN 27* 25* 31*  CREATININE 2.04* 2.10* 1.65*  CALCIUM 8.1*  --  8.3*  MG  --   --  1.3*  PHOS  --   --  2.1*   GFR: Estimated Creatinine Clearance: 43.7 mL/min (A) (by C-G formula based on SCr of 1.65 mg/dL (H)). Recent Labs  Lab 06/18/20 1353 06/18/20 1411 06/18/20 1626 06/18/20 1850 06/19/20 0431 06/19/20 0438  WBC 13.1*  --   --   --  10.3  --  LATICACIDVEN  --  3.2* 2.9* 1.6  --  3.0*    Liver Function Tests: Recent Labs  Lab 06/18/20 1353 06/19/20 0431  AST 145* 110*  ALT 65* 54*  ALKPHOS 199* 192*  BILITOT 4.1* 4.1*  PROT 5.9* 5.3*  ALBUMIN 2.5* 2.1*   Recent Labs  Lab 06/18/20 1353 06/19/20 0431  LIPASE 37 40   No results for input(s): AMMONIA in the last 168 hours.  ABG    Component Value Date/Time   HCO3 26.4 (H) 03/05/2007 0615   TCO2 24 06/18/2020 1407     Coagulation Profile: No results for input(s): INR, PROTIME in the last 168 hours.  Cardiac Enzymes: No results for input(s): CKTOTAL, CKMB, CKMBINDEX, TROPONINI in the last 168 hours.  HbA1C: No results found for: HGBA1C  CBG: Recent Labs  Lab 06/18/20 1719 06/18/20 1759 06/19/20 0030  GLUCAP 83 75 105*     Review of Systems:   See above   Past Medical History   Past Medical History:  Diagnosis Date  . Allergy   . COPD (chronic obstructive pulmonary disease) ([place])   . GERD (gastroesophageal reflux disease)   . History of  pulmonary embolus (PE)   . Hypertension   . Insomnia       Surgical History    Past Surgical History:  Procedure Laterality Date  . CHOLECYSTECTOMY        Social History   Social History   Socioeconomic History  . Marital status: Divorced    Spouse name: Not on file  . Number of children: Not on file  . Years of education: Not on file  . Highest education level: Not on file  Occupational History  . Not on file  Tobacco Use  . Smoking status: Former Research scientist (life sciences)  . Smokeless tobacco: Not on file  Substance and Sexual Activity  . Alcohol use: Not Currently  . Drug use: Not on file  . Sexual activity: Not on file  Other Topics Concern  . Not on file  Social History Narrative  . Not on file   Social Determinants of Health   Financial Resource Strain: Not on file  Food Insecurity: Not on file  Transportation Needs: Not on file  Physical Activity: Not on file  Stress: Not on file  Social Connections: Not on file      Family History    Family History  Problem Relation Age of Onset  . Colon cancer Neg Hx    family history is negative for Colon cancer.    Allergies No Known Allergies    Current Medications  Current Facility-Administered Medications:  .  0.9 %  sodium chloride infusion, , Intravenous, Continuous, Florene Glen, A Clint Lipps., MD .  0.9 %  sodium chloride infusion, 250 mL, Intravenous, Continuous, Florene Glen, A Clint Lipps., MD .  albuterol (PROVENTIL) (2.5 MG/3ML) 0.083% nebulizer solution 2.5 mg, 2.5 mg, Nebulization, Q4H PRN, Elodia Florence., MD .  chlorhexidine (PERIDEX) 0.12 % solution 15 mL, 15 mL, Mouth Rinse, BID, Blount, Lolita Cram, NP .  Chlorhexidine Gluconate Cloth 2 % PADS 6 each, 6 each, Topical, Daily, Blount, Scarlette Shorts T, NP .  clonazePAM (KLONOPIN) tablet 1 mg, 1 mg, Oral, QHS PRN, Elodia Florence., MD, 1 mg at 06/18/20 2306 .  dextrose 5 % in lactated ringers infusion, , Intravenous, Continuous, Elodia Florence., MD, Stopped at  06/18/20 2020 .  donepezil (ARICEPT) tablet 10 mg, 10 mg, Oral, Daily, Elodia Florence., MD, 10  mg at 06/18/20 2053 .  fluticasone (FLONASE) 50 MCG/ACT nasal spray 1 spray, 1 spray, Each Nare, Daily PRN, Elodia Florence., MD .  fluticasone furoate-vilanterol (BREO ELLIPTA) 100-25 MCG/INH 1 puff, 1 puff, Inhalation, Daily, Elodia Florence., MD .  gabapentin (NEURONTIN) capsule 300 mg, 300 mg, Oral, TID WC & HS, Elodia Florence., MD, 300 mg at 06/18/20 2219 .  HYDROmorphone (DILAUDID) injection 0.5 mg, 0.5 mg, Intravenous, Q3H PRN **OR** HYDROmorphone (DILAUDID) injection 1 mg, 1 mg, Intravenous, Q3H PRN, Elodia Florence., MD, 1 mg at 06/19/20 0011 .  lactated ringers bolus 1,000 mL, 1,000 mL, Intravenous, Once, Elodia Florence., MD .  MEDLINE mouth rinse, 15 mL, Mouth Rinse, q12n4p, Blount, Scarlette Shorts T, NP .  norepinephrine (LEVOPHED) 4mg  in 246mL premix infusion, 2-10 mcg/min, Intravenous, Titrated, Elodia Florence., MD, Last Rate: 22.5 mL/hr at 06/19/20 0546, 6 mcg/min at 06/19/20 0546 .  ondansetron (ZOFRAN) tablet 4 mg, 4 mg, Oral, Q6H PRN **OR** ondansetron (ZOFRAN) injection 4 mg, 4 mg, Intravenous, Q6H PRN, Elodia Florence., MD, 4 mg at 06/18/20 2225 .  pantoprazole (PROTONIX) EC tablet 40 mg, 40 mg, Oral, Daily, Elodia Florence., MD, 40 mg at 06/18/20 2052 .  piperacillin-tazobactam (ZOSYN) IVPB 3.375 g, 3.375 g, Intravenous, Q8H, Elodia Florence., MD, Stopped at 06/19/20 [phone] .  roflumilast (DALIRESP) tablet 250 mcg, 250 mcg, Oral, Daily, Elodia Florence., MD, 250 mcg at 06/18/20 2219 .  sodium chloride 0.9 % bolus 1,000 mL, 1,000 mL, Intravenous, Once PRN, Blount, Xenia T, NP .  tamsulosin (FLOMAX) capsule 0.4 mg, 0.4 mg, Oral, Daily, Elodia Florence., MD, 0.4 mg at 06/18/20 2052 .  umeclidinium bromide (INCRUSE ELLIPTA) 62.5 MCG/INH 1 puff, 1 puff, Inhalation, Daily, Florene Glen, A Clint Lipps., MD .  vitamin B-12  (CYANOCOBALAMIN) tablet 1,000 mcg, 1,000 mcg, Oral, Daily, Elodia Florence., MD, 1,000 mcg at 06/18/20 2053  Home Medications  Prior to Admission medications   Medication Sig Start Date End Date Taking? Authorizing Provider  albuterol (VENTOLIN HFA) 108 (90 Base) MCG/ACT inhaler Inhale 2 puffs into the lungs every 4 (four) hours as needed for wheezing or shortness of breath. 06/03/20  Yes [provider]  clonazePAM (KLONOPIN) 1 MG tablet Take 1 mg by mouth at bedtime as needed for anxiety. 06/08/20  Yes [provider]  cyanocobalamin 1000 MCG tablet Take 1,000 mcg by mouth daily.   Yes [provider]  DALIRESP 250 MCG TABS Take 250 mcg by mouth daily. 06/17/20  Yes [provider]  donepezil (ARICEPT) 10 MG tablet Take 10 mg by mouth daily. 05/20/20  Yes [provider]  famotidine (PEPCID) 20 MG tablet Take 20 mg by mouth daily.   Yes [provider]  fluticasone (FLONASE) 50 MCG/ACT nasal spray Place 1 spray into both nostrils daily as needed for allergies or rhinitis. 04/08/20  Yes [provider]  Fluticasone-Umeclidin-Vilant 100-62.5-25 MCG/INH AEPB Inhale 1 puff into the lungs daily.   Yes [provider]  gabapentin (NEURONTIN) 300 MG capsule Take 300 mg by mouth 4 (four) times daily.   Yes [provider]  HYDROcodone-acetaminophen (NORCO) 10-325 MG tablet Take 1 tablet by mouth every 6 (six) hours as needed for severe pain.   Yes [provider]  Iron-Folic Acid-Vit E31 (IRON FORMULA PO) Take 81 mg by mouth 2 (two) times daily.   Yes [provider]  mometasone (NASONEX) 50  MCG/ACT nasal spray Place 2 sprays into the nose daily.   Yes [provider]  nystatin cream (MYCOSTATIN) Apply 1 application topically 2 (two) times daily. 06/08/20  Yes [provider]  omeprazole (PRILOSEC) 40 MG capsule Take 1 capsule (40 mg total) by mouth daily. 11/21/16  Yes Armbruster, Carlota Raspberry,  MD  potassium chloride SA (KLOR-CON) 20 MEQ tablet Take 20 mEq by mouth daily. 05/21/20  Yes [provider]  tamsulosin (FLOMAX) 0.4 MG CAPS capsule Take 0.4 mg by mouth daily. 03/18/16  Yes [provider]  triamcinolone ointment (KENALOG) 0.1 % Apply 1 application topically daily as needed. 04/13/20  Yes [provider]  VITAMIN D, ERGOCALCIFEROL, PO Take 5,000 Units by mouth daily.   Yes [provider]      Critical care time: 45 minutes.  The treatment and management of the patient's condition was required based on the threat of imminent deterioration. This time reflects time spent by the physician evaluating, providing care and managing the critically ill patient's care. The time was spent at the immediate bedside (or on the same floor/unit and dedicated to this patient's care). Time involved in separately billable procedures is NOT included int he critical care time indicated above. Family meeting and update time may be included above if and only if the patient is unable/incompetent to participate in clinical interview and/or decision making, and the discussion was necessary to determining treatment decisions.    Renee Pain, MD Board Certified by the ABIM, [place]

## 2020-06-19 NOTE — Progress Notes (Signed)
Called by bedside RN with concerns for hypotension, afib RVR, SOB, and fever. On assessment, pt is lethargic and has complaint of pain in upper abdomen and back. He is oriented x3. Complains of not feeling well over all. SBP in 70's, HR 120's, temp is 101.0. Tachypneic w/ RR 26. Pt hemodynamically unstable. Admit to ICU.    Sepsis secondary to Cholangitis  Cholodocholithiasis  Abdominal Pain - BMP, CBC, Lactic acid, D-Dimer - CT of abdomen/pelvis - Transfer pt to ICU  Hypotension - IV fluids at 146ml/hr - Two 1L bolus given with little effect on BP. Will try albumin. Consulting PCCM for further management.  - Albumin IV  SOB/tachypnea - Chest xray stat  PCCM to come to bedside to evaluate.   Lovey Newcomer, NP [facility] 7p-7a [phone]

## 2020-06-19 NOTE — Progress Notes (Signed)
PCCM Progress Note  Patient seen by night team after developing unstable A. fib with rapid ventricular response resulting in hypotension.  Patient is now stable in ICU on low-dose peripheral Levophed.   New onset A. fib with RVR -Patient denies any known history of atrial fibrillation.  Denies any home palpitations or lightheadedness/dizziness -CHA2DS2-VASc 2 Hypotension -Started on peripheral levo overnight  Elevated Troponin -Troponin 1,024, possibly demand ischemia but patient has a HX of CAD.  Plan Add IV amiodarone drip with bolus now Continuous telemetry Trend Troponin Consult cardiology  Stat ECHO  Once central access obtained check [place] Start heparin drip with no bolus given mild thrombocytopenia  Hold on any further IV hydration for now   Johnsie Cancel, NP-C [facility] Personal contact information can be found on Amion  If no response please page: Adult pulmonary and critical care medicine pager on Amion unitl 7pm After 7pm please call [phone] 06/19/2020, 8:27 AM

## 2020-06-19 NOTE — Progress Notes (Signed)
PHARMACY - PHYSICIAN COMMUNICATION CRITICAL VALUE ALERT - BLOOD CULTURE IDENTIFICATION (BCID)  Warren [name] is an 70 y.o. male who presented to [facility] on 06/18/2020 with a chief complaint of abdominal pain  Assessment: sepsis 2nd cholangitis  Name of physician (or Provider) Contacted: A. Olalere  Current antibiotics: Zosyn  Changes to prescribed antibiotics recommended:  Change to Ceftriaxone 2 gm IV q24  Results for orders placed or performed during the hospital encounter of 06/18/20  Blood Culture ID Panel (Reflexed) (Collected: 06/18/2020  4:34 PM)  Result Value Ref Range   Enterococcus faecalis NOT DETECTED NOT DETECTED   Enterococcus Faecium NOT DETECTED NOT DETECTED   Listeria monocytogenes NOT DETECTED NOT DETECTED   Staphylococcus species NOT DETECTED NOT DETECTED   Staphylococcus aureus (BCID) NOT DETECTED NOT DETECTED   Staphylococcus epidermidis NOT DETECTED NOT DETECTED   Staphylococcus lugdunensis NOT DETECTED NOT DETECTED   Streptococcus species NOT DETECTED NOT DETECTED   Streptococcus agalactiae NOT DETECTED NOT DETECTED   Streptococcus pneumoniae NOT DETECTED NOT DETECTED   Streptococcus pyogenes NOT DETECTED NOT DETECTED   A.calcoaceticus-baumannii NOT DETECTED NOT DETECTED   Bacteroides fragilis NOT DETECTED NOT DETECTED   Enterobacterales DETECTED (A) NOT DETECTED   Enterobacter cloacae complex NOT DETECTED NOT DETECTED   Escherichia coli NOT DETECTED NOT DETECTED   Klebsiella aerogenes NOT DETECTED NOT DETECTED   Klebsiella oxytoca DETECTED (A) NOT DETECTED   Klebsiella pneumoniae NOT DETECTED NOT DETECTED   Proteus species NOT DETECTED NOT DETECTED   Salmonella species NOT DETECTED NOT DETECTED   Serratia marcescens NOT DETECTED NOT DETECTED   Haemophilus influenzae NOT DETECTED NOT DETECTED   Neisseria meningitidis NOT DETECTED NOT DETECTED   Pseudomonas aeruginosa NOT DETECTED NOT DETECTED   Stenotrophomonas maltophilia NOT DETECTED NOT DETECTED    Candida albicans NOT DETECTED NOT DETECTED   Candida auris NOT DETECTED NOT DETECTED   Candida glabrata NOT DETECTED NOT DETECTED   Candida krusei NOT DETECTED NOT DETECTED   Candida parapsilosis NOT DETECTED NOT DETECTED   Candida tropicalis NOT DETECTED NOT DETECTED   Cryptococcus neoformans/gattii NOT DETECTED NOT DETECTED   CTX-M ESBL NOT DETECTED NOT DETECTED   Carbapenem resistance IMP NOT DETECTED NOT DETECTED   Carbapenem resistance KPC NOT DETECTED NOT DETECTED   Carbapenem resistance NDM NOT DETECTED NOT DETECTED   Carbapenem resist OXA 48 LIKE NOT DETECTED NOT DETECTED   Carbapenem resistance VIM NOT DETECTED NOT DETECTED    Eudelia Bunch, Pharm.D 06/19/2020 11:26 AM

## 2020-06-19 NOTE — Significant Event (Signed)
Rapid Response Event Note   Reason for Call:  New onset A-fib / RVR and hypotension.  Initial Focused Assessment:  Patient alert, oriented x 4, denies significant pain, has audible congested lung sounds, is wearing 4L/ min oxygen via [place], and EKG in process as RR RN arrives. Bedside RN and unit charge RN at bedside and have alerted on call provider prior to my arrival. HR in low 100's, but SBP in 70's. Patient denies any cardiac history, but is oxygen dependant at 3L/min. EKG confirmed Afib / RVR.     Interventions:  Order already given for 1 Liter LR bolus. Placed on portable telemetry monitor, obtained vital signs, and contacted on call provider again.   Plan of Care:  As BP not yet responding, orders given to transfer to [facility], orders for labs and CXR stat. Labs drawn prior to transport to [place] room 1222. Previous bedside RN to notify family of transfer.    Event Summary:   MD Notified:  Call Time: 0411 Arrival Time: 0375 End Time: 4360 when arrived to room 1222. Bedside report given from previous unit's RN and 2 Psychologist, counselling.  Selinda Michaels, RN

## 2020-06-19 NOTE — Progress Notes (Signed)
ANTICOAGULATION CONSULT NOTE - Brief note  Pharmacy Consult for Heparin Indication: Afib  No Known Allergies  Patient Measurements: Height: 5\' 11"  (180.3 cm) Weight: 86.5 kg (190 lb 11.2 oz) IBW/kg (Calculated) : 75.3 Heparin Dosing Weight: TBW   Medications:  Infusions:  . sodium chloride 10 mL/hr at 06/19/20 0934  . sodium chloride    . amiodarone 30 mg/hr (06/19/20 1438)  . heparin 1,300 Units/hr (06/19/20 1403)  . norepinephrine (LEVOPHED) Adult infusion 7 mcg/min (06/19/20 1403)  . piperacillin-tazobactam (ZOSYN)  IV Stopped (06/19/20 1249)  . potassium PHOSPHATE IVPB (in mmol)    . sodium chloride      Assessment: See full assessment earlier today by M.[place] PharmD. Briefly, 10 yoM started on Heparin IV on 4/8 for new onset Afib.  No bolus per MD d/t decreased Hg 11.6> 9.5 and PLT 163> 118.    Heparin level < 0.1, subtherapeutic on heparin at 1300 units/hr (no bolus) No bleeding or complications reported    Goal of Therapy:  Heparin level 0.3-0.7 units/ml Monitor platelets by anticoagulation protocol: Yes   Plan:  No heparin bolus d/t anemia/thrombocytopenia. Increase to heparin IV infusion at 1650 units/hr No level ordered d/t plan to STOP heparin on 4/8 at 0130 AM for tentative ERCP 4/9 @ 0730 Follow up plans to resume heparin post procedure and recheck level at that time.   Gretta Arab PharmD, BCPS Clinical Pharmacist [facility] main pharmacy [phone] 06/19/2020 3:50 PM

## 2020-06-19 NOTE — Progress Notes (Addendum)
ANTICOAGULATION CONSULT NOTE - Initial Consult  Pharmacy Consult for heparin Indication: Afib & concern for ACS with elevated troponin  No Known Allergies  Patient Measurements: Height: 5\' 11"  (180.3 cm) Weight: 86.5 kg (190 lb 11.2 oz) IBW/kg (Calculated) : 75.3 Heparin Dosing Weight: 86.5 kg  Vital Signs: Temp: 99.1 F (37.3 C) (04/08 0800) Temp Source: Oral (04/08 0800) BP: 97/41 (04/08 0645) Pulse Rate: 122 (04/08 0645)  Labs: Recent Labs    06/18/20 1353 06/18/20 1407 06/19/20 0431 06/19/20 0727  HGB 11.0* 11.6* 9.5*  --   HCT 33.7* 34.0* 29.5*  --   PLT 163  --  118*  --   CREATININE 2.04* 2.10* 1.65*  --   TROPONINIHS  --   --   --  1,024*    Estimated Creatinine Clearance: 43.7 mL/min (A) (by C-G formula based on SCr of 1.65 mg/dL (H)).   Medical History: Past Medical History:  Diagnosis Date  . Allergy   . COPD (chronic obstructive pulmonary disease) ([place])   . GERD (gastroesophageal reflux disease)   . History of pulmonary embolus (PE)   . Hypertension   . Insomnia     Medications:  Facility-Administered Medications Prior to Admission  Medication Dose Route Frequency Provider Last Rate Last Admin  . 0.9 %  sodium chloride infusion  500 mL Intravenous Continuous Armbruster, Carlota Raspberry, MD       Medications Prior to Admission  Medication Sig Dispense Refill Last Dose  . albuterol (VENTOLIN HFA) 108 (90 Base) MCG/ACT inhaler Inhale 2 puffs into the lungs every 4 (four) hours as needed for wheezing or shortness of breath.   06/17/2020 at Unknown time  . clonazePAM (KLONOPIN) 1 MG tablet Take 1 mg by mouth at bedtime as needed for anxiety.   Past Week at Unknown time  . cyanocobalamin 1000 MCG tablet Take 1,000 mcg by mouth daily.   06/16/2020  . DALIRESP 250 MCG TABS Take 250 mcg by mouth daily.   06/17/2020 at Unknown time  . donepezil (ARICEPT) 10 MG tablet Take 10 mg by mouth daily.   06/17/2020 at Unknown time  . famotidine (PEPCID) 20 MG tablet Take 20 mg  by mouth daily.   06/17/2020 at Unknown time  . fluticasone (FLONASE) 50 MCG/ACT nasal spray Place 1 spray into both nostrils daily as needed for allergies or rhinitis.   06/15/2020  . Fluticasone-Umeclidin-Vilant 100-62.5-25 MCG/INH AEPB Inhale 1 puff into the lungs daily.   06/17/2020 at Unknown time  . gabapentin (NEURONTIN) 300 MG capsule Take 300 mg by mouth 4 (four) times daily.   06/17/2020 at Unknown time  . HYDROcodone-acetaminophen (NORCO) 10-325 MG tablet Take 1 tablet by mouth every 6 (six) hours as needed for severe pain.   06/18/2020 at Unknown time  . Iron-Folic Acid-Vit O17 (IRON FORMULA PO) Take 81 mg by mouth 2 (two) times daily.   06/17/2020 at Unknown time  . mometasone (NASONEX) 50 MCG/ACT nasal spray Place 2 sprays into the nose daily.   06/15/2020  . nystatin cream (MYCOSTATIN) Apply 1 application topically 2 (two) times daily.   06/04/2020  . omeprazole (PRILOSEC) 40 MG capsule Take 1 capsule (40 mg total) by mouth daily. 90 capsule 3 06/17/2020 at Unknown time  . potassium chloride SA (KLOR-CON) 20 MEQ tablet Take 20 mEq by mouth daily.   Past Month at Unknown time  . tamsulosin (FLOMAX) 0.4 MG CAPS capsule Take 0.4 mg by mouth daily.   06/17/2020 at Unknown time  . triamcinolone  ointment (KENALOG) 0.1 % Apply 1 application topically daily as needed.   Past Month at Unknown time  . VITAMIN D, ERGOCALCIFEROL, PO Take 5,000 Units by mouth daily.   06/17/2020 at Unknown time    Assessment: 70 yo M admitted with > 2 week abdominal pain, presented with sepsis 2nd cholangitis.  Pharmacy consulted to dose heparin for new onset Afib & concern for ACS with elevated troponin. PMH PE.  Not on anticoagulants PTA.  Hg 11.6> 9.5 PLT 163> 118 SCr 2.1> 1.65 D/w Dr Ander Slade dropping Hg/PLT.  No signs of bleeding, start heparin drip with no bolus.   Goal of Therapy:  Heparin level 0.3-0.7 units/ml Monitor platelets by anticoagulation protocol: Yes   Plan:  No bolus per CCM d/t drop in Hg/PLTC Start  heparin drip at 1300 units/hr and check 8 hr heparin level Daily CBC, heparin level Per GI tentative ERCP 4/9 @ 0730, heparin to be stopped 4/8 @ 0130 am  Eudelia Bunch, Pharm.D 06/19/2020 9:07 AM

## 2020-06-19 NOTE — TOC Initial Note (Signed)
Transition of Care [facility]) - Initial/Assessment Note    Patient Details  Name: Warren [name] MRN: [id number] Date of Birth: 25-Apr-1950  Transition of Care [facility]) CM/SW Contact:    Leeroy Cha, RN Phone Number: 06/19/2020, 8:03 AM  Clinical Narrative:                 70 y.o. male is seen in consultation at the request of Xenia blunt, NP for recommendations on further evaluation and management of hypotension/shock.  The patient was admitted earlier in the evening (06/18/2020) with complaints of 2-1/2 weeks of abdominal pain.  He was diagnosed with cholangitis and was scheduled for ERCP later this morning.  Overnight, the patient became progressively hypotensive despite administration of up to 3 L IV fluids.  Orders were placed to start low-dose norepinephrine.  He is seen after returning from CT.  Of note, the patient has a remote history of cholecystectomy.  In the emergency department, CT angio chest/abdomen/pelvis was obtained on 06/18/2020 and showed an apparent 6 mm stone at the ampulla.  CBD dilatation 15 mm.  Newly dilated main pancreatic duct (5 mm).  He was given a single dose of cefepime/Flagyl/vancomycin in the emergency department.  He has subsequently been started on Zosyn.  He was admitted to the hospitalist service with a working diagnosis of sepsis secondary to cholangitis.  Gastroenterology has been consulted.   REVIEW OF SYSTEMS  Constitutional: No weight loss. No night sweats. No fever. No chills. No fatigue. HEENT: Headaches.  No dysphagia, sore throat, otalgia, nasal congestion, PND CV:  Chest pain.  Palpitations.  No orthopnea, paroxysmal nocturnal dyspnea, swelling in lower extremities. GI:  Abdominal pain.  No nausea, vomiting, diarrhea, change in bowel pattern, anorexia. Resp: No DOE, rest dyspnea, cough, mucus, hemoptysis, wheezing  GU: no dysuria, change in color of urine, no urgency or frequency.  No flank pain. MS:  L shoulder pain.  No joint pain or swelling.  No myalgias,  No decreased range of motion.  Psych:  No change in mood or affect. No memory loss. Skin: no rash or lesions. PLAN: to return to home and previous adl level. Lives alone but has family.  Expected Discharge Plan: Home/Self Care Barriers to Discharge: Continued Medical Work up   Patient Goals and CMS Choice Patient states their goals for this hospitalization and ongoing recovery are:: to go home CMS Medicare.gov Compare Post Acute Care list provided to:: Patient    Expected Discharge Plan and Services Expected Discharge Plan: Home/Self Care   Discharge Planning Services: CM Consult   Living arrangements for the past 2 months: Single Family Home                                      Prior Living Arrangements/Services Living arrangements for the past 2 months: Single Family Home Lives with:: Self Patient language and need for interpreter reviewed:: Yes Do you feel safe going back to the place where you live?: Yes      Need for Family Participation in Patient Care: Yes (Comment) Care giver support system in place?: Yes (comment)   Criminal Activity/Legal Involvement Pertinent to Current Situation/Hospitalization: No - Comment as needed  Activities of Daily Living Home Assistive Devices/Equipment: Cane (specify quad or straight) ADL Screening (condition at time of admission) Patient's cognitive ability adequate to safely complete daily activities?: Yes Is the patient deaf or have difficulty hearing?: Yes (hearing  aid at home) Does the patient have difficulty seeing, even when wearing glasses/contacts?: No Does the patient have difficulty concentrating, remembering, or making decisions?: Yes Patient able to express need for assistance with ADLs?: Yes Does the patient have difficulty dressing or bathing?: No Independently performs ADLs?: Yes (appropriate for developmental age) Does the patient have difficulty walking or climbing stairs?: No Weakness of Legs:  Both Weakness of Arms/Hands: None  Permission Sought/Granted                  Emotional Assessment Appearance:: Appears stated age Attitude/Demeanor/Rapport: Engaged Affect (typically observed): Calm Orientation: : Oriented to Place,Oriented to Self,Oriented to  Time,Oriented to Situation Alcohol / Substance Use: Not Applicable Psych Involvement: No (comment)  Admission diagnosis:  Choledocholithiasis with obstruction [K80.51] Calculus of bile duct without cholecystitis with obstruction [K80.51] Sepsis, due to unspecified organism, unspecified whether acute organ dysfunction present [facility]) [A41.9] Patient Active Problem List   Diagnosis Date Noted  . Atrial fibrillation with rapid ventricular response ([place]) 06/19/2020  . Septic shock ([place]) 06/19/2020  . Choledocholithiasis with obstruction 06/18/2020  . GERD (gastroesophageal reflux disease) 06/18/2020  . S/P cholecystectomy 06/18/2020  . History of pancreatitis 06/18/2020  . History of pulmonary embolism 06/18/2020  . COPD (chronic obstructive pulmonary disease) ([place]) 06/18/2020  . Cholangitis   . ANXIETY 03/22/2007  . TOBACCO ABUSE 03/22/2007  . COPD 03/22/2007  . GERD 03/22/2007  . TRANSAMINASES, SERUM, ELEVATED 03/22/2007  . PANCREATITIS, HX OF 03/22/2007   PCP:  Imagene Riches, NP Pharmacy:   [place], [address] [place] [address] Phone: [phone] Fax: [phone]     Social Determinants of Health (SDOH) Interventions    Readmission Risk Interventions No flowsheet data found.

## 2020-06-19 NOTE — Progress Notes (Signed)
  Amiodarone Drug - Drug Interaction Consult Note  Recommendations: No drug interactions noted at present time Amiodarone is metabolized by the cytochrome P450 system and therefore has the potential to cause many drug interactions. Amiodarone has an average plasma half-life of 50 days (range 20 to 100 days).   There is potential for drug interactions to occur several weeks or months after stopping treatment and the onset of drug interactions may be slow after initiating amiodarone.   []  Statins: Increased risk of myopathy. Simvastatin- restrict dose to 20mg  daily. Other statins: counsel patients to report any muscle pain or weakness immediately.  []  Anticoagulants: Amiodarone can increase anticoagulant effect. Consider warfarin dose reduction. Patients should be monitored closely and the dose of anticoagulant altered accordingly, remembering that amiodarone levels take several weeks to stabilize.  []  Antiepileptics: Amiodarone can increase plasma concentration of phenytoin, the dose should be reduced. Note that small changes in phenytoin dose can result in large changes in levels. Monitor patient and counsel on signs of toxicity.  []  Beta blockers: increased risk of bradycardia, AV block and myocardial depression. Sotalol - avoid concomitant use.  []   Calcium channel blockers (diltiazem and verapamil): increased risk of bradycardia, AV block and myocardial depression.  []   Cyclosporine: Amiodarone increases levels of cyclosporine. Reduced dose of cyclosporine is recommended.  []  Digoxin dose should be halved when amiodarone is started.  []  Diuretics: increased risk of cardiotoxicity if hypokalemia occurs.  []  Oral hypoglycemic agents (glyburide, glipizide, glimepiride): increased risk of hypoglycemia. Patient's glucose levels should be monitored closely when initiating amiodarone therapy.   []  Drugs that prolong the QT interval:  Torsades de pointes risk may be increased with concurrent  use - avoid if possible.  Monitor QTc, also keep magnesium/potassium WNL if concurrent therapy can't be avoided. Marland Kitchen Antibiotics: e.g. fluoroquinolones, erythromycin. . Antiarrhythmics: e.g. quinidine, procainamide, disopyramide, sotalol. . Antipsychotics: e.g. phenothiazines, haloperidol.  . Lithium, tricyclic antidepressants, and methadone. Thank You,   Eudelia Bunch, Pharm.D 06/19/2020 8:35 AM

## 2020-06-19 NOTE — Progress Notes (Signed)
CCMD called to alert nurse to afib on cardiac monitor. Vital signs showed hypotension, elevated temp, and tachycardia. Pt slower to respond than previous assessment. Notified on call provider, new orders placed in epic. Charge nurse notified and came to complete EKG. Rapid response called. Pt transferred to [facility] ICU

## 2020-06-19 NOTE — Progress Notes (Signed)
  Echocardiogram 2D Echocardiogram has been performed.  Fidel Levy 06/19/2020, 3:49 PM

## 2020-06-19 NOTE — Progress Notes (Addendum)
Attempted to contact son regarding change in patient's condition and transfer to [facility] ICU, but unsuccessful. Unsure that phone number in chart is correct.

## 2020-06-20 ENCOUNTER — Inpatient Hospital Stay (HOSPITAL_COMMUNITY): Payer: Medicare Other | Admitting: Registered Nurse

## 2020-06-20 ENCOUNTER — Encounter (HOSPITAL_COMMUNITY)
Admission: EM | Disposition: A | Discharge status: Home Health | Payer: Self-pay | Source: Home / Self Care | Attending: Pulmonary Disease

## 2020-06-20 ENCOUNTER — Encounter (HOSPITAL_COMMUNITY): Payer: Self-pay | Admitting: Family Medicine

## 2020-06-20 ENCOUNTER — Inpatient Hospital Stay (HOSPITAL_COMMUNITY): Payer: Medicare Other

## 2020-06-20 DIAGNOSIS — K8051 Calculus of bile duct without cholangitis or cholecystitis with obstruction: Secondary | ICD-10-CM | POA: Diagnosis not present

## 2020-06-20 DIAGNOSIS — I4891 Unspecified atrial fibrillation: Secondary | ICD-10-CM | POA: Diagnosis not present

## 2020-06-20 DIAGNOSIS — A419 Sepsis, unspecified organism: Secondary | ICD-10-CM | POA: Diagnosis not present

## 2020-06-20 DIAGNOSIS — R6521 Severe sepsis with septic shock: Secondary | ICD-10-CM | POA: Diagnosis not present

## 2020-06-20 HISTORY — PX: SPHINCTEROTOMY: SHX5544

## 2020-06-20 HISTORY — PX: ERCP: SHX5425

## 2020-06-20 LAB — GLUCOSE, CAPILLARY
Glucose-Capillary: 129 mg/dL — ABNORMAL HIGH (ref 70–99)
Glucose-Capillary: 132 mg/dL — ABNORMAL HIGH (ref 70–99)
Glucose-Capillary: 138 mg/dL — ABNORMAL HIGH (ref 70–99)
Glucose-Capillary: 159 mg/dL — ABNORMAL HIGH (ref 70–99)
Glucose-Capillary: 165 mg/dL — ABNORMAL HIGH (ref 70–99)

## 2020-06-20 LAB — CBC
HCT: 29.6 % — ABNORMAL LOW (ref 39.0–52.0)
Hemoglobin: 9.6 g/dL — ABNORMAL LOW (ref 13.0–17.0)
MCH: 32.9 pg (ref 26.0–34.0)
MCHC: 32.4 g/dL (ref 30.0–36.0)
MCV: 101.4 fL — ABNORMAL HIGH (ref 80.0–100.0)
Platelets: 93 10*3/uL — ABNORMAL LOW (ref 150–400)
RBC: 2.92 MIL/uL — ABNORMAL LOW (ref 4.22–5.81)
RDW: 15.3 % (ref 11.5–15.5)
WBC: 11 10*3/uL — ABNORMAL HIGH (ref 4.0–10.5)
nRBC: 0 % (ref 0.0–0.2)

## 2020-06-20 LAB — HEPATIC FUNCTION PANEL
ALT: 43 U/L (ref 0–44)
AST: 79 U/L — ABNORMAL HIGH (ref 15–41)
Albumin: 2.1 g/dL — ABNORMAL LOW (ref 3.5–5.0)
Alkaline Phosphatase: 139 U/L — ABNORMAL HIGH (ref 38–126)
Bilirubin, Direct: 1.5 mg/dL — ABNORMAL HIGH (ref 0.0–0.2)
Indirect Bilirubin: 0.9 mg/dL (ref 0.3–0.9)
Total Bilirubin: 2.4 mg/dL — ABNORMAL HIGH (ref 0.3–1.2)
Total Protein: 5.1 g/dL — ABNORMAL LOW (ref 6.5–8.1)

## 2020-06-20 LAB — BASIC METABOLIC PANEL
Anion gap: 11 (ref 5–15)
BUN: 33 mg/dL — ABNORMAL HIGH (ref 8–23)
CO2: 22 mmol/L (ref 22–32)
Calcium: 8 mg/dL — ABNORMAL LOW (ref 8.9–10.3)
Chloride: 95 mmol/L — ABNORMAL LOW (ref 98–111)
Creatinine, Ser: 1.76 mg/dL — ABNORMAL HIGH (ref 0.61–1.24)
GFR, Estimated: 41 mL/min — ABNORMAL LOW (ref >60)
Glucose, Bld: 137 mg/dL — ABNORMAL HIGH (ref 70–99)
Potassium: 3.8 mmol/L (ref 3.5–5.1)
Sodium: 128 mmol/L — ABNORMAL LOW (ref 135–145)

## 2020-06-20 LAB — T3, FREE: T3, Free: 1.5 pg/mL — ABNORMAL LOW (ref 2.0–4.4)

## 2020-06-20 SURGERY — ERCP, WITH INTERVENTION IF INDICATED
Anesthesia: General

## 2020-06-20 MED ORDER — PROPOFOL 10 MG/ML IV BOLUS
INTRAVENOUS | Status: DC | PRN
  Administered 2020-06-20: 60 mg via INTRAVENOUS

## 2020-06-20 MED ORDER — LACTATED RINGERS IV SOLN
INTRAVENOUS | Status: AC | PRN
  Administered 2020-06-20: 20 mL/h via INTRAVENOUS

## 2020-06-20 MED ORDER — INDOMETHACIN 50 MG RE SUPP
RECTAL | Status: DC | PRN
  Administered 2020-06-20: 100 mg via RECTAL

## 2020-06-20 MED ORDER — SUGAMMADEX SODIUM 500 MG/5ML IV SOLN
INTRAVENOUS | Status: DC | PRN
  Administered 2020-06-20: 300 mg via INTRAVENOUS

## 2020-06-20 MED ORDER — SODIUM CHLORIDE 0.9 % IV SOLN: INTRAVENOUS | Status: DC

## 2020-06-20 MED ORDER — INDOMETHACIN 50 MG RE SUPP: 100.0000 mg | Freq: Once | RECTAL | Status: DC

## 2020-06-20 MED ORDER — FENTANYL CITRATE (PF) 100 MCG/2ML IJ SOLN
INTRAMUSCULAR | Status: DC | PRN
  Administered 2020-06-20: 100 ug via INTRAVENOUS

## 2020-06-20 MED ORDER — ACETAMINOPHEN 160 MG/5ML PO SOLN
1000.0000 mg | Freq: Once | ORAL | Status: AC | PRN
  Filled 2020-06-20: qty 40.6

## 2020-06-20 MED ORDER — GLUCAGON HCL RDNA (DIAGNOSTIC) 1 MG IJ SOLR
INTRAMUSCULAR | Status: AC
  Filled 2020-06-20: qty 1

## 2020-06-20 MED ORDER — OXYCODONE HCL 5 MG/5ML PO SOLN
5.0000 mg | Freq: Once | ORAL | Status: AC | PRN
  Filled 2020-06-20: qty 5

## 2020-06-20 MED ORDER — MIDAZOLAM HCL 2 MG/2ML IJ SOLN
INTRAMUSCULAR | Status: AC
  Filled 2020-06-20: qty 2

## 2020-06-20 MED ORDER — MIDAZOLAM HCL 5 MG/5ML IJ SOLN
INTRAMUSCULAR | Status: DC | PRN
  Administered 2020-06-20: .5 mg via INTRAVENOUS

## 2020-06-20 MED ORDER — PROPOFOL 1000 MG/100ML IV EMUL
INTRAVENOUS | Status: AC
  Filled 2020-06-20: qty 100

## 2020-06-20 MED ORDER — ACETAMINOPHEN 10 MG/ML IV SOLN
1000.0000 mg | Freq: Once | INTRAVENOUS | Status: AC | PRN
  Filled 2020-06-20: qty 100

## 2020-06-20 MED ORDER — LIDOCAINE 2% (20 MG/ML) 5 ML SYRINGE
INTRAMUSCULAR | Status: DC | PRN
  Administered 2020-06-20: 60 mg via INTRAVENOUS

## 2020-06-20 MED ORDER — PROPOFOL 10 MG/ML IV BOLUS
INTRAVENOUS | Status: AC
  Filled 2020-06-20: qty 20

## 2020-06-20 MED ORDER — ACETAMINOPHEN 500 MG PO TABS
1000.0000 mg | ORAL_TABLET | Freq: Once | ORAL | Status: AC | PRN
  Administered 2020-06-21: 1000 mg via ORAL
  Filled 2020-06-20 (×2): qty 2

## 2020-06-20 MED ORDER — SODIUM CHLORIDE 0.9 % IV SOLN
2.0000 g | INTRAVENOUS | Status: DC
  Administered 2020-06-20 – 2020-06-25 (×6): 2 g via INTRAVENOUS
  Filled 2020-06-20: qty 2
  Filled 2020-06-20 (×3): qty 20
  Filled 2020-06-20 (×2): qty 2
  Filled 2020-06-20 (×3): qty 20

## 2020-06-20 MED ORDER — OXYCODONE HCL 5 MG PO TABS
5.0000 mg | ORAL_TABLET | Freq: Once | ORAL | Status: AC | PRN
  Administered 2020-06-20: 5 mg via ORAL
  Filled 2020-06-20 (×2): qty 1

## 2020-06-20 MED ORDER — FENTANYL CITRATE (PF) 100 MCG/2ML IJ SOLN: 25.0000 ug | INTRAMUSCULAR | Status: DC | PRN

## 2020-06-20 MED ORDER — ROCURONIUM BROMIDE 10 MG/ML (PF) SYRINGE
PREFILLED_SYRINGE | INTRAVENOUS | Status: DC | PRN
  Administered 2020-06-20: 70 mg via INTRAVENOUS

## 2020-06-20 MED ORDER — FENTANYL CITRATE (PF) 250 MCG/5ML IJ SOLN
INTRAMUSCULAR | Status: AC
  Filled 2020-06-20: qty 5

## 2020-06-20 MED ORDER — INDOMETHACIN 50 MG RE SUPP
RECTAL | Status: AC
  Filled 2020-06-20: qty 2

## 2020-06-20 MED ORDER — ONDANSETRON HCL 4 MG/2ML IJ SOLN
INTRAMUSCULAR | Status: DC | PRN
  Administered 2020-06-20: 4 mg via INTRAVENOUS

## 2020-06-20 MED ORDER — SODIUM CHLORIDE 0.9 % IV SOLN
INTRAVENOUS | Status: DC | PRN
  Administered 2020-06-20: 20 mL

## 2020-06-20 MED ORDER — HEPARIN (PORCINE) 25000 UT/250ML-% IV SOLN
1850.0000 [IU]/h | INTRAVENOUS | Status: AC
  Administered 2020-06-20: 1650 [IU]/h via INTRAVENOUS
  Administered 2020-06-21: 1850 [IU]/h via INTRAVENOUS
  Filled 2020-06-20 (×2): qty 250

## 2020-06-20 MED ORDER — ALBUMIN HUMAN 5 % IV SOLN: INTRAVENOUS | Status: DC | PRN

## 2020-06-20 MED ORDER — ALBUMIN HUMAN 5 % IV SOLN
INTRAVENOUS | Status: AC
  Filled 2020-06-20: qty 250

## 2020-06-20 NOTE — Anesthesia Procedure Notes (Signed)
Procedure Name: Intubation Date/Time: 06/20/2020 8:09 AM Performed by: Oleta Mouse, MD Pre-anesthesia Checklist: Patient identified, Emergency Drugs available, Suction available and Patient being monitored Patient Re-evaluated:Patient Re-evaluated prior to induction Oxygen Delivery Method: Circle system utilized Preoxygenation: Pre-oxygenation with 100% oxygen Induction Type: IV induction Ventilation: Mask ventilation without difficulty Laryngoscope Size: Mac and 4 Grade View: Grade I Tube type: Oral Tube size: 8.0 mm Number of attempts: 1 Airway Equipment and Method: Stylet and Oral airway Placement Confirmation: ETT inserted through vocal cords under direct vision,  positive ETCO2 and breath sounds checked- equal and bilateral Tube secured with: Tape Dental Injury: Teeth and Oropharynx as per pre-operative assessment

## 2020-06-20 NOTE — Transfer of Care (Signed)
Immediate Anesthesia Transfer of Care Note  Patient: Warren [name]  Procedure(s) Performed: ENDOSCOPIC RETROGRADE CHOLANGIOPANCREATOGRAPHY (ERCP) (N/A ) SPHINCTEROTOMY  Patient Location: PACU  Anesthesia Type:General  Level of Consciousness: awake, alert , oriented and patient cooperative  Airway & Oxygen Therapy: Patient Spontanous Breathing and Patient connected to face mask oxygen  Post-op Assessment: Report given to RN, Post -op Vital signs reviewed and stable and Patient moving all extremities X 4  Post vital signs: stable  Last Vitals:  Vitals Value Taken Time  BP 98/64 06/20/20 0915  Temp 36.3 C 06/20/20 0915  Pulse 75 06/20/20 0924  Resp 13 06/20/20 0924  SpO2 99 % 06/20/20 0924  Vitals shown include unvalidated device data.  Last Pain:  Vitals:   06/20/20 0915  TempSrc:   PainSc: 0-No pain      Patients Stated Pain Goal: 3 (16/10/96 0454)  Complications: No complications documented.

## 2020-06-20 NOTE — Anesthesia Preprocedure Evaluation (Addendum)
Anesthesia Evaluation  Patient identified by MRN, date of birth, ID band Patient awake    Reviewed: Allergy & Precautions, NPO status , Patient's Chart, lab work & pertinent test results  History of Anesthesia Complications Negative for: history of anesthetic complications  Airway Mallampati: I  TM Distance: >3 FB Neck ROM: Full    Dental  (+) Edentulous Upper, Edentulous Lower   Pulmonary sleep apnea , COPD, former smoker, PE Covid-19 Nucleic Acid Test Results Lab Results      Component                Value               Date                      [place]              NEGATIVE            06/18/2020              + rhonchi        Cardiovascular hypertension, Pt. on medications (-) angina+ dysrhythmias Atrial Fibrillation + Valvular Problems/Murmurs AS  Rhythm:Regular Rate:Bradycardia  1. Left ventricular ejection fraction, by estimation, is 50 to 55%. The  left ventricle has low normal function. The left ventricle has no regional  wall motion abnormalities. There is mild left ventricular hypertrophy.  Left ventricular diastolic  parameters are indeterminate.  2. Right ventricular systolic function is normal. The right ventricular  size is normal. There is mildly elevated pulmonary artery systolic  pressure. The estimated right ventricular systolic pressure is 13.2 mmHg.  3. Left atrial size was mildly dilated.  4. Right atrial size was mildly dilated.  5. The mitral valve is normal in structure. Mild mitral valve  regurgitation.  6. The aortic valve is tricuspid. There is moderate calcification of the  aortic valve. Aortic valve regurgitation is not visualized. Mild to  moderate aortic valve stenosis. Vmax 2.6 m/s, MG 19 mmHg, AVA 1.5 cm^2, DI  0.47  7. The inferior vena cava is dilated in size with >50% respiratory  variability, suggesting right atrial pressure of 8 mmHg.    Neuro/Psych PSYCHIATRIC DISORDERS  Anxiety negative neurological ROS     GI/Hepatic GERD  Medicated and Controlled,CBD stone with sepsis, hypotension on levophed d/c by icu this morning   Endo/Other  No results found for: HGBA1C   Renal/GU ARFRenal diseaseLab Results      Component                Value               Date                      CREATININE               1.76 (H)            06/20/2020                Musculoskeletal negative musculoskeletal ROS (+)   Abdominal   Peds  Hematology  (+) Blood dyscrasia, anemia , Lab Results      Component                Value               Date  WBC                      11.0 (H)            06/20/2020                HGB                      9.6 (L)             06/20/2020                HCT                      29.6 (L)            06/20/2020                MCV                      101.4 (H)           06/20/2020                PLT                      93 (L)              06/20/2020              Anesthesia Other Findings   Reproductive/Obstetrics                            Anesthesia Physical Anesthesia Plan  ASA: III  Anesthesia Plan: General   Post-op Pain Management:    Induction: Intravenous, Rapid sequence and Cricoid pressure planned  PONV Risk Score and Plan: Ondansetron and Treatment may vary due to age or medical condition  Airway Management Planned: Oral ETT  Additional Equipment: None  Intra-op Plan:   Post-operative Plan: Extubation in OR  Informed Consent: I have reviewed the patients History and Physical, chart, labs and discussed the procedure including the risks, benefits and alternatives for the proposed anesthesia with the patient or authorized representative who has indicated his/her understanding and acceptance.     Dental advisory given  Plan Discussed with: CRNA  Anesthesia Plan Comments:         Anesthesia Quick Evaluation

## 2020-06-20 NOTE — Op Note (Addendum)
[name] Patient Name: Warren [name] Procedure Date: 06/20/2020 MRN: [id number] Attending MD: Milus Banister , MD Date of Birth: January 02, 1951 CSN: [id number] Age: 70 Admit Type: Inpatient Procedure:                ERCP Indications:              remote cholecystectomy, now with pain, elevated                            LFTs, elevated WBC; two CTs in past 3-4 days have                            showed CBD vs. ampullary stone, dilated biliary tree Providers:                Milus Banister, MD, Grace Isaac, RN, William Dalton, Technician Referring MD:              Medicines:                General Anesthesia, Indomethacin 100 mg PR, zosyn                            IV in hospital Complications:            No immediate complications. Estimated blood loss:                            None Estimated Blood Loss:     Estimated blood loss: none. Procedure:                Pre-Anesthesia Assessment:                           - Prior to the procedure, a History and Physical                            was performed, and patient medications and                            allergies were reviewed. The patient's tolerance of                            previous anesthesia was also reviewed. The risks                            and benefits of the procedure and the sedation                            options and risks were discussed with the patient.                            All questions were answered, and informed consent  was obtained. Prior Anticoagulants: The patient has                            taken heparin, last dose was day of procedure. ASA                            Grade Assessment: IV - A patient with severe                            systemic disease that is a constant threat to life.                            After reviewing the risks and benefits, the patient                            was deemed in satisfactory  condition to undergo the                            procedure.                           After obtaining informed consent, the scope was                            passed under direct vision. Throughout the                            procedure, the patient's blood pressure, pulse, and                            oxygen saturations were monitored continuously. The                            ERCP [id number] was introduced through the mouth, and                            used to inject contrast into and used to inject                            contrast into the bile duct. The ERCP was                            accomplished without difficulty. The patient                            tolerated the procedure well. Scope In: Scope Out: Findings:      Scout film showed clips in the right upper quadrant. A duodenoscope was       advanced to the region of the major papilla without detailed examination       of the upper GI tract. There was a medium sized periampullary       diverticulum which only slightly interfered with biliary cannulation. I       used a 44 autotome over a 0.035 Hydra  wire to cannulate the bile duct.       An identical wire was temporarily placed in the main pancreatic duct to       facilitate biliary cannulation. Contrast was injected into the bile       duct. Cholangiogram revealed very minimally dilated extrahepatic bile       duct at 6 or 7 mm. This tapered smoothly into the region of the major       papilla. There was no convincing filling defect in the bile duct however       given 2 recent CT scans showing CBD stone I elected to perform a limited       biliary sphincterotomy over the bile duct wire and then I swept the duct       several times with a biliary retrieval balloon held inflated to 9 mm. No       stones, sludge or purulence was delivered into the duodenum. The       pancreatic duct was cannulated a single time with the wire but never       injected with  contrast. Impression:               - Periampullary duodenal diveriticulum.                           - Very minimally dilated bile duct without stones.                            Given the recent (two) CT scans which showed                            CBD/ampullary stone I performed a limited biliary                            sphincterotomy and swept the duct several times. No                            stones were noted.                           - I suspect the stone seen on recent CT scan passed                            prior to this examination. Moderate Sedation:      Not Applicable - Patient had care per Anesthesia. Recommendation:           - Return patient to hospital ward for ongoing care.                           - Continue Iv antibiotics another 24 hours, then                            should complete three more days of oral antibiotics.                           - Trend LFTs.                           -  I will order heart healthy diet.                           - OK to resume heparin at 7pm.                           - I called his son to update him but there was no                            answer. Procedure Code(s):        --- Professional ---                           [phone], Endoscopic retrograde                            cholangiopancreatography (ERCP); with                            sphincterotomy/papillotomy Diagnosis Code(s):        --- Professional ---                           K80.50, Calculus of bile duct without cholangitis                            or cholecystitis without obstruction CPT copyright 2019 American Medical Association. All rights reserved. The codes documented in this report are preliminary and upon coder review may  be revised to meet current compliance requirements. Milus Banister, MD 06/20/2020 9:01:06 AM This report has been signed electronically. Number of Addenda: 0

## 2020-06-20 NOTE — Progress Notes (Signed)
NAME:  Warren [name] MRN:  [id number] DOB:  [birth year] LOS: 2 ADMISSION DATE:  06/18/2020  CONSULTATION DATE:  06/19/2020 REFERRING MD:  Lovey Newcomer, NP  REASON FOR CONSULTATION:  Hypotension/septic shock     Brief History   N/A  History of present illness   This 70 y.o. male is seen in consultation at the request of Xenia blunt, NP for recommendations on further evaluation and management of hypotension/shock.  The patient was admitted earlier in the evening (06/18/2020) with complaints of 2-1/2 weeks of abdominal pain.  He was diagnosed with cholangitis and was scheduled for ERCP later this morning.  Overnight, the patient became progressively hypotensive despite administration of up to 3 L IV fluids.  Orders were placed to start low-dose norepinephrine.  He is seen after returning from CT.  Of note, the patient has a remote history of cholecystectomy.  In the emergency department, CT angio chest/abdomen/pelvis was obtained on 06/18/2020 and showed an apparent 6 mm stone at the ampulla.  CBD dilatation 15 mm.  Newly dilated main pancreatic duct (5 mm).  He was given a single dose of cefepime/Flagyl/vancomycin in the emergency department.  He has subsequently been started on Zosyn.  He was admitted to the hospitalist service with a working diagnosis of sepsis secondary to cholangitis.  Gastroenterology has been consulted.   Past Medical/Surgical/Social/Family History   Past Medical History:  Diagnosis Date  . Allergy   . COPD (chronic obstructive pulmonary disease) ([place])   . GERD (gastroesophageal reflux disease)   . History of pulmonary embolus (PE)   . Hypertension   . Insomnia   . OSA (obstructive sleep apnea)     Past Surgical History:  Procedure Laterality Date  . CHOLECYSTECTOMY      Social History   Tobacco Use  . Smoking status: Former Smoker    Quit date: 03/15/2015    Years since quitting: 5.2  . Smokeless tobacco: Not on file  Substance Use Topics  . Alcohol use: Not  Currently    Family History  Problem Relation Age of Onset  . Colon cancer Neg Hx      Significant Hospital Events   4/8 hypotensive admitted to PCCM, choledocholithiasis 4/9 ERCP, passed stone, swept, weaning pressors   Consults:  GI PCCM   Procedures:  ERCP 4/9   Significant Diagnostic Tests:  CTA chest/abdomen/pelvis (4/7): 6 mm stone at the ampulla; CBD dilatation, 15 mm; main pancreatic duct dilatation, 5 mm.   Micro Data:   Results for orders placed or performed during the hospital encounter of 06/18/20  Culture, blood (routine x 2)     Status: None (Preliminary result)   Collection Time: 06/18/20  4:24 PM   Specimen: BLOOD  Result Value Ref Range Status   Specimen Description   Final    BLOOD RIGHT ANTECUBITAL Performed at [facility] [address] [place], [place] [address]    Special Requests   Final    BOTTLES DRAWN AEROBIC AND ANAEROBIC Blood Culture adequate volume Performed at [facility] [address], [place], [place] [address]    Culture  Setup Time   Final    GRAM NEGATIVE RODS IN BOTH AEROBIC AND ANAEROBIC BOTTLES CRITICAL VALUE NOTED.  VALUE IS CONSISTENT WITH PREVIOUSLY REPORTED AND CALLED VALUE. Performed at [facility], [place] [address], [place], [place] [address]    Culture GRAM NEGATIVE RODS  Final   Report Status PENDING  Incomplete  Culture, blood (routine  x 2)     Status: Abnormal (Preliminary result)   Collection Time: 06/18/20  4:34 PM   Specimen: BLOOD LEFT FOREARM  Result Value Ref Range Status   Specimen Description   Final    BLOOD LEFT FOREARM Performed at [facility] [address] [place], [place] [address]    Special Requests   Final    BOTTLES DRAWN AEROBIC AND ANAEROBIC Blood Culture adequate volume Performed at [facility] [address], [place], [place] [address]    Culture  Setup Time   Final    GRAM NEGATIVE RODS IN  BOTH AEROBIC AND ANAEROBIC BOTTLES CRITICAL RESULT CALLED TO, READ BACK BY AND VERIFIED WITH: PHARMD D.WOFFORD AT 1051 ON 04/21/20 BY KJ Performed at [facility], [place] [address] [place], [place] [address]    Culture KLEBSIELLA OXYTOCA (A)  Final   Report Status PENDING  Incomplete  Blood Culture ID Panel (Reflexed)     Status: Abnormal   Collection Time: 06/18/20  4:34 PM  Result Value Ref Range Status   Enterococcus faecalis NOT DETECTED NOT DETECTED Final   Enterococcus Faecium NOT DETECTED NOT DETECTED Final   Listeria monocytogenes NOT DETECTED NOT DETECTED Final   Staphylococcus species NOT DETECTED NOT DETECTED Final   Staphylococcus aureus (BCID) NOT DETECTED NOT DETECTED Final   Staphylococcus epidermidis NOT DETECTED NOT DETECTED Final   Staphylococcus lugdunensis NOT DETECTED NOT DETECTED Final   Streptococcus species NOT DETECTED NOT DETECTED Final   Streptococcus agalactiae NOT DETECTED NOT DETECTED Final   Streptococcus pneumoniae NOT DETECTED NOT DETECTED Final   Streptococcus pyogenes NOT DETECTED NOT DETECTED Final   A.calcoaceticus-baumannii NOT DETECTED NOT DETECTED Final   Bacteroides fragilis NOT DETECTED NOT DETECTED Final   Enterobacterales DETECTED (A) NOT DETECTED Final    Comment: Enterobacterales represent a large order of gram negative bacteria, not a single organism. CRITICAL RESULT CALLED TO, READ BACK BY AND VERIFIED WITH: PHARMD D.WOFFORD ON 06/19/20 AT 1051 BY KJ    Enterobacter cloacae complex NOT DETECTED NOT DETECTED Final   Escherichia coli NOT DETECTED NOT DETECTED Final   Klebsiella aerogenes NOT DETECTED NOT DETECTED Final   Klebsiella oxytoca DETECTED (A) NOT DETECTED Final    Comment: CRITICAL RESULT CALLED TO, READ BACK BY AND VERIFIED WITH: PHARMD D.WOFFORD AT 1051 ON 04/21/20 [place] KJ    Klebsiella pneumoniae NOT DETECTED NOT DETECTED Final   Proteus species NOT DETECTED NOT DETECTED Final   Salmonella species NOT DETECTED NOT  DETECTED Final   Serratia marcescens NOT DETECTED NOT DETECTED Final   Haemophilus influenzae NOT DETECTED NOT DETECTED Final   Neisseria meningitidis NOT DETECTED NOT DETECTED Final   Pseudomonas aeruginosa NOT DETECTED NOT DETECTED Final   Stenotrophomonas maltophilia NOT DETECTED NOT DETECTED Final   Candida albicans NOT DETECTED NOT DETECTED Final   Candida auris NOT DETECTED NOT DETECTED Final   Candida glabrata NOT DETECTED NOT DETECTED Final   Candida krusei NOT DETECTED NOT DETECTED Final   Candida parapsilosis NOT DETECTED NOT DETECTED Final   Candida tropicalis NOT DETECTED NOT DETECTED Final   Cryptococcus neoformans/gattii NOT DETECTED NOT DETECTED Final   CTX-M ESBL NOT DETECTED NOT DETECTED Final   Carbapenem resistance IMP NOT DETECTED NOT DETECTED Final   Carbapenem resistance KPC NOT DETECTED NOT DETECTED Final   Carbapenem resistance NDM NOT DETECTED NOT DETECTED Final   Carbapenem resist OXA 48 LIKE NOT DETECTED NOT DETECTED Final   Carbapenem resistance VIM NOT DETECTED  NOT DETECTED Final    Comment: Performed at [facility], [place] [address] [place], [place] [address]  Resp Panel by RT-PCR (Flu A&B, Covid) Nasopharyngeal Swab     Status: None   Collection Time: 06/18/20  5:55 PM   Specimen: Nasopharyngeal Swab; Nasopharyngeal(NP) swabs in vial transport medium  Result Value Ref Range Status   SARS Coronavirus 2 by RT PCR NEGATIVE NEGATIVE Final    Comment: (NOTE) SARS-CoV-2 target nucleic acids are NOT DETECTED.  The SARS-CoV-2 RNA is generally detectable in upper respiratory specimens during the acute phase of infection. The lowest concentration of SARS-CoV-2 viral copies this assay can detect is 138 copies/mL. A negative result does not preclude SARS-Cov-2 infection and should not be used as the sole basis for treatment or other patient management decisions. A negative result may occur with  improper specimen collection/handling, submission of specimen  other than nasopharyngeal swab, presence of viral mutation(s) within the areas targeted by this assay, and inadequate number of viral copies(<138 copies/mL). A negative result must be combined with clinical observations, patient history, and epidemiological information. The expected result is Negative.  Fact Sheet for Patients:  EntrepreneurPulse.com.au  Fact Sheet for Healthcare Providers:  IncredibleEmployment.be  This test is no t yet approved or cleared by the [place] FDA and  has been authorized for detection and/or diagnosis of SARS-CoV-2 by FDA under an Emergency Use Authorization (EUA). This EUA will remain  in effect (meaning this test can be used) for the duration of the COVID-19 declaration under Section 564(b)(1) of the Act, 21 U.S.C.section 360bbb-3(b)(1), unless the authorization is terminated  or revoked sooner.       Influenza A by PCR NEGATIVE NEGATIVE Final   Influenza B by PCR NEGATIVE NEGATIVE Final    Comment: (NOTE) The Xpert Xpress SARS-CoV-2/FLU/RSV plus assay is intended as an aid in the diagnosis of influenza from Nasopharyngeal swab specimens and should not be used as a sole basis for treatment. Nasal washings and aspirates are unacceptable for Xpert Xpress SARS-CoV-2/FLU/RSV testing.  Fact Sheet for Patients: EntrepreneurPulse.com.au  Fact Sheet for Healthcare Providers: IncredibleEmployment.be  This test is not yet approved or cleared by the [place] FDA and has been authorized for detection and/or diagnosis of SARS-CoV-2 by FDA under an Emergency Use Authorization (EUA). This EUA will remain in effect (meaning this test can be used) for the duration of the COVID-19 declaration under Section 564(b)(1) of the Act, 21 U.S.C. section 360bbb-3(b)(1), unless the authorization is terminated or revoked.  Performed at [facility], [place] [address], [place], [place] [address]   MRSA PCR Screening     Status: None   Collection Time: 06/19/20  4:54 AM   Specimen: Nasopharyngeal  Result Value Ref Range Status   MRSA by PCR NEGATIVE NEGATIVE Final    Comment:        The GeneXpert MRSA Assay (FDA approved for NASAL specimens only), is one component of a comprehensive MRSA colonization surveillance program. It is not intended to diagnose MRSA infection nor to guide or monitor treatment for MRSA infections. Performed at [facility], [place] [address], [place], [place] [address]       Antimicrobials:  Cefepime/Flagyl/vancomycin (4/7) Zosyn (4/8>>)  Interim history/subjective:  NAEON, minimal pressors, ERCP today with passed stone, tolerated well   Objective   BP 123/82 (BP Location: Left Arm)   Pulse 73   Temp (!) 97.3 F (36.3 C)   Resp 15   Ht  5\' 11"  (1.803 m)   Wt 86.9 kg   SpO2 97%   BMI 26.72 kg/m     Filed Weights   06/18/20 1305 06/18/20 2030 20-Jul-2020 0500  Weight: 86.2 kg 86.5 kg 86.9 kg    Intake/Output Summary (Last 24 hours) at 07/20/20 1055 Last data filed at Jul 20, 2020 1028 Gross per 24 hour  Intake 2588.46 ml  Output 375 ml  Net 2213.46 ml        Examination: [id number] , in NAD Eyes: EOMI, mild icterus CV: tachy, irregular Pulm: NWOB, on RA Abd: soft, NT   Resolved Hospital Problem list      Assessment & Plan:  Septic Shock due to choledocholithiasis and Klebsiella bacteremia: S/p ERCP 2022-07-21, stone had passed, dilatation better --Zosyn (consider orals 4/10 pending susceptibilities) --Continue NE, wean as able, MAP > 65  Afib: in setting of septic shock, beta agonist (NE) --amio drip --Transition to oral if taking PO later --Consider BB once weaned off pressors  Hyperbilirubinemia: improving in setting of resolved choledocholithiasis  AKI on CKD 3b: Cr > 2 now down and stable 1.6-1.7 in setting of septic shock.  Best practice:  Diet:clears, advance as  tolerated Pain/Anxiety/Delirium protocol (if indicated): N/A VAP protocol (if indicated): N/A DVT prophylaxis: heparin GI prophylaxis: Protonix Glucose control: N/A Mobility/Activity: OOB to chair CODE STATUS: FULL CODE Family Communication:  n/a Disposition: ICU   Labs and Studies  Reviewed and as per EMR  CRITICAL CARE Performed by: Bonna Gains Shirell Struthers   Total critical care time: 35 minutes  Critical care time was exclusive of separately billable procedures and treating other patients.  Critical care was necessary to treat or prevent imminent or life-threatening deterioration.  Critical care was time spent personally by me on the following activities: development of treatment plan with patient and/or surrogate as well as nursing, discussions with consultants, evaluation of patient's response to treatment, examination of patient, obtaining history from patient or surrogate, ordering and performing treatments and interventions, ordering and review of laboratory studies, ordering and review of radiographic studies, pulse oximetry and re-evaluation of patient's condition.

## 2020-06-20 NOTE — Progress Notes (Signed)
ANTICOAGULATION CONSULT NOTE - Brief note  Pharmacy Consult for Heparin Indication: Afib  No Known Allergies  Patient Measurements: Height: 5\' 11"  (180.3 cm) Weight: 86.9 kg (191 lb 9.3 oz) IBW/kg (Calculated) : 75.3 Heparin Dosing Weight: TBW  Assessment: 69 yoM started on Heparin IV on 4/8 for new onset Afib & concern for ACS with elevated troponin.  No bolus per MD d/t decreased Hg 11.6> 9.5 and PLT 163> 118.    06/20/2020 Heparin off 0130 for ERCP.  ERCP completed.  Per GI note heparin can be resumed tonight at 7 pm Hg 11.6>9.5>9.6 stable PLT 163>118> 93 dropping.  No bleeding reported.   Goal of Therapy:  Heparin level 0.3-0.7 units/ml Monitor platelets by anticoagulation protocol: Yes   Plan:  No heparin bolus d/t anemia/thrombocytopenia per CCM 4/8 resume heparin IV infusion at 1650 units/hr at 1900 tonight  Check heparin level 8 hrs after resumed at 03 am 4/10 Daily heparin level & CBC  F/u cards recs for long-term anticoagulation  Eudelia Bunch, Pharm.D Clinical Pharmacist [facility] main pharmacy [phone] 06/20/2020 10:56 AM

## 2020-06-20 NOTE — Anesthesia Postprocedure Evaluation (Signed)
Anesthesia Post Note  Patient: Trindon L Martin[place]  Procedure(s) Performed: ENDOSCOPIC RETROGRADE CHOLANGIOPANCREATOGRAPHY (ERCP) (N/A ) SPHINCTEROTOMY     Patient location during evaluation: PACU Anesthesia Type: General Level of consciousness: awake and alert Pain management: pain level controlled Vital Signs Assessment: post-procedure vital signs reviewed and stable Respiratory status: spontaneous breathing, nonlabored ventilation, respiratory function stable and patient connected to nasal cannula oxygen Cardiovascular status: blood pressure returned to baseline and stable Postop Assessment: no apparent nausea or vomiting Anesthetic complications: no   No complications documented.  Last Vitals:  Vitals:   06/20/20 0945 06/20/20 1200  BP: 123/82   Pulse: 73   Resp: 15   Temp:  (!) 36.4 C  SpO2: 97%     Last Pain:  Vitals:   06/20/20 1200  TempSrc: Oral  PainSc:                  Jazir Newey

## 2020-06-20 NOTE — Interval H&P Note (Signed)
History and Physical Interval Note:  06/20/2020 7:18 AM  Warren [name]  has presented today for surgery, with the diagnosis of CBD stone.  The various methods of treatment have been discussed with the patient and family. After consideration of risks, benefits and other options for treatment, the patient has consented to  Procedure(s): ENDOSCOPIC RETROGRADE CHOLANGIOPANCREATOGRAPHY (ERCP) (N/A) as a surgical intervention.  The patient's history has been reviewed, patient examined, no change in status, stable for surgery.  I have reviewed the patient's chart and labs.  Questions were answered to the patient's satisfaction.     Milus Banister

## 2020-06-21 DIAGNOSIS — Z86711 Personal history of pulmonary embolism: Secondary | ICD-10-CM | POA: Diagnosis not present

## 2020-06-21 DIAGNOSIS — I4891 Unspecified atrial fibrillation: Secondary | ICD-10-CM | POA: Diagnosis not present

## 2020-06-21 DIAGNOSIS — Z7901 Long term (current) use of anticoagulants: Secondary | ICD-10-CM

## 2020-06-21 DIAGNOSIS — K8051 Calculus of bile duct without cholangitis or cholecystitis with obstruction: Secondary | ICD-10-CM | POA: Diagnosis not present

## 2020-06-21 LAB — COMPREHENSIVE METABOLIC PANEL
ALT: 35 U/L (ref 0–44)
AST: 54 U/L — ABNORMAL HIGH (ref 15–41)
Albumin: 2.2 g/dL — ABNORMAL LOW (ref 3.5–5.0)
Alkaline Phosphatase: 114 U/L (ref 38–126)
Anion gap: 16 — ABNORMAL HIGH (ref 5–15)
BUN: 39 mg/dL — ABNORMAL HIGH (ref 8–23)
CO2: 19 mmol/L — ABNORMAL LOW (ref 22–32)
Calcium: 8.2 mg/dL — ABNORMAL LOW (ref 8.9–10.3)
Chloride: 94 mmol/L — ABNORMAL LOW (ref 98–111)
Creatinine, Ser: 2.33 mg/dL — ABNORMAL HIGH (ref 0.61–1.24)
GFR, Estimated: 29 mL/min — ABNORMAL LOW (ref >60)
Glucose, Bld: 134 mg/dL — ABNORMAL HIGH (ref 70–99)
Potassium: 3.6 mmol/L (ref 3.5–5.1)
Sodium: 129 mmol/L — ABNORMAL LOW (ref 135–145)
Total Bilirubin: 1.1 mg/dL (ref 0.3–1.2)
Total Protein: 5.1 g/dL — ABNORMAL LOW (ref 6.5–8.1)

## 2020-06-21 LAB — GLUCOSE, CAPILLARY
Glucose-Capillary: 101 mg/dL — ABNORMAL HIGH (ref 70–99)
Glucose-Capillary: 109 mg/dL — ABNORMAL HIGH (ref 70–99)
Glucose-Capillary: 119 mg/dL — ABNORMAL HIGH (ref 70–99)
Glucose-Capillary: 147 mg/dL — ABNORMAL HIGH (ref 70–99)
Glucose-Capillary: 150 mg/dL — ABNORMAL HIGH (ref 70–99)
Glucose-Capillary: 167 mg/dL — ABNORMAL HIGH (ref 70–99)
Glucose-Capillary: 94 mg/dL (ref 70–99)

## 2020-06-21 LAB — CULTURE, BLOOD (ROUTINE X 2)
Special Requests: ADEQUATE
Special Requests: ADEQUATE

## 2020-06-21 LAB — CBC
HCT: 28.6 % — ABNORMAL LOW (ref 39.0–52.0)
Hemoglobin: 9.5 g/dL — ABNORMAL LOW (ref 13.0–17.0)
MCH: 32.5 pg (ref 26.0–34.0)
MCHC: 33.2 g/dL (ref 30.0–36.0)
MCV: 97.9 fL (ref 80.0–100.0)
Platelets: 81 10*3/uL — ABNORMAL LOW (ref 150–400)
RBC: 2.92 MIL/uL — ABNORMAL LOW (ref 4.22–5.81)
RDW: 15.2 % (ref 11.5–15.5)
WBC: 7.8 10*3/uL (ref 4.0–10.5)
nRBC: 0 % (ref 0.0–0.2)

## 2020-06-21 LAB — LIPASE, BLOOD: Lipase: 69 U/L — ABNORMAL HIGH (ref 11–51)

## 2020-06-21 LAB — HEPARIN LEVEL (UNFRACTIONATED)
Heparin Unfractionated: <0.1 IU/mL — ABNORMAL LOW (ref 0.30–0.70)
Heparin Unfractionated: <0.1 IU/mL — ABNORMAL LOW (ref 0.30–0.70)

## 2020-06-21 MED ORDER — LACTATED RINGERS IV BOLUS
1000.0000 mL | Freq: Once | INTRAVENOUS | Status: AC
  Administered 2020-06-21: 1000 mL via INTRAVENOUS

## 2020-06-21 MED ORDER — APIXABAN 5 MG PO TABS
5.0000 mg | ORAL_TABLET | Freq: Two times a day (BID) | ORAL | Status: DC
  Administered 2020-06-21 – 2020-06-25 (×8): 5 mg via ORAL
  Filled 2020-06-21 (×8): qty 1

## 2020-06-21 NOTE — Progress Notes (Signed)
[facility] Progress Note  Chief Complaint: Cholangitis and choledocholithiasis  History:  [name] is feeling well today, denies abdominal pain chest pain or dyspnea.  His A. fib is under better rate control, he has had no GI bleeding witnessed since starting heparin yesterday.  Signout was received from Dr. Ardis Hughs, and I reviewed the ERCP report. [name] has been seen by critical care and cardiology consultants today.  Those notes were reviewed. Objective:   Current Facility-Administered Medications:  .  0.9 %  sodium chloride infusion, , Intravenous, Continuous, Milus Banister, MD, Last Rate: 10 mL/hr at 06/20/20 0757, Continued from Pre-op at 06/20/20 0757 .  0.9 %  sodium chloride infusion, 250 mL, Intravenous, Continuous, Milus Banister, MD .  acetaminophen (TYLENOL) tablet 1,000 mg, 1,000 mg, Oral, Once PRN **OR** acetaminophen (TYLENOL) 160 MG/5ML solution 1,000 mg, 1,000 mg, Oral, Once PRN, Oleta Mouse, MD .  acetaminophen (TYLENOL) tablet 650 mg, 650 mg, Oral, Q4H PRN, 650 mg at 06/19/20 2332 **OR** acetaminophen (TYLENOL) suppository 650 mg, 650 mg, Rectal, Q4H PRN, Milus Banister, MD .  albuterol (PROVENTIL) (2.5 MG/3ML) 0.083% nebulizer solution 2.5 mg, 2.5 mg, Nebulization, Q4H PRN, Milus Banister, MD .  cefTRIAXone (ROCEPHIN) 2 g in sodium chloride 0.9 % 100 mL IVPB, 2 g, Intravenous, Q24H, Hunsucker, Bonna Gains, MD, Stopped at 06/20/20 2148 .  chlorhexidine (PERIDEX) 0.12 % solution 15 mL, 15 mL, Mouth Rinse, BID, Milus Banister, MD, 15 mL at 06/21/20 0920 .  Chlorhexidine Gluconate Cloth 2 % PADS 6 each, 6 each, Topical, Daily, Milus Banister, MD, 6 each at 06/20/20 1934 .  clonazePAM (KLONOPIN) tablet 1 mg, 1 mg, Oral, QHS PRN, Milus Banister, MD, 1 mg at 06/18/20 2306 .  donepezil (ARICEPT) tablet 10 mg, 10 mg, Oral, Daily, Milus Banister, MD, 10 mg at 06/21/20 0920 .  fentaNYL (SUBLIMAZE) injection 25-50 mcg, 25-50 mcg, Intravenous, Q5 min PRN,  Oleta Mouse, MD .  fluticasone (FLONASE) 50 MCG/ACT nasal spray 1 spray, 1 spray, Each Nare, Daily PRN, Milus Banister, MD .  fluticasone furoate-vilanterol (BREO ELLIPTA) 100-25 MCG/INH 1 puff, 1 puff, Inhalation, Daily, Milus Banister, MD, 1 puff at 06/21/20 0920 .  gabapentin (NEURONTIN) capsule 300 mg, 300 mg, Oral, TID WC & HS, Milus Banister, MD, 300 mg at 06/21/20 0755 .  heparin ADULT infusion 100 units/mL (25000 units/280mL), 1,850 Units/hr, Intravenous, Continuous, Angela Adam, [facility], Last Rate: 18.5 mL/hr at 06/21/20 1008, 1,850 Units/hr at 06/21/20 1008 .  HYDROmorphone (DILAUDID) injection 0.5 mg, 0.5 mg, Intravenous, Q3H PRN **OR** HYDROmorphone (DILAUDID) injection 1 mg, 1 mg, Intravenous, Q3H PRN, Milus Banister, MD, 1 mg at 06/21/20 0051 .  MEDLINE mouth rinse, 15 mL, Mouth Rinse, q12n4p, Milus Banister, MD, 15 mL at 06/20/20 1312 .  ondansetron (ZOFRAN) tablet 4 mg, 4 mg, Oral, Q6H PRN **OR** ondansetron (ZOFRAN) injection 4 mg, 4 mg, Intravenous, Q6H PRN, Milus Banister, MD, 4 mg at 06/18/20 2225 .  pantoprazole (PROTONIX) EC tablet 40 mg, 40 mg, Oral, Daily, Milus Banister, MD, 40 mg at 06/21/20 0920 .  roflumilast (DALIRESP) tablet 250 mcg, 250 mcg, Oral, Daily, Milus Banister, MD, 250 mcg at 06/21/20 0920 .  sodium chloride 0.9 % bolus 1,000 mL, 1,000 mL, Intravenous, Once PRN, Milus Banister, MD .  tamsulosin [facility]) capsule 0.4 mg, 0.4 mg, Oral, Daily, Milus Banister, MD, 0.4 mg at 06/21/20 0920 .  umeclidinium bromide (INCRUSE ELLIPTA)  62.5 MCG/INH 1 puff, 1 puff, Inhalation, Daily, Milus Banister, MD, 1 puff at 06/21/20 0755 .  vitamin B-12 (CYANOCOBALAMIN) tablet 1,000 mcg, 1,000 mcg, Oral, Daily, Milus Banister, MD, 1,000 mcg at 06/21/20 0920  . sodium chloride 10 mL/hr at 06/20/20 0757  . sodium chloride    . cefTRIAXone (ROCEPHIN)  IV Stopped (06/20/20 2148)  . heparin 1,850 Units/hr (06/21/20 1008)  . sodium chloride       Vital  signs in last 24 hrs: Vitals:   06/21/20 0800 06/21/20 0805  BP:  (!) 89/56  Pulse: 74   Resp: 17   Temp: 97.6 F (36.4 C)   SpO2: 95%     Intake/Output Summary (Last 24 hours) at 06/21/2020 1103 Last data filed at 06/21/2020 1008 Gross per 24 hour  Intake 1030.31 ml  Output 200 ml  Net 830.31 ml     Physical Exam He is well-appearing.  A. fib on the monitor with a rate in the 80s  HEENT: sclera anicteric, oral mucosa without lesions  Neck: supple, no thyromegaly, JVD or lymphadenopathy  Cardiac: Irregularly irregular without murmurs  Pulm: clear to auscultation bilaterally, normal RR and effort noted  Abdomen: soft, obese, no tenderness, with active bowel sounds. No guarding or palpable hepatosplenomegaly  Skin; warm and dry, no jaundice  Recent Labs:  CBC Latest Ref Rng & Units 06/21/2020 06/20/2020 06/19/2020  WBC 4.0 - 10.5 K/uL 7.8 11.0(H) 10.3  Hemoglobin 13.0 - 17.0 g/dL 9.5(L) 9.6(L) 9.5(L)  Hematocrit 39.0 - 52.0 % 28.6(L) 29.6(L) 29.5(L)  Platelets 150 - 400 K/uL 81(L) 93(L) 118(L)    No results for input(s): INR in the last 168 hours. CMP Latest Ref Rng & Units 06/21/2020 06/20/2020 06/19/2020  Glucose 70 - 99 mg/dL 134(H) 137(H) 60(L)  BUN 8 - 23 mg/dL 39(H) 33(H) 31(H)  Creatinine 0.61 - 1.24 mg/dL 2.33(H) 1.76(H) 1.65(H)  Sodium 135 - 145 mmol/L 129(L) 128(L) 134(L)  Potassium 3.5 - 5.1 mmol/L 3.6 3.8 3.5  Chloride 98 - 111 mmol/L 94(L) 95(L) 100  CO2 22 - 32 mmol/L 19(L) 22 22  Calcium 8.9 - 10.3 mg/dL 8.2(L) 8.0(L) 8.3(L)  Total Protein 6.5 - 8.1 g/dL 5.1(L) 5.1(L) 5.3(L)  Total Bilirubin 0.3 - 1.2 mg/dL 1.1 2.4(H) 4.1(H)  Alkaline Phos 38 - 126 U/L 114 139(H) 192(H)  AST 15 - 41 U/L 54(H) 79(H) 110(H)  ALT 0 - 44 U/L 35 43 54(H)     Radiologic studies:   Assessment & Plan  Assessment:  Ascending cholangitis with Klebsiella bacteremia Choledocholithiasis, relieved by ERCP with sphincterotomy and no stent placement yesterday. A. fib with RVR,  now under better control (long-term use anticoagulation for this).  Plan: Patient is tolerating regular diet, WBC has normalized, renal function stable, A. fib under better control, and LFTs improving.  All the signs indicate clinical improvement.  No further GI intervention planned at present, no specific GI office follow-up necessary.  Recommend continuing him on unfractionated heparin until tomorrow.  If there is no overt GI bleeding at that point in the way of melena or maroon blood per rectum or hematemesis and if he has a stable hemoglobin, and it could be started on oral anticoagulation tomorrow.  GI service signing off, call as needed.  I spent a total of 25 minutes with the patient reviewing hospital notes, imaging reports, pathology (if applicable),  labs and examining the patient as well as establishing an assessment and plan that was discussed with the patient.  >  50% of time was spent in direct patient care.      Nelida Meuse III Office: [phone]

## 2020-06-21 NOTE — Progress Notes (Signed)
ANTICOAGULATION CONSULT NOTE - Initial Consult  Pharmacy Consult for heparin >> apixaban Indication: atrial fibrillation  No Known Allergies  Patient Measurements: Height: 5\' 11"  (180.3 cm) Weight: 91.8 kg (202 lb 6.1 oz) IBW/kg (Calculated) : 75.3 Heparin Dosing Weight: n/a. Use TBW = 91 kg  Vital Signs: Temp: 97.8 F (36.6 C) (04/10 1600) Temp Source: Oral (04/10 1600) BP: 102/48 (04/10 1600) Pulse Rate: 56 (04/10 1600)  Labs: Recent Labs    06/19/20 0431 06/19/20 0727 06/19/20 0935 06/19/20 1939 06/20/20 0221 06/21/20 0248 06/21/20 1549  HGB 9.5*  --   --   --  9.6* 9.5*  --   HCT 29.5*  --   --   --  29.6* 28.6*  --   PLT 118*  --   --   --  93* 81*  --   HEPARINUNFRC  --   --   --  <0.10*  --  <0.10* <0.10*  CREATININE 1.65*  --   --   --  1.76* 2.33*  --   TROPONINIHS  --  1,024* 1,169*  --   --   --   --     Estimated Creatinine Clearance: 33.7 mL/min (A) (by C-G formula based on SCr of 2.33 mg/dL (H)).   Medical History: Past Medical History:  Diagnosis Date  . Allergy   . COPD (chronic obstructive pulmonary disease) ([place])   . GERD (gastroesophageal reflux disease)   . History of pulmonary embolus (PE)   . Hypertension   . Insomnia   . OSA (obstructive sleep apnea)     Medications: Not on anticoagulants PTA  Assessment: Pt is a 71 yoM started on heparin drip for new onset atrial fibrillation and initial concern for ACS with elevated troponin. No bolus per MD on 4/8 due to anemia/thrombocytopenia. GI and cardiology following.   -4/9: ERCP, heparin paused for procedure. Resumed @ 7pm that evening  Today, 06/21/20  HL <0.10 again remains undetectable despite increasing rate of heparin infusion. Currently infusing at 1850 units/hr (~20.5 units/kg/hr)  Confirmed with RN that heparin infusing at correct rate. No issues with pump. Reports bleeding with lab draws but no overt signs of bleeding.   CBC: Hgb low but stable; Plt 81 trending  down  Discussed persistently undetectable HL with CCM; order received to transition patient to apixaban for atrial fibrillation this evening.   Goal of Therapy:  Heparin level 0.3-0.7 units/ml Monitor platelets by anticoagulation protocol: Yes   Plan:   Discontinue heparin drip   Initiate apixaban 5 mg PO BID  CBC, SCr with AM labs tomorrow  Lenis Noon, PharmD 06/21/2020,6:59 PM

## 2020-06-21 NOTE — Progress Notes (Signed)
Progress Note  Patient Name: Warren [name] Date of Encounter: 06/21/2020  [facility] Cardiologist: Maia Petties  Subjective   70 yo with CBD stone and choangitis, developed rapid atrial fib . Pt had ERCP yesterday ,  Feeling much better Still in Afib,  HR is well controlled.  Eating well   Inpatient Medications    Scheduled Meds: . chlorhexidine  15 mL Mouth Rinse BID  . Chlorhexidine Gluconate Cloth  6 each Topical Daily  . donepezil  10 mg Oral Daily  . fluticasone furoate-vilanterol  1 puff Inhalation Daily  . gabapentin  300 mg Oral TID WC & HS  . mouth rinse  15 mL Mouth Rinse q12n4p  . pantoprazole  40 mg Oral Daily  . roflumilast  250 mcg Oral Daily  . tamsulosin  0.4 mg Oral Daily  . umeclidinium bromide  1 puff Inhalation Daily  . cyanocobalamin  1,000 mcg Oral Daily   Continuous Infusions: . sodium chloride 10 mL/hr at 06/20/20 0757  . sodium chloride    . acetaminophen    . cefTRIAXone (ROCEPHIN)  IV Stopped (06/20/20 2148)  . heparin 1,850 Units/hr (06/21/20 0709)  . lactated ringers    . norepinephrine (LEVOPHED) Adult infusion 3 mcg/min (06/20/20 1833)  . sodium chloride     PRN Meds: acetaminophen, acetaminophen **OR** acetaminophen (TYLENOL) oral liquid 160 mg/5 mL, acetaminophen **OR** acetaminophen, albuterol, clonazePAM, fentaNYL (SUBLIMAZE) injection, fluticasone, HYDROmorphone (DILAUDID) injection **OR** HYDROmorphone (DILAUDID) injection, ondansetron **OR** ondansetron (ZOFRAN) IV, sodium chloride   Vital Signs    Vitals:   06/21/20 0300 06/21/20 0400 06/21/20 0500 06/21/20 0800  BP: 101/62 (!) 80/56    Pulse: (!) 52 64    Resp: 13 (!) 9    Temp:    97.6 F (36.4 C)  TempSrc:    Oral  SpO2: 99% 96%    Weight:   91.8 kg   Height:        Intake/Output Summary (Last 24 hours) at 06/21/2020 0831 Last data filed at 06/20/2020 1833 Gross per 24 hour  Intake 609.32 ml  Output --  Net 609.32 ml   Last 3 Weights 06/21/2020 06/20/2020 06/18/2020   Weight (lbs) 202 lb 6.1 oz 191 lb 9.3 oz 190 lb 11.2 oz  Weight (kg) 91.8 kg 86.9 kg 86.5 kg      Telemetry    Afib, HR is well controlled. - Personally Reviewed  ECG     - Personally Reviewed  Physical Exam   GEN: elderly male,  No acute distress.   Neck: No JVD Cardiac:   Irreg. Irreg.  Respiratory: Clear to auscultation bilaterally. GI: Soft, nontender, non-distended  MS: No edema; No deformity. Neuro:  Nonfocal  Psych: Normal affect   Labs    High Sensitivity Troponin:   Recent Labs  Lab 06/19/20 0727 06/19/20 0935  TROPONINIHS 1,024* 1,169*      Chemistry Recent Labs  Lab 06/19/20 0431 06/20/20 0221 06/21/20 0248  NA 134* 128* 129*  K 3.5 3.8 3.6  CL 100 95* 94*  CO2 22 22 19*  GLUCOSE 60* 137* 134*  BUN 31* 33* 39*  CREATININE 1.65* 1.76* 2.33*  CALCIUM 8.3* 8.0* 8.2*  PROT 5.3* 5.1* 5.1*  ALBUMIN 2.1* 2.1* 2.2*  AST 110* 79* 54*  ALT 54* 43 35  ALKPHOS 192* 139* 114  BILITOT 4.1* 2.4* 1.1  GFRNONAA 44* 41* 29*  ANIONGAP 12 11 16*     Hematology Recent Labs  Lab 06/19/20 0431 06/20/20 0221 06/21/20  0248  WBC 10.3 11.0* 7.8  RBC 2.89* 2.92* 2.92*  HGB 9.5* 9.6* 9.5*  HCT 29.5* 29.6* 28.6*  MCV 102.1* 101.4* 97.9  MCH 32.9 32.9 32.5  MCHC 32.2 32.4 33.2  RDW 15.0 15.3 15.2  PLT 118* 93* 81*    BNP Recent Labs  Lab 06/19/20 0727  BNP 1,303.5*     DDimer  Recent Labs  Lab 06/19/20 0727  DDIMER 4.28*     Radiology    DG ERCP BILIARY & PANCREATIC DUCTS  Result Date: 06/20/2020 CLINICAL DATA:  70 year old male with a history of choledocholithiasis EXAM: ERCP TECHNIQUE: Multiple spot images obtained with the fluoroscopic device and submitted for interpretation post-procedure. FLUOROSCOPY TIME:  Fluoroscopy Time:  1 minutes 12 seconds COMPARISON:  None. FINDINGS: Limited intraoperative fluoroscopic spot images during ERCP. Initial image demonstrates endoscope projecting over the upper abdomen with a safety wire in place.  Subsequently there is retrograde infusion of contrast within the extrahepatic biliary system with partial opacification. Surgical changes of cholecystectomy. IMPRESSION: Limited images during ERCP demonstrates only partial opacification of the extrahepatic biliary system, with postsurgical changes of cholecystectomy. Please refer to the dictated operative report for full details of intraoperative findings and procedure. Electronically Signed   By: Corrie Mckusick D.O.   On: 06/20/2020 13:52   ECHOCARDIOGRAM COMPLETE  Result Date: 06/19/2020    ECHOCARDIOGRAM REPORT   Patient Name:   Warren [name] Date of Exam: 06/19/2020 Medical Rec #:  [id number]      Height:       71.0 in Accession #:    [id number]     Weight:       190.7 lb Date of Birth:  Nov 09, 1950      BSA:          2.066 m Patient Age:    45 years       BP:           97/41 mmHg Patient Gender: M              HR:           87 bpm. Exam Location:  Inpatient Procedure: 2D Echo, Color Doppler and Cardiac Doppler Indications:    I48.91* Unspeicified atrial fibrillation  History:        Patient has no prior history of Echocardiogram examinations.                 COPD; Risk Factors:Hypertension.  Sonographer:    Bernadene Person RDCS Referring Phys: [phone] A CALDWELL [place]  1. Left ventricular ejection fraction, by estimation, is 50 to 55%. The left ventricle has low normal function. The left ventricle has no regional wall motion abnormalities. There is mild left ventricular hypertrophy. Left ventricular diastolic parameters are indeterminate.  2. Right ventricular systolic function is normal. The right ventricular size is normal. There is mildly elevated pulmonary artery systolic pressure. The estimated right ventricular systolic pressure is 50.5 mmHg.  3. Left atrial size was mildly dilated.  4. Right atrial size was mildly dilated.  5. The mitral valve is normal in structure. Mild mitral valve regurgitation.  6. The aortic valve is tricuspid. There  is moderate calcification of the aortic valve. Aortic valve regurgitation is not visualized. Mild to moderate aortic valve stenosis. Vmax 2.6 m/s, MG 19 mmHg, AVA 1.5 cm^2, DI 0.47  7. The inferior vena cava is dilated in size with >50% respiratory variability, suggesting right atrial pressure of 8 mmHg. FINDINGS  Left Ventricle: Left  ventricular ejection fraction, by estimation, is 50 to 55%. The left ventricle has low normal function. The left ventricle has no regional wall motion abnormalities. The left ventricular internal cavity size was normal in size. There is mild left ventricular hypertrophy. Left ventricular diastolic parameters are indeterminate. Right Ventricle: The right ventricular size is normal. Right vetricular wall thickness was not well visualized. Right ventricular systolic function is normal. There is mildly elevated pulmonary artery systolic pressure. The tricuspid regurgitant velocity  is 2.98 m/s, and with an assumed right atrial pressure of 8 mmHg, the estimated right ventricular systolic pressure is 35.3 mmHg. Left Atrium: Left atrial size was mildly dilated. Right Atrium: Right atrial size was mildly dilated. Pericardium: There is no evidence of pericardial effusion. Presence of pericardial fat pad. Mitral Valve: The mitral valve is normal in structure. Mild mitral valve regurgitation. Tricuspid Valve: The tricuspid valve is normal in structure. Tricuspid valve regurgitation is trivial. Aortic Valve: The aortic valve is tricuspid. There is moderate calcification of the aortic valve. Aortic valve regurgitation is not visualized. Mild to moderate aortic stenosis is present. Aortic valve mean gradient measures 17.0 mmHg. Aortic valve peak gradient measures 23.7 mmHg. Aortic valve area, by VTI measures 1.50 cm. Pulmonic Valve: The pulmonic valve was not well visualized. Pulmonic valve regurgitation is not visualized. Aorta: The aortic root is normal in size and structure. Venous: The inferior  vena cava is dilated in size with greater than 50% respiratory variability, suggesting right atrial pressure of 8 mmHg. IAS/Shunts: The interatrial septum was not well visualized.  LEFT VENTRICLE PLAX 2D LVIDd:         5.20 cm LVIDs:         3.70 cm LV PW:         1.10 cm LV IVS:        0.80 cm LVOT diam:     2.00 cm LV SV:         72 LV SV Index:   35 LVOT Area:     3.14 cm  RIGHT VENTRICLE RV S prime:     10.80 cm/s TAPSE (M-mode): 1.2 cm LEFT ATRIUM             Index       RIGHT ATRIUM           Index LA diam:        4.30 cm 2.08 cm/m  RA Area:     22.00 cm LA Vol (A2C):   78.1 ml 37.80 ml/m RA Volume:   60.10 ml  29.09 ml/m LA Vol (A4C):   86.0 ml 41.62 ml/m LA Biplane Vol: 84.8 ml 41.04 ml/m  AORTIC VALVE AV Area (Vmax):    1.62 cm AV Area (Vmean):   1.54 cm AV Area (VTI):     1.50 cm AV Vmax:           243.67 cm/s AV Vmean:          195.000 cm/s AV VTI:            0.478 m AV Peak Grad:      23.7 mmHg AV Mean Grad:      17.0 mmHg LVOT Vmax:         125.33 cm/s LVOT Vmean:        95.567 cm/s LVOT VTI:          0.228 m LVOT/AV VTI ratio: 0.48  AORTA Ao Root diam: 3.50 cm MR Peak grad: 92.9 mmHg   TRICUSPID VALVE  MR Mean grad: 70.0 mmHg   TR Peak grad:   35.5 mmHg MR Vmax:      482.00 cm/s TR Vmax:        298.00 cm/s MR Vmean:     407.0 cm/s                           SHUNTS                           Systemic VTI:  0.23 m                           Systemic Diam: 2.00 cm Oswaldo Milian MD Electronically signed by Oswaldo Milian MD Signature Date/Time: 06/19/2020/8:06:09 PM    Final    VAS [place] LOWER EXTREMITY VENOUS (DVT)  Result Date: 06/19/2020  Lower Venous DVT Study Indications: Edema.  Comparison Study: No previous exams Performing Technologist: Rogelia Rohrer  Examination Guidelines: A complete evaluation includes B-mode imaging, spectral Doppler, color Doppler, and power Doppler as needed of all accessible portions of each vessel. Bilateral testing is considered an integral part of a complete  examination. Limited examinations for reoccurring indications may be performed as noted. The reflux portion of the exam is performed with the patient in reverse Trendelenburg.  +---------+---------------+---------+-----------+----------+--------------+ RIGHT    CompressibilityPhasicitySpontaneityPropertiesThrombus Aging +---------+---------------+---------+-----------+----------+--------------+ CFV      Full           Yes      Yes                                 +---------+---------------+---------+-----------+----------+--------------+ SFJ      Full                                                        +---------+---------------+---------+-----------+----------+--------------+ FV Prox  Full           Yes      Yes                                 +---------+---------------+---------+-----------+----------+--------------+ FV Mid   Full           Yes      Yes                                 +---------+---------------+---------+-----------+----------+--------------+ FV DistalFull           Yes      Yes                                 +---------+---------------+---------+-----------+----------+--------------+ PFV      Full                                                        +---------+---------------+---------+-----------+----------+--------------+ POP      Full  Yes      Yes                                 +---------+---------------+---------+-----------+----------+--------------+ PTV      Full                                                        +---------+---------------+---------+-----------+----------+--------------+ PERO     Full                                                        +---------+---------------+---------+-----------+----------+--------------+   +---------+---------------+---------+-----------+----------+--------------+ LEFT     CompressibilityPhasicitySpontaneityPropertiesThrombus Aging  +---------+---------------+---------+-----------+----------+--------------+ CFV      Full           Yes      Yes                                 +---------+---------------+---------+-----------+----------+--------------+ SFJ      Full                                                        +---------+---------------+---------+-----------+----------+--------------+ FV Prox  Full           Yes      Yes                                 +---------+---------------+---------+-----------+----------+--------------+ FV Mid   Full           Yes      Yes                                 +---------+---------------+---------+-----------+----------+--------------+ FV DistalFull           Yes      Yes                                 +---------+---------------+---------+-----------+----------+--------------+ PFV      Full                                                        +---------+---------------+---------+-----------+----------+--------------+ POP      Full           Yes      Yes                                 +---------+---------------+---------+-----------+----------+--------------+ PTV      Full                                                        +---------+---------------+---------+-----------+----------+--------------+  PERO     Full                                                        +---------+---------------+---------+-----------+----------+--------------+     Summary: BILATERAL: - No evidence of deep vein thrombosis seen in the lower extremities, bilaterally. - No evidence of superficial venous thrombosis in the lower extremities, bilaterally. -No evidence of popliteal cyst, bilaterally.   *See table(s) above for measurements and observations. Electronically signed by Deitra Mayo MD on 06/19/2020 at 3:40:29 PM.    Final     Cardiac Studies     Patient Profile     70 y.o. male with new onset Afib in the setting of  cholangitis  Assessment & Plan    1  Atrial fib:   HR is much better .  BP is low  Cont heparin ,  Consider changing to eliquis once we are certain he will not need any further procedures.   2.   Hypotension:   Creatinine is up.  He may be volume depleted. .  Is eating well .   Cont to follow . He is not on any meds that would lower his bp        For questions or updates, please contact [place] Please consult [email or web address] for contact info under        Signed, Mertie Moores, MD  06/21/2020, 8:31 AM

## 2020-06-21 NOTE — Progress Notes (Signed)
ANTICOAGULATION CONSULT NOTE - Brief note  Pharmacy Consult for Heparin Indication: Afib  No Known Allergies  Patient Measurements: Height: 5\' 11"  (180.3 cm) Weight: 86.9 kg (191 lb 9.3 oz) IBW/kg (Calculated) : 75.3 Heparin Dosing Weight: TBW  Assessment: 69 yoM started on Heparin IV on 4/8 for new onset Afib & concern for ACS with elevated troponin.  No bolus per MD d/t decreased Hg 11.6> 9.5 and PLT 163> 118.    Significant events: 4/9: Heparin off 0130 for ERCP.  ERCP completed.  Per GI note heparin can be resumed tonight at 7 pm  06/21/2020 HL < 0.1 Per RN no line interruption, running at 1650 units/hr Hg 11.6>9.5>9.6>9.5 stable PLT 163>118> 93>81 dropping.  No bleeding reported.   Goal of Therapy:  Heparin level 0.3-0.7 units/ml Monitor platelets by anticoagulation protocol: Yes   Plan:  No heparin bolus d/t anemia/thrombocytopenia per CCM 4/8 Increase heparin drip to 1850 units/hr Check heparin level 8 hrs  Daily heparin level & CBC  F/u cards recs for long-term anticoagulation  Dolly Rias RPh 06/21/2020, 5:07 AM

## 2020-06-21 NOTE — Progress Notes (Signed)
NAME:  Warren [name] MRN:  [id number] DOB:  06/23/1950 LOS: 3 ADMISSION DATE:  06/18/2020  CONSULTATION DATE:  06/19/2020 REFERRING MD:  Lovey Newcomer, NP  REASON FOR CONSULTATION:  Hypotension/septic shock     Brief History   N/A  History of present illness   This 70 y.o. male is seen in consultation at the request of Xenia blunt, NP for recommendations on further evaluation and management of hypotension/shock.  The patient was admitted earlier in the evening (06/18/2020) with complaints of 2-1/2 weeks of abdominal pain.  He was diagnosed with cholangitis and was scheduled for ERCP later this morning.  Overnight, the patient became progressively hypotensive despite administration of up to 3 L IV fluids.  Orders were placed to start low-dose norepinephrine.  He is seen after returning from CT.  Of note, the patient has a remote history of cholecystectomy.  In the emergency department, CT angio chest/abdomen/pelvis was obtained on 06/18/2020 and showed an apparent 6 mm stone at the ampulla.  CBD dilatation 15 mm.  Newly dilated main pancreatic duct (5 mm).  He was given a single dose of cefepime/Flagyl/vancomycin in the emergency department.  He has subsequently been started on Zosyn.  He was admitted to the hospitalist service with a working diagnosis of sepsis secondary to cholangitis.  Gastroenterology has been consulted.   Past Medical/Surgical/Social/Family History   Past Medical History:  Diagnosis Date  . Allergy   . COPD (chronic obstructive pulmonary disease) ([place])   . GERD (gastroesophageal reflux disease)   . History of pulmonary embolus ([place])   . Hypertension   . Insomnia   . OSA (obstructive sleep apnea)     Past Surgical History:  Procedure Laterality Date  . CHOLECYSTECTOMY      Social History   Tobacco Use  . Smoking status: Former Smoker    Quit date: 03/15/2015    Years since quitting: 5.2  . Smokeless tobacco: Not on file  Substance Use Topics  . Alcohol use: Not  Currently    Family History  Problem Relation Age of Onset  . Colon cancer Neg Hx      Significant Hospital Events   4/8 hypotensive admitted to PCCM, choledocholithiasis 4/9 ERCP, passed stone, swept, weaning pressors 4/10 Transfer to progressive, pressors off overnight   Consults:  GI PCCM   Procedures:  ERCP 4/9   Significant Diagnostic Tests:  CTA chest/abdomen/pelvis (4/7): 6 mm stone at the ampulla; CBD dilatation, 15 mm; main pancreatic duct dilatation, 5 mm.   Micro Data:   Results for orders placed or performed during the hospital encounter of 06/18/20  Culture, blood (routine x 2)     Status: Abnormal (Preliminary result)   Collection Time: 06/18/20  4:24 PM   Specimen: BLOOD  Result Value Ref Range Status   Specimen Description   Final    BLOOD RIGHT ANTECUBITAL Performed at [facility] [address], [place], [place] [address]    Special Requests   Final    BOTTLES DRAWN AEROBIC AND ANAEROBIC Blood Culture adequate volume Performed at [facility] [address] [place], [place] [address]    Culture  Setup Time   Final    GRAM NEGATIVE RODS IN BOTH AEROBIC AND ANAEROBIC BOTTLES CRITICAL VALUE NOTED.  VALUE IS CONSISTENT WITH PREVIOUSLY REPORTED AND CALLED VALUE. Performed at [facility], [place] [address], [place], [place] [address]    Culture KLEBSIELLA OXYTOCA (A)  Final   Report Status  PENDING  Incomplete  Culture, blood (routine x 2)     Status: Abnormal (Preliminary result)   Collection Time: 06/18/20  4:34 PM   Specimen: BLOOD LEFT FOREARM  Result Value Ref Range Status   Specimen Description   Final    BLOOD LEFT FOREARM Performed at [facility] [address], [place], [place] [address]    Special Requests   Final    BOTTLES DRAWN AEROBIC AND ANAEROBIC Blood Culture adequate volume Performed at [facility] [address] [place], [place]  [address]    Culture  Setup Time   Final    GRAM NEGATIVE RODS IN BOTH AEROBIC AND ANAEROBIC BOTTLES CRITICAL RESULT CALLED TO, READ BACK BY AND VERIFIED WITH: PHARMD D.WOFFORD AT 1051 ON 04/21/20 BY KJ Performed at [facility], [place] [address], [place],  [address]    Culture KLEBSIELLA OXYTOCA (A)  Final   Report Status PENDING  Incomplete  Blood Culture ID Panel (Reflexed)     Status: Abnormal   Collection Time: 06/18/20  4:34 PM  Result Value Ref Range Status   Enterococcus faecalis NOT DETECTED NOT DETECTED Final   Enterococcus Faecium NOT DETECTED NOT DETECTED Final   Listeria monocytogenes NOT DETECTED NOT DETECTED Final   Staphylococcus species NOT DETECTED NOT DETECTED Final   Staphylococcus aureus (BCID) NOT DETECTED NOT DETECTED Final   Staphylococcus epidermidis NOT DETECTED NOT DETECTED Final   Staphylococcus lugdunensis NOT DETECTED NOT DETECTED Final   Streptococcus species NOT DETECTED NOT DETECTED Final   Streptococcus agalactiae NOT DETECTED NOT DETECTED Final   Streptococcus pneumoniae NOT DETECTED NOT DETECTED Final   Streptococcus pyogenes NOT DETECTED NOT DETECTED Final   A.calcoaceticus-baumannii NOT DETECTED NOT DETECTED Final   Bacteroides fragilis NOT DETECTED NOT DETECTED Final   Enterobacterales DETECTED (A) NOT DETECTED Final    Comment: Enterobacterales represent a large order of gram negative bacteria, not a single organism. CRITICAL RESULT CALLED TO, READ BACK BY AND VERIFIED WITH: PHARMD D.WOFFORD ON 06/19/20 AT 1051 BY KJ    Enterobacter cloacae complex NOT DETECTED NOT DETECTED Final   Escherichia coli NOT DETECTED NOT DETECTED Final   Klebsiella aerogenes NOT DETECTED NOT DETECTED Final   Klebsiella oxytoca DETECTED (A) NOT DETECTED Final    Comment: CRITICAL RESULT CALLED TO, READ BACK BY AND VERIFIED WITH: PHARMD D.WOFFORD AT 1051 ON 04/21/20 [place] KJ    Klebsiella pneumoniae NOT DETECTED NOT DETECTED Final   Proteus species NOT  DETECTED NOT DETECTED Final   Salmonella species NOT DETECTED NOT DETECTED Final   Serratia marcescens NOT DETECTED NOT DETECTED Final   Haemophilus influenzae NOT DETECTED NOT DETECTED Final   Neisseria meningitidis NOT DETECTED NOT DETECTED Final   Pseudomonas aeruginosa NOT DETECTED NOT DETECTED Final   Stenotrophomonas maltophilia NOT DETECTED NOT DETECTED Final   Candida albicans NOT DETECTED NOT DETECTED Final   Candida auris NOT DETECTED NOT DETECTED Final   Candida glabrata NOT DETECTED NOT DETECTED Final   Candida krusei NOT DETECTED NOT DETECTED Final   Candida parapsilosis NOT DETECTED NOT DETECTED Final   Candida tropicalis NOT DETECTED NOT DETECTED Final   Cryptococcus neoformans/gattii NOT DETECTED NOT DETECTED Final   CTX-M ESBL NOT DETECTED NOT DETECTED Final   Carbapenem resistance IMP NOT DETECTED NOT DETECTED Final   Carbapenem resistance KPC NOT DETECTED NOT DETECTED Final   Carbapenem resistance NDM NOT DETECTED NOT DETECTED Final   Carbapenem resist OXA 48 LIKE NOT DETECTED NOT DETECTED Final  Carbapenem resistance VIM NOT DETECTED NOT DETECTED Final    Comment: Performed at [facility], [place] [address] [place], [place] [address]  Resp Panel by RT-PCR (Flu A&B, Covid) Nasopharyngeal Swab     Status: None   Collection Time: 06/18/20  5:55 PM   Specimen: Nasopharyngeal Swab; Nasopharyngeal(NP) swabs in vial transport medium  Result Value Ref Range Status   SARS Coronavirus 2 by RT PCR NEGATIVE NEGATIVE Final    Comment: (NOTE) SARS-CoV-2 target nucleic acids are NOT DETECTED.  The SARS-CoV-2 RNA is generally detectable in upper respiratory specimens during the acute phase of infection. The lowest concentration of SARS-CoV-2 viral copies this assay can detect is 138 copies/mL. A negative result does not preclude SARS-Cov-2 infection and should not be used as the sole basis for treatment or other patient management decisions. A negative result may occur  with  improper specimen collection/handling, submission of specimen other than nasopharyngeal swab, presence of viral mutation(s) within the areas targeted by this assay, and inadequate number of viral copies(<138 copies/mL). A negative result must be combined with clinical observations, patient history, and epidemiological information. The expected result is Negative.  Fact Sheet for Patients:  EntrepreneurPulse.com.au  Fact Sheet for Healthcare Providers:  IncredibleEmployment.be  This test is no t yet approved or cleared by the [place] FDA and  has been authorized for detection and/or diagnosis of SARS-CoV-2 by FDA under an Emergency Use Authorization (EUA). This EUA will remain  in effect (meaning this test can be used) for the duration of the COVID-19 declaration under Section 564(b)(1) of the Act, 21 U.S.C.section 360bbb-3(b)(1), unless the authorization is terminated  or revoked sooner.       Influenza A by PCR NEGATIVE NEGATIVE Final   Influenza B by PCR NEGATIVE NEGATIVE Final    Comment: (NOTE) The Xpert Xpress SARS-CoV-2/FLU/RSV plus assay is intended as an aid in the diagnosis of influenza from Nasopharyngeal swab specimens and should not be used as a sole basis for treatment. Nasal washings and aspirates are unacceptable for Xpert Xpress SARS-CoV-2/FLU/RSV testing.  Fact Sheet for Patients: EntrepreneurPulse.com.au  Fact Sheet for Healthcare Providers: IncredibleEmployment.be  This test is not yet approved or cleared by the [place] FDA and has been authorized for detection and/or diagnosis of SARS-CoV-2 by FDA under an Emergency Use Authorization (EUA). This EUA will remain in effect (meaning this test can be used) for the duration of the COVID-19 declaration under Section 564(b)(1) of the Act, 21 U.S.C. section 360bbb-3(b)(1), unless the authorization is terminated  or revoked.  Performed at [facility], [place] [address], [place], [place] [address]   MRSA PCR Screening     Status: None   Collection Time: 06/19/20  4:54 AM   Specimen: Nasopharyngeal  Result Value Ref Range Status   MRSA by PCR NEGATIVE NEGATIVE Final    Comment:        The GeneXpert MRSA Assay (FDA approved for NASAL specimens only), is one component of a comprehensive MRSA colonization surveillance program. It is not intended to diagnose MRSA infection nor to guide or monitor treatment for MRSA infections. Performed at [facility], [place] [address] [place],  [address]       Antimicrobials:  Cefepime/Flagyl/vancomycin (4/7) Zosyn (4/8>>)  Interim history/subjective:  NAEON, pressors off, Cr up a bit, feels thirsty   Objective   BP (!) 80/56 (BP Location: Left Arm)   Pulse 64   Temp 97.6 F (36.4 C) (Oral)  Resp (!) 9   Ht 5\' 11"  (1.803 m)   Wt 91.8 kg   SpO2 96%   BMI 28.23 kg/m     Filed Weights   06/18/20 2030 06-22-20 0500 06/21/20 0500  Weight: 86.5 kg 86.9 kg 91.8 kg    Intake/Output Summary (Last 24 hours) at 06/21/2020 2202 Last data filed at 06/22/2020 1833 Gross per 24 hour  Intake 609.32 ml  Output --  Net 609.32 ml        Examination: [id number] , in NAD Eyes: EOMI, mild icterus CV: tachy, irregular Pulm: NWOB, on RA Abd: soft, NT   Resolved Hospital Problem list   Septic Shock   Assessment & Plan:  Choledocholithiasis and Klebsiella bacteremia: S/p ERCP Jun 23, 2022, stone had passed, dilatation better --Zosyn (consider orals 4/10 pending susceptibilities)  Afib: in setting of septic shock, beta agonist (NE) --s/p amio drip d/c'd due to borderline bradycardia (Still AFib on monitor) --Consider oral amio if needed vs BB if HR improves  Hyperbilirubinemia: improving in setting of resolved choledocholithiasis  AKI on CKD 3b: Cr > 2, dehydration with NPO, sepsis --1L LR bolus 4/10  Best  practice:  Diet:clears, advance as tolerated Pain/Anxiety/Delirium protocol (if indicated): N/A VAP protocol (if indicated): N/A DVT prophylaxis: heparin GI prophylaxis: Protonix Glucose control: N/A Mobility/Activity: OOB to chair CODE STATUS: FULL CODE Family Communication:  n/a Disposition: transfer to progressive   Labs and Studies  Reviewed and as per EMR  CRITICAL CARE n/a

## 2020-06-22 ENCOUNTER — Encounter (HOSPITAL_COMMUNITY): Payer: Self-pay | Admitting: Family Medicine

## 2020-06-22 DIAGNOSIS — K8051 Calculus of bile duct without cholangitis or cholecystitis with obstruction: Secondary | ICD-10-CM | POA: Diagnosis not present

## 2020-06-22 DIAGNOSIS — R7989 Other specified abnormal findings of blood chemistry: Secondary | ICD-10-CM

## 2020-06-22 DIAGNOSIS — I4891 Unspecified atrial fibrillation: Secondary | ICD-10-CM | POA: Diagnosis not present

## 2020-06-22 DIAGNOSIS — I350 Nonrheumatic aortic (valve) stenosis: Secondary | ICD-10-CM | POA: Diagnosis not present

## 2020-06-22 DIAGNOSIS — R6521 Severe sepsis with septic shock: Secondary | ICD-10-CM | POA: Diagnosis not present

## 2020-06-22 DIAGNOSIS — I739 Peripheral vascular disease, unspecified: Secondary | ICD-10-CM | POA: Diagnosis not present

## 2020-06-22 DIAGNOSIS — A419 Sepsis, unspecified organism: Secondary | ICD-10-CM | POA: Diagnosis not present

## 2020-06-22 LAB — GLUCOSE, CAPILLARY
Glucose-Capillary: 85 mg/dL (ref 70–99)
Glucose-Capillary: 105 mg/dL — ABNORMAL HIGH (ref 70–99)
Glucose-Capillary: 91 mg/dL (ref 70–99)
Glucose-Capillary: 95 mg/dL (ref 70–99)
Glucose-Capillary: 78 mg/dL (ref 70–99)

## 2020-06-22 LAB — CBC
HCT: 27.9 % — ABNORMAL LOW (ref 39.0–52.0)
Hemoglobin: 9.1 g/dL — ABNORMAL LOW (ref 13.0–17.0)
MCH: 32.6 pg (ref 26.0–34.0)
MCHC: 32.6 g/dL (ref 30.0–36.0)
MCV: 100 fL (ref 80.0–100.0)
Platelets: 96 10*3/uL — ABNORMAL LOW (ref 150–400)
RBC: 2.79 MIL/uL — ABNORMAL LOW (ref 4.22–5.81)
RDW: 15.4 % (ref 11.5–15.5)
WBC: 5.5 10*3/uL (ref 4.0–10.5)
nRBC: 0 % (ref 0.0–0.2)

## 2020-06-22 LAB — CREATININE, SERUM
Creatinine, Ser: 2.28 mg/dL — ABNORMAL HIGH (ref 0.61–1.24)
GFR, Estimated: 30 mL/min — ABNORMAL LOW (ref >60)

## 2020-06-22 MED ORDER — HYDROCODONE-ACETAMINOPHEN 10-325 MG PO TABS
1.0000 | ORAL_TABLET | Freq: Four times a day (QID) | ORAL | Status: DC | PRN
  Administered 2020-06-22 – 2020-06-23 (×4): 1 via ORAL
  Filled 2020-06-22 (×4): qty 1

## 2020-06-22 NOTE — Progress Notes (Signed)
NAME:  Warren [name] MRN:  [id number] DOB:  [birth year] LOS: 4 ADMISSION DATE:  06/18/2020  CONSULTATION DATE:  06/19/2020 REFERRING MD:  Lovey Newcomer, NP  REASON FOR CONSULTATION:  Hypotension/septic shock     Brief History   N/A  History of present illness   This 70 y.o. male is seen in consultation at the request of Xenia blunt, NP for recommendations on further evaluation and management of hypotension/shock.  The patient was admitted earlier in the evening (06/18/2020) with complaints of 2-1/2 weeks of abdominal pain.  He was diagnosed with cholangitis and was scheduled for ERCP later this morning.  Overnight, the patient became progressively hypotensive despite administration of up to 3 L IV fluids.  Orders were placed to start low-dose norepinephrine.  He is seen after returning from CT.  Of note, the patient has a remote history of cholecystectomy.  In the emergency department, CT angio chest/abdomen/pelvis was obtained on 06/18/2020 and showed an apparent 6 mm stone at the ampulla.  CBD dilatation 15 mm.  Newly dilated main pancreatic duct (5 mm).  He was given a single dose of cefepime/Flagyl/vancomycin in the emergency department.  He has subsequently been started on Zosyn.  He was admitted to the hospitalist service with a working diagnosis of sepsis secondary to cholangitis.  Gastroenterology has been consulted.   Past Medical/Surgical/Social/Family History   Past Medical History:  Diagnosis Date  . Allergy   . COPD (chronic obstructive pulmonary disease) ([place])   . GERD (gastroesophageal reflux disease)   . History of pulmonary embolus (PE)   . Hypertension   . Insomnia   . OSA (obstructive sleep apnea)     Past Surgical History:  Procedure Laterality Date  . CHOLECYSTECTOMY      Social History   Tobacco Use  . Smoking status: Former Smoker    Quit date: 03/15/2015    Years since quitting: 5.2  . Smokeless tobacco: Not on file  Substance Use Topics  . Alcohol use: Not  Currently    Family History  Problem Relation Age of Onset  . Colon cancer Neg Hx      Significant Hospital Events   4/8 hypotensive admitted to PCCM, choledocholithiasis 4/9 ERCP, passed stone, swept, weaning pressors 4/10  pressors off overnight, BP borderline, Eliquis started for AFib 4/11 Transfer to progressive   Consults:  GI PCCM   Procedures:  ERCP 4/9   Significant Diagnostic Tests:  CTA chest/abdomen/pelvis (4/7): 6 mm stone at the ampulla; CBD dilatation, 15 mm; main pancreatic duct dilatation, 5 mm.   Micro Data:   Results for orders placed or performed during the hospital encounter of 06/18/20  Culture, blood (routine x 2)     Status: Abnormal   Collection Time: 06/18/20  4:24 PM   Specimen: BLOOD  Result Value Ref Range Status   Specimen Description   Final    BLOOD RIGHT ANTECUBITAL Performed at [facility] [address] [place], [place] [address]    Special Requests   Final    BOTTLES DRAWN AEROBIC AND ANAEROBIC Blood Culture adequate volume Performed at [facility] [address], [place], [place], [place] [address]    Culture  Setup Time   Final    GRAM NEGATIVE RODS IN BOTH AEROBIC AND ANAEROBIC BOTTLES CRITICAL VALUE NOTED.  VALUE IS CONSISTENT WITH PREVIOUSLY REPORTED AND CALLED VALUE.    Culture (A)  Final    KLEBSIELLA OXYTOCA SUSCEPTIBILITIES PERFORMED ON PREVIOUS CULTURE WITHIN THE LAST  5 DAYS. Performed at [facility], [place] [address] [place], [place] [address]    Report Status 06/21/2020 FINAL  Final  Culture, blood (routine x 2)     Status: Abnormal   Collection Time: 06/18/20  4:34 PM   Specimen: BLOOD LEFT FOREARM  Result Value Ref Range Status   Specimen Description   Final    BLOOD LEFT FOREARM Performed at [facility] [address], [place], [place] [address]    Special Requests   Final    BOTTLES DRAWN AEROBIC AND ANAEROBIC Blood Culture adequate  volume Performed at [facility] [address] [place], [place] [address]    Culture  Setup Time   Final    GRAM NEGATIVE RODS IN BOTH AEROBIC AND ANAEROBIC BOTTLES CRITICAL RESULT CALLED TO, READ BACK BY AND VERIFIED WITH: PHARMD D.WOFFORD AT 1051 ON 04/21/20 BY KJ Performed at [facility], [place] [address], [place], [place] [address]    Culture KLEBSIELLA OXYTOCA (A)  Final   Report Status 06/21/2020 FINAL  Final   Organism ID, Bacteria KLEBSIELLA OXYTOCA  Final      Susceptibility   Klebsiella oxytoca - MIC*    AMPICILLIN >=32 RESISTANT Resistant     CEFAZOLIN 16 SENSITIVE Sensitive     CEFEPIME <=0.12 SENSITIVE Sensitive     CEFTAZIDIME <=1 SENSITIVE Sensitive     CEFTRIAXONE <=0.25 SENSITIVE Sensitive     CIPROFLOXACIN <=0.25 SENSITIVE Sensitive     GENTAMICIN <=1 SENSITIVE Sensitive     IMIPENEM <=0.25 SENSITIVE Sensitive     TRIMETH/SULFA <=20 SENSITIVE Sensitive     AMPICILLIN/SULBACTAM 8 SENSITIVE Sensitive     PIP/TAZO <=4 SENSITIVE Sensitive     * KLEBSIELLA OXYTOCA  Blood Culture ID Panel (Reflexed)     Status: Abnormal   Collection Time: 06/18/20  4:34 PM  Result Value Ref Range Status   Enterococcus faecalis NOT DETECTED NOT DETECTED Final   Enterococcus Faecium NOT DETECTED NOT DETECTED Final   Listeria monocytogenes NOT DETECTED NOT DETECTED Final   Staphylococcus species NOT DETECTED NOT DETECTED Final   Staphylococcus aureus (BCID) NOT DETECTED NOT DETECTED Final   Staphylococcus epidermidis NOT DETECTED NOT DETECTED Final   Staphylococcus lugdunensis NOT DETECTED NOT DETECTED Final   Streptococcus species NOT DETECTED NOT DETECTED Final   Streptococcus agalactiae NOT DETECTED NOT DETECTED Final   Streptococcus pneumoniae NOT DETECTED NOT DETECTED Final   Streptococcus pyogenes NOT DETECTED NOT DETECTED Final   A.calcoaceticus-baumannii NOT DETECTED NOT DETECTED Final   Bacteroides fragilis NOT DETECTED NOT DETECTED Final    Enterobacterales DETECTED (A) NOT DETECTED Final    Comment: Enterobacterales represent a large order of gram negative bacteria, not a single organism. CRITICAL RESULT CALLED TO, READ BACK BY AND VERIFIED WITH: PHARMD D.WOFFORD ON 06/19/20 AT 1051 BY KJ    Enterobacter cloacae complex NOT DETECTED NOT DETECTED Final   Escherichia coli NOT DETECTED NOT DETECTED Final   Klebsiella aerogenes NOT DETECTED NOT DETECTED Final   Klebsiella oxytoca DETECTED (A) NOT DETECTED Final    Comment: CRITICAL RESULT CALLED TO, READ BACK BY AND VERIFIED WITH: PHARMD D.WOFFORD AT 1051 ON 04/21/20 [place] KJ    Klebsiella pneumoniae NOT DETECTED NOT DETECTED Final   Proteus species NOT DETECTED NOT DETECTED Final   Salmonella species NOT DETECTED NOT DETECTED Final   Serratia marcescens NOT DETECTED NOT DETECTED Final   Haemophilus influenzae NOT DETECTED NOT DETECTED Final   Neisseria meningitidis NOT DETECTED  NOT DETECTED Final   Pseudomonas aeruginosa NOT DETECTED NOT DETECTED Final   Stenotrophomonas maltophilia NOT DETECTED NOT DETECTED Final   Candida albicans NOT DETECTED NOT DETECTED Final   Candida auris NOT DETECTED NOT DETECTED Final   Candida glabrata NOT DETECTED NOT DETECTED Final   Candida krusei NOT DETECTED NOT DETECTED Final   Candida parapsilosis NOT DETECTED NOT DETECTED Final   Candida tropicalis NOT DETECTED NOT DETECTED Final   Cryptococcus neoformans/gattii NOT DETECTED NOT DETECTED Final   CTX-M ESBL NOT DETECTED NOT DETECTED Final   Carbapenem resistance IMP NOT DETECTED NOT DETECTED Final   Carbapenem resistance KPC NOT DETECTED NOT DETECTED Final   Carbapenem resistance NDM NOT DETECTED NOT DETECTED Final   Carbapenem resist OXA 48 LIKE NOT DETECTED NOT DETECTED Final   Carbapenem resistance VIM NOT DETECTED NOT DETECTED Final    Comment: Performed at [facility], [address], [place], [place] [address]  Resp Panel by RT-PCR (Flu A&B, Covid) Nasopharyngeal Swab      Status: None   Collection Time: 06/18/20  5:55 PM   Specimen: Nasopharyngeal Swab; Nasopharyngeal(NP) swabs in vial transport medium  Result Value Ref Range Status   SARS Coronavirus 2 by RT PCR NEGATIVE NEGATIVE Final    Comment: (NOTE) SARS-CoV-2 target nucleic acids are NOT DETECTED.  The SARS-CoV-2 RNA is generally detectable in upper respiratory specimens during the acute phase of infection. The lowest concentration of SARS-CoV-2 viral copies this assay can detect is 138 copies/mL. A negative result does not preclude SARS-Cov-2 infection and should not be used as the sole basis for treatment or other patient management decisions. A negative result may occur with  improper specimen collection/handling, submission of specimen other than nasopharyngeal swab, presence of viral mutation(s) within the areas targeted by this assay, and inadequate number of viral copies(<138 copies/mL). A negative result must be combined with clinical observations, patient history, and epidemiological information. The expected result is Negative.  Fact Sheet for Patients:  EntrepreneurPulse.com.au  Fact Sheet for Healthcare Providers:  IncredibleEmployment.be  This test is no t yet approved or cleared by the [place] FDA and  has been authorized for detection and/or diagnosis of SARS-CoV-2 by FDA under an Emergency Use Authorization (EUA). This EUA will remain  in effect (meaning this test can be used) for the duration of the COVID-19 declaration under Section 564(b)(1) of the Act, 21 U.S.C.section 360bbb-3(b)(1), unless the authorization is terminated  or revoked sooner.       Influenza A by PCR NEGATIVE NEGATIVE Final   Influenza B by PCR NEGATIVE NEGATIVE Final    Comment: (NOTE) The Xpert Xpress SARS-CoV-2/FLU/RSV plus assay is intended as an aid in the diagnosis of influenza from Nasopharyngeal swab specimens and should not be used as a sole basis  for treatment. Nasal washings and aspirates are unacceptable for Xpert Xpress SARS-CoV-2/FLU/RSV testing.  Fact Sheet for Patients: EntrepreneurPulse.com.au  Fact Sheet for Healthcare Providers: IncredibleEmployment.be  This test is not yet approved or cleared by the [place] FDA and has been authorized for detection and/or diagnosis of SARS-CoV-2 by FDA under an Emergency Use Authorization (EUA). This EUA will remain in effect (meaning this test can be used) for the duration of the COVID-19 declaration under Section 564(b)(1) of the Act, 21 U.S.C. section 360bbb-3(b)(1), unless the authorization is terminated or revoked.  Performed at [facility], [place] [address] [place], [place] [address]   MRSA PCR Screening     Status: None  Collection Time: 06/19/20  4:54 AM   Specimen: Nasopharyngeal  Result Value Ref Range Status   MRSA by PCR NEGATIVE NEGATIVE Final    Comment:        The GeneXpert MRSA Assay (FDA approved for NASAL specimens only), is one component of a comprehensive MRSA colonization surveillance program. It is not intended to diagnose MRSA infection nor to guide or monitor treatment for MRSA infections. Performed at [facility], [place] [address] [place], [place] [address]       Antimicrobials:  Cefepime/Flagyl/vancomycin (4/7) Zosyn (4/8>>) 4/10 CTX 4/10  Interim history/subjective:  NAEON, pressors off, Cr stable, good UOP   Objective   BP (!) 105/56 (BP Location: Left Arm)   Pulse 65   Temp (!) 97.5 F (36.4 C) (Oral)   Resp 19   Ht 5\' 11"  (1.803 m)   Wt 94.2 kg   SpO2 96%   BMI 28.96 kg/m     Filed Weights   06/29/20 0500 06/21/20 0500 06/22/20 0448  Weight: 86.9 kg 91.8 kg 94.2 kg    Intake/Output Summary (Last 24 hours) at 06/22/2020 7680 Last data filed at 06/22/2020 8811 Gross per 24 hour  Intake 2894.43 ml  Output 1300 ml  Net 1594.43 ml         Examination: SRP:RXYVOP , in NAD Eyes: EOMI, mild icterus CV: tachy, irregular Pulm: NWOB, on RA Abd: soft, NT   Resolved Hospital Problem list   Septic Shock   Assessment & Plan:  Choledocholithiasis and Klebsiella bacteremia: S/p ERCP 2022/06/30, stone had passed, dilatation better --CTX (end 4/14), consider oral levofloxacin to complete course if ready for d/c prior to this  Afib: in setting of septic shock, beta agonist (NE) --s/p amio drip d/c'd due to borderline bradycardia (Still AFib on monitor) --Consider oral amio if needed vs BB if HR improves --Eliquis started 4/10 PM  Hyperbilirubinemia: improving in setting of resolved choledocholithiasis  AKI on CKD 3b: Cr > 2, dehydration with NPO, sepsis --1L LR bolus 4/10 --UOP good --Encourage PO intake  Best practice:  Diet:clears, advance as tolerated Pain/Anxiety/Delirium protocol (if indicated): N/A VAP protocol (if indicated): N/A DVT prophylaxis: heparin GI prophylaxis: Protonix Glucose control: N/A Mobility/Activity: OOB to chair CODE STATUS: FULL CODE Family Communication:  n/a Disposition: transfer to progressive   Labs and Studies  Reviewed and as per EMR  CRITICAL CARE n/a

## 2020-06-22 NOTE — Progress Notes (Addendum)
Progress Note  Patient Name: Warren [name] Date of Encounter: 06/22/2020  Primary Cardiologist: Mertie Moores, MD  Subjective   No CP or SOB. Still with intermittent abd pain.  Inpatient Medications    Scheduled Meds: . apixaban  5 mg Oral BID  . chlorhexidine  15 mL Mouth Rinse BID  . Chlorhexidine Gluconate Cloth  6 each Topical Daily  . donepezil  10 mg Oral Daily  . fluticasone furoate-vilanterol  1 puff Inhalation Daily  . gabapentin  300 mg Oral TID WC & HS  . mouth rinse  15 mL Mouth Rinse q12n4p  . pantoprazole  40 mg Oral Daily  . roflumilast  250 mcg Oral Daily  . tamsulosin  0.4 mg Oral Daily  . umeclidinium bromide  1 puff Inhalation Daily  . cyanocobalamin  1,000 mcg Oral Daily   Continuous Infusions: . sodium chloride 10 mL/hr at 06/20/20 0757  . sodium chloride 20 mL/hr at 06/21/20 1818  . cefTRIAXone (ROCEPHIN)  IV Stopped (06/21/20 2230)  . sodium chloride     PRN Meds: albuterol, clonazePAM, fluticasone, HYDROcodone-acetaminophen, ondansetron **OR** ondansetron (ZOFRAN) IV, sodium chloride   Vital Signs    Vitals:   06/22/20 0500 06/22/20 0600 06/22/20 0700 06/22/20 0800  BP: 109/60 119/63 108/60 (!) 104/58  Pulse: 63 60 (!) 56 63  Resp: 13 14 14 14   Temp:    98.2 F (36.8 C)  TempSrc:    Oral  SpO2: 98% 98% 98% 99%  Weight:      Height:        Intake/Output Summary (Last 24 hours) at 06/22/2020 0849 Last data filed at 06/22/2020 0639 Gross per 24 hour  Intake 2894.43 ml  Output 1300 ml  Net 1594.43 ml   Last 3 Weights 06/22/2020 06/21/2020 06/20/2020  Weight (lbs) 207 lb 10.8 oz 202 lb 6.1 oz 191 lb 9.3 oz  Weight (kg) 94.2 kg 91.8 kg 86.9 kg     Telemetry    Atrial fib with SVR upper 40s-60s  - Personally Reviewed  Physical Exam   GEN: No acute distress.  HEENT: Normocephalic, atraumatic, sclera non-icteric. Neck: No JVD or bruits. Cardiac: irregular, rate controlled, no murmurs, rubs, or gallops.  Respiratory: coarse BS at  bases, no wheezes or rhonchi GI: Soft, nontender, non-distended, BS +x 4. MS: no deformity. Extremities: No clubbing or cyanosis. No edema. Distal pedal pulses are 2+ and equal bilaterally. Neuro:  AAOx3. Follows commands. Psych:  Responds to questions appropriately with a normal affect.  Labs    High Sensitivity Troponin:   Recent Labs  Lab 06/19/20 0727 06/19/20 0935  TROPONINIHS 1,024* 1,169*      Cardiac EnzymesNo results for input(s): TROPONINI in the last 168 hours. No results for input(s): TROPIPOC in the last 168 hours.   Chemistry Recent Labs  Lab 06/19/20 0431 06/20/20 0221 06/21/20 0248 06/22/20 0232  NA 134* 128* 129*  --   K 3.5 3.8 3.6  --   CL 100 95* 94*  --   CO2 22 22 19*  --   GLUCOSE 60* 137* 134*  --   BUN 31* 33* 39*  --   CREATININE 1.65* 1.76* 2.33* 2.28*  CALCIUM 8.3* 8.0* 8.2*  --   PROT 5.3* 5.1* 5.1*  --   ALBUMIN 2.1* 2.1* 2.2*  --   AST 110* 79* 54*  --   ALT 54* 43 35  --   ALKPHOS 192* 139* 114  --   BILITOT 4.1* 2.4* 1.1  --  GFRNONAA 44* 41* 29* 30*  ANIONGAP 12 11 16*  --      Hematology Recent Labs  Lab 06/20/20 0221 06/21/20 0248 06/22/20 0232  WBC 11.0* 7.8 5.5  RBC 2.92* 2.92* 2.79*  HGB 9.6* 9.5* 9.1*  HCT 29.6* 28.6* 27.9*  MCV 101.4* 97.9 100.0  MCH 32.9 32.5 32.6  MCHC 32.4 33.2 32.6  RDW 15.3 15.2 15.4  PLT 93* 81* 96*    BNP Recent Labs  Lab 06/19/20 0727  BNP 1,303.5*     DDimer  Recent Labs  Lab 06/19/20 0727  DDIMER 4.28*     Radiology    DG ERCP BILIARY & PANCREATIC DUCTS  Result Date: 06/20/2020 CLINICAL DATA:  70 year old male with a history of choledocholithiasis EXAM: ERCP TECHNIQUE: Multiple spot images obtained with the fluoroscopic device and submitted for interpretation post-procedure. FLUOROSCOPY TIME:  Fluoroscopy Time:  1 minutes 12 seconds COMPARISON:  None. FINDINGS: Limited intraoperative fluoroscopic spot images during ERCP. Initial image demonstrates endoscope projecting  over the upper abdomen with a safety wire in place. Subsequently there is retrograde infusion of contrast within the extrahepatic biliary system with partial opacification. Surgical changes of cholecystectomy. IMPRESSION: Limited images during ERCP demonstrates only partial opacification of the extrahepatic biliary system, with postsurgical changes of cholecystectomy. Please refer to the dictated operative report for full details of intraoperative findings and procedure. Electronically Signed   By: Corrie Mckusick D.O.   On: 06/20/2020 13:52    Cardiac Studies    1. Left ventricular ejection fraction, by estimation, is 50 to 55%. The  left ventricle has low normal function. The left ventricle has no regional  wall motion abnormalities. There is mild left ventricular hypertrophy.  Left ventricular diastolic  parameters are indeterminate.  2. Right ventricular systolic function is normal. The right ventricular  size is normal. There is mildly elevated pulmonary artery systolic  pressure. The estimated right ventricular systolic pressure is 75.6 mmHg.  3. Left atrial size was mildly dilated.  4. Right atrial size was mildly dilated.  5. The mitral valve is normal in structure. Mild mitral valve  regurgitation.  6. The aortic valve is tricuspid. There is moderate calcification of the  aortic valve. Aortic valve regurgitation is not visualized. Mild to  moderate aortic valve stenosis. Vmax 2.6 m/s, MG 19 mmHg, AVA 1.5 cm^2, DI  0.47  7. The inferior vena cava is dilated in size with >50% respiratory  variability, suggesting right atrial pressure of 8 mmHg.   Patient Profile     70 y.o. male with history of PE 2018 (not on anticoag prior to admission), COPD on nocturnal O2 with chronic DOE, HTN, OSA on CPAP, reported prior RV enlargement on echo, CKD III, admitted 06/18/2020 with 2.5 weeks of abodminal pain. He was found to have ascending cholangitis with Klebsiella bacteremia,  choledocholithiasis, septic shock and AKI. Underwent ERCP with sphincterotomy 4/9 with clinical improvement. Cardiology following for development of atrial fib during admission (initial EKG NSR).  Assessment & Plan    1. Ascending cholangitis and choledocholithiasis - s/p ERCP 4/9 - GI has signed off, followed by PCCM - other medical issues include hyponatremia, anemia/thrombocytopenia, AKI, hypoalbuminemia, COPD, transaminitis, abnormal thyroid studies (TSH suppressed at 0.121, free T4 elevated, total T3 decreased - further management per internal medicine team given association with hyperthyroid/PAF)  2. Paroxysmal atrial fib - CHADSVASC 3 for HTN, age, vascular disease  - transitioned to apixaban yesterday PM - need to watch Hgb, platelets -> further management per  primary team - amiodarone discontinued 4/9 - not currently requiring any rate controlling agents - would hold off consideration of cardioversion given rate controlled and ongoing medical issues  3. Elevated troponin - 06/19/20: hsTroponin 1024->1169 - 2D echo with normal LVEF - denies chest pain but has had a history of left shoulder pain (unclear relationship to #1) - will discuss further management with MD - consider statin once once liver function normalizes  4. Reported prior h/o RV enlargement per consult note - 2D echo 4/8 with normal RV - mildly elevated PASP may be sequelae of chronic lung issues   5. Mild MR, mild-moderate AS by echo 06/19/20 - follow as OP  6. PAD  - per CT report, patient had R CFA, bilateral renal artery and celiac disease - can follow as OP after recovery  For questions or updates, please contact [place] Please consult [email or web address] for contact info under Cardiology/STEMI.  Signed, Charlie Pitter, PA-C 06/22/2020, 8:49 AM    Personally seen and examined. Agree with above.   70 year old post ascending cholangitis ERCP on 4/9, paroxysmal atrial fibrillation on Eliquis restarted with  elevated troponin value of 1100, mild to moderate aortic stenosis  GEN: Well nourished, well developed, in no acute distress  HEENT: normal  Neck: no JVD, carotid bruits, or masses Cardiac: Irregularly irregular normal rate; 2/6 systolic murmur, no rubs, or gallops,no edema  Respiratory:  clear to auscultation bilaterally, normal work of breathing GI: soft, nontender, nondistended, + BS MS: no deformity or atrophy  Skin: warm and dry, no rash Neuro:  Alert and Oriented x 3, Strength and sensation are intact Psych: euthymic mood, full affect  Paroxysmal atrial fibrillation -Appropriately on Eliquis -Off of amiodarone given bradycardia. -Agree with holding off on cardioversion at this time given his adequate overall rate control.  Elevated troponin -1100.  Likely demand ischemia in the setting of his cholangitis. -Once he is over his current acute illness, could consider further evaluation, Lexiscan stress test for instance in the outpatient setting or potentially coronary CTA if we are able to achieve restoration of sinus rhythm. -Echocardiogram reassuring  Peripheral arterial disease -Continue to follow as outpatient.  Goal-directed medical therapy as tolerated.  Mild to moderate aortic stenosis -At this point should be of no clinical significance.  We will continue to monitor as outpatient.  Candee Furbish, MD

## 2020-06-22 NOTE — Evaluation (Signed)
Physical Therapy Evaluation Patient Details Name: Warren [name] MRN: [id number] DOB: 12-29-1950 Today's Date: 06/22/2020   History of Present Illness  70 y.o. male presenting with 2.5 weeks of abdominal pain. Dx of cholangitis, septic shock, hypotension,  s/p ERCP 06/20/20. PMH: COPD on 3 L oxygen at night, GERD, HTN, pancreatitis, history of pulmonary embolism, s/p cholecystectomy  Clinical Impression  Pt admitted with above diagnosis. Pt ambulated 58' with RW, distance limited by 3/4 fatigue, SaO2 87% on room air walking. At baseline, pt ambulates with a cane and uses home O2 at night.  Pt currently with functional limitations due to the deficits listed below (see PT Problem List). Pt will benefit from skilled PT to increase their independence and safety with mobility to allow discharge to the venue listed below.       Follow Up Recommendations Home health PT    Equipment Recommendations  None recommended by PT    Recommendations for Other Services       Precautions / Restrictions Precautions Precautions: Fall Precaution Comments: monitor O2 Restrictions Weight Bearing Restrictions: No      Mobility  Bed Mobility Overal bed mobility: Modified Independent             General bed mobility comments: used rail, HOB up    Transfers Overall transfer level: Needs assistance Equipment used: Rolling walker (2 wheeled) Transfers: Sit to/from Stand Sit to Stand: Min guard;From elevated surface         General transfer comment: min/guard safety  Ambulation/Gait Ambulation/Gait assistance: Min guard Gait Distance (Feet): 80 Feet Assistive device: Rolling walker (2 wheeled) Gait Pattern/deviations: Step-through pattern;Decreased stride length Gait velocity: decr   General Gait Details: steady, no loss of balance, pt reports a "catch" in L hip, distance limited by 3/4 dyspnea, SaO2 87% on room air walking, 94% on room air at rest  Stairs            Wheelchair  Mobility    Modified Rankin (Stroke Patients Only)       Balance Overall balance assessment: History of Falls;Modified Independent;Needs assistance   Sitting balance-Leahy Scale: Good       Standing balance-Leahy Scale: Fair Standing balance comment: steady with RW walking                             Pertinent Vitals/Pain Pain Assessment: No/denies pain    Home Living Family/patient expects to be discharged to:: (P) Private residence Living Arrangements: (P) Children Available Help at Discharge: (P) Family   Home Access: (P) Stairs to enter Entrance Stairs-Rails: (P) Can reach both;Left;Right Entrance Stairs-Number of Steps: (P) 4 Home Layout: (P) One level Home Equipment: (P) Cane - single point;Walker - 2 wheels      Prior Function Level of Independence: (P) Independent with assistive device(s)         Comments: (P) walks with cane     Hand Dominance        Extremity/Trunk Assessment   Upper Extremity Assessment Upper Extremity Assessment: Overall WFL for tasks assessed    Lower Extremity Assessment Lower Extremity Assessment: Overall WFL for tasks assessed    Cervical / Trunk Assessment Cervical / Trunk Assessment: Normal  Communication   Communication: (P) HOH  Cognition Arousal/Alertness: Awake/alert Behavior During Therapy: WFL for tasks assessed/performed Overall Cognitive Status: Within Functional Limits for tasks assessed  General Comments      Exercises     Assessment/Plan    PT Assessment Patient needs continued PT services  PT Problem List Decreased mobility;Decreased activity tolerance;Cardiopulmonary status limiting activity       PT Treatment Interventions Gait training;Therapeutic activities    PT Goals (Current goals can be found in the Care Plan section)  Acute Rehab PT Goals Patient Stated Goal: be able to walk farther PT Goal Formulation: With  patient Time For Goal Achievement: 07/06/20 Potential to Achieve Goals: Good    Frequency Min 3X/week   Barriers to discharge        Co-evaluation               AM-PAC PT "6 Clicks" Mobility  Outcome Measure Help needed turning from your back to your side while in a flat bed without using bedrails?: A Little Help needed moving from lying on your back to sitting on the side of a flat bed without using bedrails?: A Little Help needed moving to and from a bed to a chair (including a wheelchair)?: A Little Help needed standing up from a chair using your arms (e.g., wheelchair or bedside chair)?: A Little Help needed to walk in hospital room?: A Little Help needed climbing 3-5 steps with a railing? : A Little 6 Click Score: 18    End of Session Equipment Utilized During Treatment: Gait belt Activity Tolerance: Patient limited by fatigue Patient left: in chair;with call bell/phone within reach Nurse Communication: Mobility status PT Visit Diagnosis: Difficulty in walking, not elsewhere classified (R26.2);History of falling (Z91.81)    Time: 2130-8657 PT Time Calculation (min) (ACUTE ONLY): 15 min   Charges:   PT Evaluation $PT Eval Moderate Complexity: 1 Mod          Philomena Doheny PT 06/22/2020  Acute Rehabilitation Services Pager [phone] Office [phone]

## 2020-06-22 NOTE — Progress Notes (Addendum)
Attempted to call report nurse asked to call back in 5 minutes [phone] 4th floor

## 2020-06-22 NOTE — TOC Progression Note (Signed)
Transition of Care [facility]) - Progression Note    Patient Details  Name: Warren [name] MRN: [id number] Date of Birth: May 12, 1950  [facility]) CM/SW Contact  Leeroy Cha, RN Phone Number: 06/22/2020, 7:34 AM  Clinical Narrative:    [place] Events   4/8 hypotensive admitted to PCCM, choledocholithiasis 4/9 ERCP, passed stone, swept, weaning pressors 4/10 Transfer to progressive, pressors off overnight Iv rocephin. o2 at 2l/min via [place]. Plan: to return to home once medically cleared.   Expected Discharge Plan: Home/Self Care Barriers to Discharge: Continued Medical Work up  Expected Discharge Plan and Services Expected Discharge Plan: Home/Self Care   Discharge Planning Services: CM Consult   Living arrangements for the past 2 months: Single Family Home                                       Social Determinants of Health (SDOH) Interventions    Readmission Risk Interventions No flowsheet data found.

## 2020-06-22 NOTE — Progress Notes (Signed)
Pt transported to 4th floor room 1403 via wheelchair received night time medication before transfer. Pain medication explained to Pt due at 9:45PM Pt responded "I understand". Pt does not to appear to be in distress at this time. Report given to RN

## 2020-06-23 ENCOUNTER — Inpatient Hospital Stay (HOSPITAL_COMMUNITY): Payer: Medicare Other

## 2020-06-23 ENCOUNTER — Encounter (HOSPITAL_COMMUNITY): Payer: Self-pay | Admitting: Family Medicine

## 2020-06-23 DIAGNOSIS — K8309 Other cholangitis: Secondary | ICD-10-CM | POA: Diagnosis not present

## 2020-06-23 DIAGNOSIS — R7989 Other specified abnormal findings of blood chemistry: Secondary | ICD-10-CM | POA: Diagnosis not present

## 2020-06-23 DIAGNOSIS — I4891 Unspecified atrial fibrillation: Secondary | ICD-10-CM | POA: Diagnosis not present

## 2020-06-23 DIAGNOSIS — M79601 Pain in right arm: Secondary | ICD-10-CM

## 2020-06-23 LAB — BASIC METABOLIC PANEL
Anion gap: 8 (ref 5–15)
BUN: 32 mg/dL — ABNORMAL HIGH (ref 8–23)
CO2: 25 mmol/L (ref 22–32)
Calcium: 8.4 mg/dL — ABNORMAL LOW (ref 8.9–10.3)
Chloride: 99 mmol/L (ref 98–111)
Creatinine, Ser: 1.53 mg/dL — ABNORMAL HIGH (ref 0.61–1.24)
GFR, Estimated: 48 mL/min — ABNORMAL LOW (ref >60)
Glucose, Bld: 86 mg/dL (ref 70–99)
Potassium: 3.4 mmol/L — ABNORMAL LOW (ref 3.5–5.1)
Sodium: 132 mmol/L — ABNORMAL LOW (ref 135–145)

## 2020-06-23 LAB — GLUCOSE, CAPILLARY
Glucose-Capillary: 77 mg/dL (ref 70–99)
Glucose-Capillary: 120 mg/dL — ABNORMAL HIGH (ref 70–99)
Glucose-Capillary: 140 mg/dL — ABNORMAL HIGH (ref 70–99)
Glucose-Capillary: 131 mg/dL — ABNORMAL HIGH (ref 70–99)

## 2020-06-23 LAB — CBC
HCT: 30.1 % — ABNORMAL LOW (ref 39.0–52.0)
Hemoglobin: 9.9 g/dL — ABNORMAL LOW (ref 13.0–17.0)
MCH: 32.8 pg (ref 26.0–34.0)
MCHC: 32.9 g/dL (ref 30.0–36.0)
MCV: 99.7 fL (ref 80.0–100.0)
Platelets: 145 10*3/uL — ABNORMAL LOW (ref 150–400)
RBC: 3.02 MIL/uL — ABNORMAL LOW (ref 4.22–5.81)
RDW: 15.3 % (ref 11.5–15.5)
WBC: 6 10*3/uL (ref 4.0–10.5)
nRBC: 0 % (ref 0.0–0.2)

## 2020-06-23 LAB — HEPATIC FUNCTION PANEL
ALT: 24 U/L (ref 0–44)
AST: 27 U/L (ref 15–41)
Albumin: 2 g/dL — ABNORMAL LOW (ref 3.5–5.0)
Alkaline Phosphatase: 121 U/L (ref 38–126)
Bilirubin, Direct: 0.4 mg/dL — ABNORMAL HIGH (ref 0.0–0.2)
Indirect Bilirubin: 0.7 mg/dL (ref 0.3–0.9)
Total Bilirubin: 1.1 mg/dL (ref 0.3–1.2)
Total Protein: 5 g/dL — ABNORMAL LOW (ref 6.5–8.1)

## 2020-06-23 LAB — LIPASE, BLOOD: Lipase: 81 U/L — ABNORMAL HIGH (ref 11–51)

## 2020-06-23 LAB — BRAIN NATRIURETIC PEPTIDE: B Natriuretic Peptide: 1505.3 pg/mL — ABNORMAL HIGH (ref 0.0–100.0)

## 2020-06-23 MED ORDER — ACETAMINOPHEN 325 MG PO TABS
650.0000 mg | ORAL_TABLET | Freq: Four times a day (QID) | ORAL | Status: DC | PRN
  Administered 2020-06-24: 650 mg via ORAL
  Filled 2020-06-23: qty 2

## 2020-06-23 MED ORDER — OXYCODONE HCL 5 MG PO TABS
10.0000 mg | ORAL_TABLET | ORAL | Status: DC | PRN
  Administered 2020-06-23 – 2020-06-25 (×9): 10 mg via ORAL
  Filled 2020-06-23 (×9): qty 2

## 2020-06-23 MED ORDER — OXYCODONE HCL 5 MG PO TABS
5.0000 mg | ORAL_TABLET | ORAL | Status: DC | PRN
  Administered 2020-06-24: 5 mg via ORAL
  Filled 2020-06-23 (×2): qty 1

## 2020-06-23 MED ORDER — DOXYCYCLINE HYCLATE 100 MG PO TABS
100.0000 mg | ORAL_TABLET | Freq: Two times a day (BID) | ORAL | Status: DC
  Administered 2020-06-23 – 2020-06-25 (×4): 100 mg via ORAL
  Filled 2020-06-23 (×4): qty 1

## 2020-06-23 NOTE — Progress Notes (Signed)
Right upper extremity venous duplex has been completed. Preliminary results can be found in CV Proc through chart review.  Results were given to the patient's nurse, Ify.  06/23/20 12:05 PM Warren [name] RVT

## 2020-06-23 NOTE — Care Management Important Message (Signed)
Important Message  Patient Details IM Letter given to the Patient. Name: Egbert L Martinique MRN: [id number] Date of Birth: 08/25/1950   Medicare Important Message Given:  Yes     Kerin Salen 06/23/2020, 10:34 AM

## 2020-06-23 NOTE — Discharge Instructions (Signed)
Information on my medicine - ELIQUIS (apixaban)  This medication education was reviewed with me or my healthcare representative as part of my discharge preparation. The pharmacist that spoke with me during my hospital stay was: Kristopher Oppenheim, Student-PharmD  Why was Eliquis prescribed for you? Eliquis was prescribed for you to reduce the risk of a blood clot forming that can cause a stroke if you have a medical condition called atrial fibrillation (a type of irregular heartbeat).  What do You need to know about Eliquis? Take your Eliquis TWICE DAILY - one tablet in the morning and one tablet in the evening with or without food. If you have difficulty swallowing the tablet whole please discuss with your pharmacist how to take the medication safely.  Take Eliquis exactly as prescribed by your doctor and DO NOT stop taking Eliquis without talking to the doctor who prescribed the medication.  Stopping may increase your risk of developing a stroke.  Refill your prescription before you run out.  After discharge, you should have regular check-up appointments with your healthcare provider that is prescribing your Eliquis.  In the future your dose may need to be changed if your kidney function or weight changes by a significant amount or as you get older.  What do you do if you miss a dose? If you miss a dose, take it as soon as you remember on the same day and resume taking twice daily.  Do not take more than one dose of ELIQUIS at the same time to make up a missed dose.  Important Safety Information A possible side effect of Eliquis is bleeding. You should call your healthcare provider right away if you experience any of the following: ? Bleeding from an injury or your nose that does not stop. ? Unusual colored urine (red or dark brown) or unusual colored stools (red or black). ? Unusual bruising for unknown reasons. ? A serious fall or if you hit your head (even if there is no  bleeding).  Some medicines may interact with Eliquis and might increase your risk of bleeding or clotting while on Eliquis. To help avoid this, consult your healthcare provider or pharmacist prior to using any new prescription or non-prescription medications, including herbals, vitamins, non-steroidal anti-inflammatory drugs (NSAIDs) and supplements.  This website has more information on Eliquis (apixaban): http://www.eliquis.com/eliquis/home

## 2020-06-23 NOTE — TOC Progression Note (Signed)
Transition of Care [facility]) - Progression Note    Patient Details  Name: Voyd L Martinique MRN: [id number] Date of Birth: Jul 29, 1950  [facility]) CM/SW Contact  Arlin Sass, Juliann Pulse, RN Phone Number: 06/23/2020, 1:14 PM  Clinical Narrative: PT recc HHPT/no preference for [facility] agency-[facility] rep Malachy Mood following for HHPT ordered. Patient states uses his sister's home 02;his pulmonary Dr. Has ordered home 02 but it hasn't arrived to his hom yet-[facility] rep Quillian Quince will f/u on who provides home 02-if home 02 needed can arrange with documented 02 sats, & home 02 order. Patient has own transport home.     Expected Discharge Plan: [place] Barriers to Discharge: Continued Medical Work up  Expected Discharge Plan and Services Expected Discharge Plan: [place]   Discharge Planning Services: CM Consult   Living arrangements for the past 2 months: Single Family Home                           HH Arranged: PT [place]: [place] Date [place]: 06/23/20 Time [place]: [phone] Representative spoke with at [place]: [place] Determinants of Health ([place]) Interventions    Readmission Risk Interventions No flowsheet data found.

## 2020-06-23 NOTE — Progress Notes (Addendum)
PROGRESS NOTE    Warren [name]  BMW:[id number] DOB: 07/01/1950 DOA: 06/18/2020 PCP: Imagene Riches, NP   Chief Complaint  Patient presents with  . Abdominal Pain   Brief Narrative:  Warren [name] is Warren [name] 70 y.o. male with medical history significant of COPD on 3 L oxygen at night, GERD, HTN, pancreatitis, history of pulmonary embolism, s/p cholecystectomy presenting with 2.5 weeks of abdominal pain.  He was admitted with cholangitis and had imaging notable for Baneen Wieseler 6 mm stone at the ampulla with significant common bile duct and main pancreatic duct dilatation.  He was transferred to the ICU on 4/8 for hypotension and afib with RVR in the setting of sepsis.  He's now s/p ERCP on 4/9, stone passed, limited biliary sphincterotomy, swept.  Now found to have klebsiella oxytoca bacteremia, on appropriate antibiotics.  Transferred back to [facility] on 4/12.  He's approaching appropriateness for discharge, but still has abdominal discomfort.  Suspect he'll be ready in 24-48 hrs pending improvement in symptoms, stability.    Assessment & Plan:   Principal Problem:   Choledocholithiasis with obstruction Active Problems:   GERD (gastroesophageal reflux disease)   S/P cholecystectomy   History of pancreatitis   History of pulmonary embolism   COPD (chronic obstructive pulmonary disease) (HCC)   Atrial fibrillation with rapid ventricular response (HCC)   Septic shock (HCC)  Septic Shock secondary to Cholangitis  Klebsiella Bacteremia  Cholodocholithiasis  Abdominal Pain CT with 6 mm stone at ampulla with significant common bile duct and main pancreatic duct dilatation ERCP 4/9 with periampullary duodenal diverticulum.  Minimally dilated CBD without stones.  Limited biliary sphincterotomy, duct swept.  Stone suspected to have passed prior to ERCP. Blood cx from 4/7 with klebsiella oxytoca - resistant to ampicillin  Vanc/cefepime/flagy 4/7.  Zosyn 4/7-4/9.  Ceftriaxone 4/9 - present.  Pressors weaned off  4/9 Abdominal discomfort today, adjust pain meds - follow  GI now signed off - noted oral anticoagulation ok to start 4/11.   Met criteria for septic shock with tachycardia, tachypnea, leukocytosis, fever in setting of cholangitis/bacteremia.    Atrial Fibrillation with RVR chadvasc 3 (age, HTN, vascular disease) Transitioned to eliquis  Amiodarone d/c'd Per cardiology, appreciate assistance  Bilateral LE Edema Follow repeat BNP and CXR Echo as noted below  Mildly Elevated PASP Per cards  Elevated Troponin  Demand Ischemia vs NSTEMI Echo 4/8 with no RWMA, low normal EF (see report) Per cards, consider ischemic evaluation outpatient Consider statin prior to discharge pending liver function   RUE pain Related to attempted IV placement with EMS Follow [place] UE -> addendum superficial thrombosis - on repeat evaluation, erythema seems to have worsened since this AM -> CT upper extremity  Acute Kidney Injury Baseline creatinine unclear, follow (in 2009, creatinine <1) Creatinine 2 on presentation Has improved to ~1.5 with fluctuations (creatinine peaked at 2.3 on 4/10) Continue to monitor, no hydro on imaging  Hyponatremia Mild, continue to monitor - improved  Hypoglycemia improved  Hx COPD on 3 L at night  CXR with mild LLL atelecatasis/infiltrate, possible small L effusion -> CT chest without effusion, but notable for severe centrilobular and paraseptal emphysema with diffuse bronchial wall thickening Continue home meds with daliresp, trelegy ellipta   Hx PE Notes he took 6 months of anticoagulation and was no longer on anticoagulation at presentation (now on eliquis as noted above)    BPH Continue flomax  Anxiety Continue klonopin nightly prn  GERD Continue PPI  Chronic Pain On  norco 10 mg q6 hrs at home - currently receiving IV dilaudid Continue gabapentin     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   Difficult to place patient No  Consultants:   GI  Cardiology  PCCM  Procedures:  Echo IMPRESSIONS    1. Left ventricular ejection fraction, by estimation, is 50 to 55%. The  left ventricle has low normal function. The left ventricle has no regional  wall motion abnormalities. There is mild left ventricular hypertrophy.  Left ventricular diastolic  parameters are indeterminate.  2. Right ventricular systolic function is normal. The right ventricular  size is normal. There is mildly elevated pulmonary artery systolic  pressure. The estimated right ventricular systolic pressure is 24.8 mmHg.  3. Left atrial size was mildly dilated.  4. Right atrial size was mildly dilated.  5. The mitral valve is normal in structure. Mild mitral valve  regurgitation.  6. The aortic valve is tricuspid. There is moderate calcification of the  aortic valve. Aortic valve regurgitation is not visualized. Mild to  moderate aortic valve stenosis. Vmax 2.6 m/s, MG 19 mmHg, AVA 1.5 cm^2, DI  0.47  7. The inferior vena cava is dilated in size with >50% respiratory  variability, suggesting right atrial pressure of 8 mmHg.  LE [place] Summary:  BILATERAL:  - No evidence of deep vein thrombosis seen in the lower extremities,  bilaterally.  - No evidence of superficial venous thrombosis in the lower extremities,  bilaterally.  -No evidence of popliteal cyst, bilaterally.   ERCP on 4/9  Antimicrobials: Anti-infectives (From admission, onward)    Start     Dose/Rate Route Frequency Ordered Stop   06/20/20 2200  cefTRIAXone (ROCEPHIN) 2 g in sodium chloride 0.9 % 100 mL IVPB        2 g 200 mL/hr over 30 Minutes Intravenous Every 24 hours 06/20/20 1116     06/19/20 0000  piperacillin-tazobactam (ZOSYN) IVPB 3.375 g  Status:  Discontinued        3.375 g 12.5 mL/hr over 240 Minutes Intravenous Every 8 hours 06/18/20 1720 06/20/20 1116   06/18/20 1615  vancomycin (VANCOREADY) IVPB 1500 mg/300 mL        1,500 mg 150 mL/hr over 120 Minutes Intravenous  Once 06/18/20 1600 06/18/20 2004   06/18/20 1600  ceFEPIme (MAXIPIME) 2 g in sodium chloride 0.9 % 100 mL IVPB        2 g 200 mL/hr over 30 Minutes Intravenous  Once 06/18/20 1552 06/18/20 1737   06/18/20 1600  metroNIDAZOLE (FLAGYL) IVPB 500 mg        500 mg 100 mL/hr over 60 Minutes Intravenous  Once 06/18/20 1552 06/18/20 1737   06/18/20 1600  vancomycin (VANCOCIN) IVPB 1000 mg/200 mL premix  Status:  Discontinued        1,000 mg 200 mL/hr over 60 Minutes Intravenous  Once 06/18/20 1552  06/18/20 1600      Subjective: Complaints of abdominal pain, worse over past few days  Objective: Vitals:   06/22/20 2200 06/23/20 0218 06/23/20 0500 06/23/20 0705  BP: 127/76 127/63 116/68   Pulse: 63 68 65   Resp: 18 18 20    Temp: 98.1 F (36.7 C) (!) 97.5 F (36.4 C) 98.5 F (36.9 C)   TempSrc: Oral Oral Oral   SpO2: (!) 89% 92% 94% 96%  Weight:   92 kg   Height:        Intake/Output Summary (Last 24 hours) at 06/23/2020 0902 Last data filed at 06/23/2020 0500 Gross per 24 hour  Intake 440 ml  Output 1525 ml  Net -1085 ml   Filed Weights   06/21/20 0500 06/22/20 0448 06/23/20 0500  Weight: 91.8 kg 94.2 kg 92 kg    Examination:  General exam: Appears calm and comfortable  Respiratory system: Clear to auscultation. Respiratory effort normal. Cardiovascular system: S1 & S2 heard, RRR.  Gastrointestinal system: Abdomen is distended, no significant TTP Central nervous  system: Alert and oriented. No focal neurological deficits. Extremities: 1+ LEE Skin: No rashes, lesions or ulcers Psychiatry: Judgement and insight appear normal. Mood & affect appropriate.     Data Reviewed: I have personally reviewed following labs and imaging studies  CBC: Recent Labs  Lab 06/18/20 1353 06/18/20 1407 06/19/20 0431 06/20/20 0221 06/21/20 0248 06/22/20 0232 06/23/20 0406  WBC 13.1*  --  10.3 11.0* 7.8 5.5 6.0  NEUTROABS 12.4*  --  9.4*  --   --   --   --   HGB 11.0*   < > 9.5* 9.6* 9.5* 9.1* 9.9*  HCT 33.7*   < > 29.5* 29.6* 28.6* 27.9* 30.1*  MCV 101.8*  --  102.1* 101.4* 97.9 100.0 99.7  PLT 163  --  118* 93* 81* 96* 145*   < > = values in this interval not displayed.    Basic Metabolic Panel: Recent Labs  Lab 06/18/20 1353 06/18/20 1407 06/19/20 0431 06/20/20 0221 06/21/20 0248 06/22/20 0232 06/23/20 0406  NA 131* 130* 134* 128* 129*  --  132*  K 3.9 3.9 3.5 3.8 3.6  --  3.4*  CL 94* 93* 100 95* 94*  --  99  CO2 23  --  22 22 19*  --  25  GLUCOSE 61* 58* 60* 137* 134*  --  86  BUN 27* 25* 31* 33* 39*  --  32*  CREATININE 2.04* 2.10* 1.65* 1.76* 2.33* 2.28* 1.53*  CALCIUM 8.1*  --  8.3* 8.0* 8.2*  --  8.4*  MG  --   --  1.3*  --   --   --   --   PHOS  --   --  2.1*  --   --   --   --     GFR: Estimated Creatinine Clearance: 51.4 mL/min (Jerusalen Mateja) (by C-G formula based on SCr of 1.53 mg/dL (H)).  Liver Function Tests: Recent Labs  Lab 06/18/20 1353 06/19/20 0431 06/20/20 0221 06/21/20 0248  AST 145* 110* 79* 54*  ALT 65* 54* 43 35  ALKPHOS 199* 192* 139* 114  BILITOT 4.1* 4.1* 2.4* 1.1  PROT 5.9* 5.3* 5.1* 5.1*  ALBUMIN 2.5* 2.1* 2.1* 2.2*    CBG: Recent Labs  Lab 06/22/20 0808 06/22/20 1244 06/22/20 1652 06/22/20 2000 06/23/20 0805  GLUCAP 105* 91 95 78 77     Recent Results (from the past 240 hour(s))  Culture, blood (routine x  2)     Status: Abnormal   Collection Time: 06/18/20  4:24 PM   Specimen: BLOOD  Result Value  Ref Range Status   Specimen Description   Final    BLOOD RIGHT ANTECUBITAL Performed at [facility] [address], [place], [place] [address]    Special Requests   Final    BOTTLES DRAWN AEROBIC AND ANAEROBIC Blood Culture adequate volume Performed at [facility] [address] [place], [place] [address]    Culture  Setup Time   Final    GRAM NEGATIVE RODS IN BOTH AEROBIC AND ANAEROBIC BOTTLES CRITICAL VALUE NOTED.  VALUE IS CONSISTENT WITH PREVIOUSLY REPORTED AND CALLED VALUE.    Culture (Nolita Kutter)  Final    KLEBSIELLA OXYTOCA SUSCEPTIBILITIES PERFORMED ON PREVIOUS CULTURE WITHIN THE LAST 5 DAYS. Performed at [facility], [place] [address], [place], [place] [address]    Report Status 06/21/2020 FINAL  Final  Culture, blood (routine x 2)     Status: Abnormal   Collection Time: 06/18/20  4:34 PM   Specimen: BLOOD LEFT FOREARM  Result Value Ref Range Status   Specimen Description   Final    BLOOD LEFT FOREARM Performed at [facility] [address] [place], [place] [address]    Special Requests   Final    BOTTLES DRAWN AEROBIC AND ANAEROBIC Blood Culture adequate volume Performed at [facility] [address] [place], [place] [address]    Culture  Setup Time   Final    GRAM NEGATIVE RODS IN BOTH AEROBIC AND ANAEROBIC BOTTLES CRITICAL RESULT CALLED TO, READ BACK BY AND VERIFIED WITH: PHARMD D.WOFFORD AT 1051 ON 04/21/20 BY KJ Performed at [facility], [place] [address], [place], [place] [address]    Culture KLEBSIELLA OXYTOCA (Erol Flanagin)  Final   Report Status 06/21/2020 FINAL  Final   Organism ID, Bacteria KLEBSIELLA OXYTOCA  Final      Susceptibility   Klebsiella oxytoca - MIC*    AMPICILLIN >=32 RESISTANT Resistant     CEFAZOLIN 16 SENSITIVE Sensitive     CEFEPIME <=0.12 SENSITIVE Sensitive     CEFTAZIDIME <=1 SENSITIVE Sensitive     CEFTRIAXONE <=0.25 SENSITIVE Sensitive     CIPROFLOXACIN  <=0.25 SENSITIVE Sensitive     GENTAMICIN <=1 SENSITIVE Sensitive     IMIPENEM <=0.25 SENSITIVE Sensitive     TRIMETH/SULFA <=20 SENSITIVE Sensitive     AMPICILLIN/SULBACTAM 8 SENSITIVE Sensitive     PIP/TAZO <=4 SENSITIVE Sensitive     * KLEBSIELLA OXYTOCA  Blood Culture ID Panel (Reflexed)     Status: Abnormal   Collection Time: 06/18/20  4:34 PM  Result Value Ref Range Status   Enterococcus faecalis NOT DETECTED NOT DETECTED Final   Enterococcus Faecium NOT DETECTED NOT DETECTED Final   Listeria monocytogenes NOT DETECTED NOT DETECTED Final   Staphylococcus species NOT DETECTED NOT DETECTED Final   Staphylococcus aureus (BCID) NOT DETECTED NOT DETECTED Final   Staphylococcus epidermidis NOT DETECTED NOT DETECTED Final   Staphylococcus lugdunensis NOT DETECTED NOT DETECTED Final   Streptococcus species NOT DETECTED NOT DETECTED Final   Streptococcus agalactiae NOT DETECTED NOT DETECTED Final   Streptococcus pneumoniae NOT DETECTED NOT DETECTED Final   Streptococcus pyogenes NOT DETECTED NOT DETECTED Final   Costantino Kohlbeck.calcoaceticus-baumannii NOT DETECTED NOT DETECTED Final   Bacteroides fragilis NOT DETECTED NOT DETECTED Final   Enterobacterales DETECTED (Shelitha Magley) NOT DETECTED Final    Comment: Enterobacterales represent Kristina Mcnorton large  order of gram negative bacteria, not Gil Ingwersen single organism. CRITICAL RESULT CALLED TO, READ BACK BY AND VERIFIED WITH: PHARMD D.WOFFORD ON 06/19/20 AT 1051 BY KJ    Enterobacter cloacae complex NOT DETECTED NOT DETECTED Final   Escherichia coli NOT DETECTED NOT DETECTED Final   Klebsiella aerogenes NOT DETECTED NOT DETECTED Final   Klebsiella oxytoca DETECTED (Pranathi Winfree) NOT DETECTED Final    Comment: CRITICAL RESULT CALLED TO, READ BACK BY AND VERIFIED WITH: PHARMD D.WOFFORD AT 1051 ON 04/21/20 [place] KJ    Klebsiella pneumoniae NOT DETECTED NOT DETECTED Final   Proteus species NOT DETECTED NOT DETECTED Final   Salmonella species NOT DETECTED NOT DETECTED Final   Serratia  marcescens NOT DETECTED NOT DETECTED Final   Haemophilus influenzae NOT DETECTED NOT DETECTED Final   Neisseria meningitidis NOT DETECTED NOT DETECTED Final   Pseudomonas aeruginosa NOT DETECTED NOT DETECTED Final   Stenotrophomonas maltophilia NOT DETECTED NOT DETECTED Final   Candida albicans NOT DETECTED NOT DETECTED Final   Candida auris NOT DETECTED NOT DETECTED Final   Candida glabrata NOT DETECTED NOT DETECTED Final   Candida krusei NOT DETECTED NOT DETECTED Final   Candida parapsilosis NOT DETECTED NOT DETECTED Final   Candida tropicalis NOT DETECTED NOT DETECTED Final   Cryptococcus neoformans/gattii NOT DETECTED NOT DETECTED Final   CTX-M ESBL NOT DETECTED NOT DETECTED Final   Carbapenem resistance IMP NOT DETECTED NOT DETECTED Final   Carbapenem resistance KPC NOT DETECTED NOT DETECTED Final   Carbapenem resistance NDM NOT DETECTED NOT DETECTED Final   Carbapenem resist OXA 48 LIKE NOT DETECTED NOT DETECTED Final   Carbapenem resistance VIM NOT DETECTED NOT DETECTED Final    Comment: Performed at [facility], [address], [place], [place] [address]  Resp Panel by RT-PCR (Flu Kellie Murrill&B, Covid) Nasopharyngeal Swab     Status: None   Collection Time: 06/18/20  5:55 PM   Specimen: Nasopharyngeal Swab; Nasopharyngeal(NP) swabs in vial transport medium  Result Value Ref Range Status   SARS Coronavirus 2 by RT PCR NEGATIVE NEGATIVE Final    Comment: (NOTE) SARS-CoV-2 target nucleic acids are NOT DETECTED.  The SARS-CoV-2 RNA is generally detectable in upper respiratory specimens during the acute phase of infection. The lowest concentration of SARS-CoV-2 viral copies this assay can detect is 138 copies/mL. Matei Magnone negative result does not preclude SARS-Cov-2 infection and should not be used as the sole basis for treatment or other patient management decisions. Mavric Cortright negative result may occur with  improper specimen collection/handling, submission of specimen other than nasopharyngeal  swab, presence of viral mutation(s) within the areas targeted by this assay, and inadequate number of viral copies(<138 copies/mL). Eliasar Hlavaty negative result must be combined with clinical observations, patient history, and epidemiological information. The expected result is Negative.  Fact Sheet for Patients:  EntrepreneurPulse.com.au  Fact Sheet for Healthcare Providers:  IncredibleEmployment.be  This test is no t yet approved or cleared by the [place] FDA and  has been authorized for detection and/or diagnosis of SARS-CoV-2 by FDA under an Emergency Use Authorization (EUA). This EUA will remain  in effect (meaning this test can be used) for the duration of the COVID-19 declaration under Section 564(b)(1) of the Act, 21 U.S.C.section 360bbb-3(b)(1), unless the authorization is terminated  or revoked sooner.       Influenza Mako Pelfrey by PCR NEGATIVE NEGATIVE Final   Influenza B by PCR NEGATIVE NEGATIVE Final    Comment: (NOTE) The Xpert Xpress SARS-CoV-2/FLU/RSV plus assay is intended as an  aid in the diagnosis of influenza from Nasopharyngeal swab specimens and should not be used as Aerianna Losey sole basis for treatment. Nasal washings and aspirates are unacceptable for Xpert Xpress SARS-CoV-2/FLU/RSV testing.  Fact Sheet for Patients: EntrepreneurPulse.com.au  Fact Sheet for Healthcare Providers: IncredibleEmployment.be  This test is not yet approved or cleared by the [place] FDA and has been authorized for detection and/or diagnosis of SARS-CoV-2 by FDA under an Emergency Use Authorization (EUA). This EUA will remain in effect (meaning this test can be used) for the duration of the COVID-19 declaration under Section 564(b)(1) of the Act, 21 U.S.C. section 360bbb-3(b)(1), unless the authorization is terminated or revoked.  Performed at [facility], [place] [address] [place], [place] [address]    MRSA PCR Screening     Status: None   Collection Time: 06/19/20  4:54 AM   Specimen: Nasopharyngeal  Result Value Ref Range Status   MRSA by PCR NEGATIVE NEGATIVE Final    Comment:        The GeneXpert MRSA Assay (FDA approved for NASAL specimens only), is one component of [facility] [place] comprehensive MRSA colonization surveillance program. It is not intended to diagnose MRSA infection nor to guide or monitor treatment for MRSA infections. Performed at [facility], [place] [address] [place], [place] [address]          Radiology Studies: No results found.      Scheduled Meds: . apixaban  5 mg Oral BID  . chlorhexidine  15 mL Mouth Rinse BID  . Chlorhexidine Gluconate Cloth  6 each Topical Daily  . donepezil  10 mg Oral Daily  . fluticasone furoate-vilanterol  1 puff Inhalation Daily  . gabapentin  300 mg Oral TID WC & HS  . mouth rinse  15 mL Mouth Rinse q12n4p  . pantoprazole  40 mg Oral Daily  . roflumilast  250 mcg Oral Daily  . tamsulosin  0.4 mg Oral Daily  . umeclidinium bromide  1 puff Inhalation Daily  . cyanocobalamin  1,000 mcg Oral Daily   Continuous Infusions: . sodium chloride 10 mL/hr at 06/20/20 0757  . sodium chloride 20 mL/hr at 06/21/20 1818  . cefTRIAXone (ROCEPHIN)  IV 2 g (06/23/20 0002)  . sodium chloride       LOS: 5 days    Time spent: over 30 min    Fayrene Helper, MD [facility]   To contact the attending provider between 7A-7P or the covering provider during after hours 7P-7A, please log into the web site [email or web address] and access using universal [place] password for that web site. If you do not have the password, please call the hospital operator.  06/23/2020, 9:02 AM

## 2020-06-23 NOTE — Progress Notes (Signed)
Notified Dr. Florene Glen of the result of R arm doppler:  R arm negative DVT but with acute superficial vein thrombosis involving the right basilic vein.

## 2020-06-23 NOTE — Progress Notes (Addendum)
Progress Note  Patient Name: Warren [name] Date of Encounter: 06/23/2020  Primary Cardiologist: Mertie Moores, MD  Subjective   Still a little sore in the abdomen but no chest pain or SOB. Converted to NSR overnight per telemetry review.  Inpatient Medications    Scheduled Meds: . apixaban  5 mg Oral BID  . chlorhexidine  15 mL Mouth Rinse BID  . Chlorhexidine Gluconate Cloth  6 each Topical Daily  . donepezil  10 mg Oral Daily  . fluticasone furoate-vilanterol  1 puff Inhalation Daily  . gabapentin  300 mg Oral TID WC & HS  . mouth rinse  15 mL Mouth Rinse q12n4p  . pantoprazole  40 mg Oral Daily  . roflumilast  250 mcg Oral Daily  . tamsulosin  0.4 mg Oral Daily  . umeclidinium bromide  1 puff Inhalation Daily  . cyanocobalamin  1,000 mcg Oral Daily   Continuous Infusions: . sodium chloride 10 mL/hr at 06/20/20 0757  . sodium chloride 20 mL/hr at 06/21/20 1818  . cefTRIAXone (ROCEPHIN)  IV 2 g (06/23/20 0002)  . sodium chloride     PRN Meds: albuterol, clonazePAM, fluticasone, HYDROcodone-acetaminophen, ondansetron **OR** ondansetron (ZOFRAN) IV, sodium chloride   Vital Signs    Vitals:   06/22/20 2200 06/23/20 0218 06/23/20 0500 06/23/20 0705  BP: 127/76 127/63 116/68   Pulse: 63 68 65   Resp: 18 18 20    Temp: 98.1 F (36.7 C) (!) 97.5 F (36.4 C) 98.5 F (36.9 C)   TempSrc: Oral Oral Oral   SpO2: (!) 89% 92% 94% 96%  Weight:   92 kg   Height:        Intake/Output Summary (Last 24 hours) at 06/23/2020 0826 Last data filed at 06/23/2020 0500 Gross per 24 hour  Intake 440 ml  Output 1525 ml  Net -1085 ml   Last 3 Weights 06/23/2020 06/22/2020 06/21/2020  Weight (lbs) 202 lb 14.4 oz 207 lb 10.8 oz 202 lb 6.1 oz  Weight (kg) 92.035 kg 94.2 kg 91.8 kg     Telemetry    NSR - Personally Reviewed  Physical Exam   GEN: No acute distress.  HEENT: Normocephalic, atraumatic, sclera non-icteric. Neck: No JVD or bruits. Cardiac: RRR no murmurs, rubs, or  gallops.  Respiratory: Faint wheezing at bases, otherwise no rhonchi or crackles. Breathing is unlabored. GI: Soft, non-distended, BS +x 4. MS: no deformity. Extremities: No clubbing or cyanosis. No edema. Distal pedal pulses are 2+ and equal bilaterally. Neuro:  AAOx3. Follows commands. Psych:  Responds to questions appropriately with a normal affect.  Labs    High Sensitivity Troponin:   Recent Labs  Lab 06/19/20 0727 06/19/20 0935  TROPONINIHS 1,024* 1,169*      Cardiac EnzymesNo results for input(s): TROPONINI in the last 168 hours. No results for input(s): TROPIPOC in the last 168 hours.   Chemistry Recent Labs  Lab 06/19/20 0431 06/20/20 0221 06/21/20 0248 06/22/20 0232 06/23/20 0406  NA 134* 128* 129*  --  132*  K 3.5 3.8 3.6  --  3.4*  CL 100 95* 94*  --  99  CO2 22 22 19*  --  25  GLUCOSE 60* 137* 134*  --  86  BUN 31* 33* 39*  --  32*  CREATININE 1.65* 1.76* 2.33* 2.28* 1.53*  CALCIUM 8.3* 8.0* 8.2*  --  8.4*  PROT 5.3* 5.1* 5.1*  --   --   ALBUMIN 2.1* 2.1* 2.2*  --   --  AST 110* 79* 54*  --   --   ALT 54* 43 35  --   --   ALKPHOS 192* 139* 114  --   --   BILITOT 4.1* 2.4* 1.1  --   --   GFRNONAA 44* 41* 29* 30* 48*  ANIONGAP 12 11 16*  --  8     Hematology Recent Labs  Lab 06/21/20 0248 06/22/20 0232 06/23/20 0406  WBC 7.8 5.5 6.0  RBC 2.92* 2.79* 3.02*  HGB 9.5* 9.1* 9.9*  HCT 28.6* 27.9* 30.1*  MCV 97.9 100.0 99.7  MCH 32.5 32.6 32.8  MCHC 33.2 32.6 32.9  RDW 15.2 15.4 15.3  PLT 81* 96* 145*    BNP Recent Labs  Lab 06/19/20 0727  BNP 1,303.5*     DDimer  Recent Labs  Lab 06/19/20 0727  DDIMER 4.28*     Radiology    No results found.  Cardiac Studies   1. Left ventricular ejection fraction, by estimation, is 50 to 55%. The  left ventricle has low normal function. The left ventricle has no regional  wall motion abnormalities. There is mild left ventricular hypertrophy.  Left ventricular diastolic  parameters are  indeterminate.  2. Right ventricular systolic function is normal. The right ventricular  size is normal. There is mildly elevated pulmonary artery systolic  pressure. The estimated right ventricular systolic pressure is 66.4 mmHg.  3. Left atrial size was mildly dilated.  4. Right atrial size was mildly dilated.  5. The mitral valve is normal in structure. Mild mitral valve  regurgitation.  6. The aortic valve is tricuspid. There is moderate calcification of the  aortic valve. Aortic valve regurgitation is not visualized. Mild to  moderate aortic valve stenosis. Vmax 2.6 m/s, MG 19 mmHg, AVA 1.5 cm^2, DI  0.47  7. The inferior vena cava is dilated in size with >50% respiratory  variability, suggesting right atrial pressure of 8 mmHg.   Patient Profile     70 y.o. male with history of PE 2018 (not on anticoag prior to admission), COPD on nocturnal O2 with chronic DOE, HTN, OSA on CPAP, reported prior RV enlargement on echo, CKD III, admitted 06/18/2020 with 2.5 weeks of abodminal pain. He was found to have ascending cholangitis with Klebsiella bacteremia, choledocholithiasis, septic shock and AKI. Underwent ERCP with sphincterotomy 4/9 with clinical improvement. Cardiology following for development of atrial fib during admission (initial EKG NSR).  Assessment & Plan    1. Ascending cholangitis and choledocholithiasis - s/p ERCP 4/9 - GI has signed off, followed by PCCM - other medical issues include hyponatremia (improving), anemia/thrombocytopenia (improving), AKI (improving), hypoalbuminemia, COPD, transaminitis, abnormal thyroid studies (TSH suppressed at 0.121, free T4 elevated, total T3 decreased - further management per internal medicine team given association with hyperthyroid/PAF)  2. Paroxysmal atrial fib - CHADSVASC 3 for HTN, age, vascular disease, transitioned to apixaban post-operatively and tolerating OK - amiodarone discontinued 4/9 - not currently requiring any rate  controlling agents - converted to NSR overnight and maintaining NSR - continue to watch Hgb, platelets -> slightly improved today, further management per primary team  3. Elevated troponin - 06/19/20: hsTroponin 1024->1169 - 2D echo with normal LVEF - denies chest pain but has had a history of left shoulder pain (unclear relationship to #1) - consider statin once once liver function normalizes - per discussion with Dr. Marlou Porch, once he is over his current illness, consider further ischemic evaluation in the outpatient setting (Lexiscan stress test - or  potentially coronary CTA if able to achieve/maintain restoration of NSR)  4. Reported prior h/o RV enlargement per consult note - 2D echo 06/19/20 with normal RV - mildly elevated PASP may be sequelae of chronic lung issues   5. Mild MR, mild-moderate AS by echo 06/19/20 - of no clinical significance to this admission, but will continue to follow as OP  6. PAD  - per CT report, patient had R CFA, bilateral renal artery and celiac disease - can follow as OP after recovery  7. Hypokalemia - will defer electrolyte management to primary team  I have tentatively arranged f/u in our office 5/9. He would benefit from close OP f/u primary care before then as well.  For questions or updates, please contact [facility] Please consult [email or web address] for contact info under Cardiology/STEMI.  Signed, Charlie Pitter, PA-C 06/23/2020, 8:26 AM    Personally seen and examined. Agree with above.   70 year old with ascending cholangitis, paroxysmal atrial fibrillation, elevated troponin.  Comfortable in bed alert and oriented regular rate and rhythm lungs are clear  Labs reviewed as above.  Paroxysmal atrial fibrillation -Converted to sinus rhythm.  Maintaining.  Doing well. -He is off of amiodarone, discontinued on 06/20/2020.  No rate controlling agents necessary.  Ascending cholangitis -GI has signed off.  Progressing well.  Elevated  troponin -1100 fairly flat.  I think would make sense for [place] in the outpatient setting to consider Lexiscan stress test.  Candee Furbish, MD

## 2020-06-24 ENCOUNTER — Encounter (HOSPITAL_COMMUNITY): Payer: Self-pay | Admitting: Family Medicine

## 2020-06-24 DIAGNOSIS — Z8719 Personal history of other diseases of the digestive system: Secondary | ICD-10-CM

## 2020-06-24 DIAGNOSIS — I4891 Unspecified atrial fibrillation: Secondary | ICD-10-CM | POA: Diagnosis not present

## 2020-06-24 DIAGNOSIS — R7989 Other specified abnormal findings of blood chemistry: Secondary | ICD-10-CM | POA: Diagnosis not present

## 2020-06-24 DIAGNOSIS — K8309 Other cholangitis: Secondary | ICD-10-CM | POA: Diagnosis not present

## 2020-06-24 LAB — COMPREHENSIVE METABOLIC PANEL
ALT: 22 U/L (ref 0–44)
AST: 20 U/L (ref 15–41)
Albumin: 2.2 g/dL — ABNORMAL LOW (ref 3.5–5.0)
Alkaline Phosphatase: 124 U/L (ref 38–126)
Anion gap: 10 (ref 5–15)
BUN: 19 mg/dL (ref 8–23)
CO2: 23 mmol/L (ref 22–32)
Calcium: 8.3 mg/dL — ABNORMAL LOW (ref 8.9–10.3)
Chloride: 100 mmol/L (ref 98–111)
Creatinine, Ser: 1.07 mg/dL (ref 0.61–1.24)
GFR, Estimated: >60 mL/min (ref >60)
Glucose, Bld: 131 mg/dL — ABNORMAL HIGH (ref 70–99)
Potassium: 3.5 mmol/L (ref 3.5–5.1)
Sodium: 133 mmol/L — ABNORMAL LOW (ref 135–145)
Total Bilirubin: 1 mg/dL (ref 0.3–1.2)
Total Protein: 5.1 g/dL — ABNORMAL LOW (ref 6.5–8.1)

## 2020-06-24 LAB — CBC WITH DIFFERENTIAL/PLATELET
Abs Immature Granulocytes: 0.22 10*3/uL — ABNORMAL HIGH (ref 0.00–0.07)
Basophils Absolute: 0 10*3/uL (ref 0.0–0.1)
Basophils Relative: 1 %
Eosinophils Absolute: 0.3 10*3/uL (ref 0.0–0.5)
Eosinophils Relative: 5 %
HCT: 30.3 % — ABNORMAL LOW (ref 39.0–52.0)
Hemoglobin: 9.5 g/dL — ABNORMAL LOW (ref 13.0–17.0)
Immature Granulocytes: 4 %
Lymphocytes Relative: 21 %
Lymphs Abs: 1.3 10*3/uL (ref 0.7–4.0)
MCH: 32.5 pg (ref 26.0–34.0)
MCHC: 31.4 g/dL (ref 30.0–36.0)
MCV: 103.8 fL — ABNORMAL HIGH (ref 80.0–100.0)
Monocytes Absolute: 0.8 10*3/uL (ref 0.1–1.0)
Monocytes Relative: 13 %
Neutro Abs: 3.6 10*3/uL (ref 1.7–7.7)
Neutrophils Relative %: 56 %
Platelets: 208 10*3/uL (ref 150–400)
RBC: 2.92 MIL/uL — ABNORMAL LOW (ref 4.22–5.81)
RDW: 15.6 % — ABNORMAL HIGH (ref 11.5–15.5)
WBC: 6.2 10*3/uL (ref 4.0–10.5)
nRBC: 0 % (ref 0.0–0.2)

## 2020-06-24 LAB — GLUCOSE, CAPILLARY
Glucose-Capillary: 111 mg/dL — ABNORMAL HIGH (ref 70–99)
Glucose-Capillary: 131 mg/dL — ABNORMAL HIGH (ref 70–99)
Glucose-Capillary: 138 mg/dL — ABNORMAL HIGH (ref 70–99)
Glucose-Capillary: 170 mg/dL — ABNORMAL HIGH (ref 70–99)
Glucose-Capillary: 187 mg/dL — ABNORMAL HIGH (ref 70–99)
Glucose-Capillary: 98 mg/dL (ref 70–99)

## 2020-06-24 LAB — PHOSPHORUS: Phosphorus: 2.1 mg/dL — ABNORMAL LOW (ref 2.5–4.6)

## 2020-06-24 LAB — MAGNESIUM: Magnesium: 1.5 mg/dL — ABNORMAL LOW (ref 1.7–2.4)

## 2020-06-24 MED ORDER — POTASSIUM CHLORIDE CRYS ER 20 MEQ PO TBCR
40.0000 meq | EXTENDED_RELEASE_TABLET | Freq: Once | ORAL | Status: AC
  Administered 2020-06-24: 40 meq via ORAL
  Filled 2020-06-24: qty 2

## 2020-06-24 MED ORDER — MAGNESIUM SULFATE 2 GM/50ML IV SOLN
2.0000 g | Freq: Once | INTRAVENOUS | Status: AC
  Administered 2020-06-24: 2 g via INTRAVENOUS
  Filled 2020-06-24: qty 50

## 2020-06-24 MED ORDER — MAGNESIUM OXIDE 400 (241.3 MG) MG PO TABS
400.0000 mg | ORAL_TABLET | Freq: Two times a day (BID) | ORAL | Status: DC
  Administered 2020-06-24 – 2020-06-25 (×3): 400 mg via ORAL
  Filled 2020-06-24 (×3): qty 1

## 2020-06-24 MED ORDER — K PHOS MONO-SOD PHOS DI & MONO 155-852-130 MG PO TABS
500.0000 mg | ORAL_TABLET | Freq: Three times a day (TID) | ORAL | Status: AC
  Administered 2020-06-24 (×3): 500 mg via ORAL
  Filled 2020-06-24 (×3): qty 2

## 2020-06-24 NOTE — Progress Notes (Signed)
PROGRESS NOTE  Warren [name] OEU:[id number] DOB: 11-14-1950 DOA: 06/18/2020 PCP: Imagene Riches, NP   LOS: 6 days   Brief narrative: Warren [name] a 70 y.o.malewith medical history significant ofCOPD on 3 L oxygen at night, GERD, HTN, pancreatitis, history of pulmonary embolism, s/p cholecystectomy presented to hospital with abdominal pain for 2 and half weeks.  Patient was noted to have cholangitis.  There was mention of 6 mm stone at the ampulla with significant CBD and main pancreatic duct dilatation.  He was initially in ICU for hypotension and atrial fibrillation with RVR.  He underwent ERCP on 4 9 with a limited biliary sphincterectomy.  Blood cultures showed Klebsiella oxytocin bacteremia and was continued on IV antibiotics.  Patient was then transferred out of the ICU under medical service.  Assessment/Plan:  Principal Problem:   Choledocholithiasis with obstruction Active Problems:   GERD (gastroesophageal reflux disease)   S/P cholecystectomy   History of pancreatitis   History of pulmonary embolism   COPD (chronic obstructive pulmonary disease) (HCC)   Atrial fibrillation with rapid ventricular response (HCC)   Septic shock (HCC)  Septic Shock secondary to Cholangitis  Klebsiella Bacteremia  Cholodocholithiasis  Abdominal Pain CT scan of the abdomen and pelvis showed a 6 mm stone at the ampulla with significant biliary and pancreatic duct dilatation.  Patient underwent ERCP on 4/9 with periampullary duodenal diverticulum, minimally dilated CBD without stones.  Limited biliary sphincterotomy, was done and duct was swept.  Stone was suspected to have passed prior to ERCP.  Blood culture from 4 7 showed Klebsiella oxytoca resistant to ampicillin but is sensitive to cephalosporin.  Patient will complete a 7-day course of antibiotic today.  GI has signed off at this time.  Patient has been started on oral anticoagulation.  Atrial Fibrillation with RVR CHA2DS2-VASc score of  3.  On Eliquis.  Amiodarone was discontinued.  Bilateral LE Edema 2D echo with normal ejection fraction.  We will continue to monitor.  Elevated Troponin  Demand Ischemia vs NSTEMI Patient underwent 2D Echo 4/8 with no regional wall motion abnormality.  LV function was preserved.  Cardiology recommended outpatient ischemic evaluation with stress test after discharge.  Will consider statins prior to discharge since LFTs has improved.  Right upper extremity pain secondary to superficial thrombosis.  Supportive care.  Acute Kidney Injury  creatinine of 1.0 today.  Was 2.0 on presentation.  Has improved.  Hyponatremia Mild.  Latest sodium of 133.  Mild hypomagnesium , hypophosphatemia.  Will replace with Neutra-Phos and IV magnesium sulfate.  Add magnesium oxide and p.o. KCl as well.  Check levels in a.m.  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  CT scan without effusion but emphysema and bronchial wall thickening.  Continue  daliresp, trelegy ellipta on discharge.  Continue inhalers while in hospital.  Currently on Rocephin and oxygen.  History of pulmonary embolism. Patient was on anticoagulation for 6 months but has been started back on Eliquis this time for A. fib  BPH Continue flomax  Anxiety Continue klonopin nightly prn  GERD Continue PPI  Chronic back pain Had a long conversation about it.  He had gone to spine specialist and had local steroid injection with out much relief.  Was on Norco at home.  Oxycodone here has helped the patient.  Received oxycodone on discharge.  I have advised him to discuss this with his primary care  physician/spine specialist for chronic pain management.  Continue gabapentin.  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  Ambulatory dysfunction back pain.  PT has been consulted.  Will follow recommendation  DVT prophylaxis:  apixaban (ELIQUIS) tablet 5 mg    Code Status: Full code  Family Communication: None today  Status is: Inpatient  Remains inpatient appropriate because:IV treatments appropriate due to intensity of illness or inability to take PO and Inpatient level of care appropriate due to severity of illness   Dispo: The patient is from: Home              Anticipated d/c is to: Home with home health tomorrow              Patient currently is not medically stable to d/c.   Difficult to place patient No  Consultants:  GI  Cardiology  PCCM  Procedures:  ERCP on 06/20/2020  Anti-infectives:  Marland Kitchen Doxycycline and Rocephin   Anti-infectives (From admission, onward)   Start     Dose/Rate Route Frequency Ordered Stop   06/23/20 2200  doxycycline (VIBRA-TABS) tablet 100 mg        100 mg Oral Every 12 hours 06/23/20 1944     06/20/20 2200  cefTRIAXone (ROCEPHIN) 2 g in sodium chloride 0.9 % 100 mL IVPB        2 g 200 mL/hr over 30 Minutes Intravenous Every 24 hours 06/20/20 1116     06/19/20 0000  piperacillin-tazobactam (ZOSYN) IVPB 3.375 g  Status:  Discontinued        3.375 g 12.5 mL/hr over 240 Minutes Intravenous Every 8 hours 06/18/20 1720 06/20/20 1116   06/18/20 1615  vancomycin (VANCOREADY) IVPB 1500 mg/300 mL        1,500 mg 150 mL/hr over 120 Minutes Intravenous  Once 06/18/20 1600 06/18/20 2004   06/18/20 1600  ceFEPIme (MAXIPIME) 2 g in sodium chloride 0.9 % 100 mL IVPB        2 g 200 mL/hr over 30 Minutes Intravenous  Once 06/18/20 1552 06/18/20 1737   06/18/20 1600  metroNIDAZOLE (FLAGYL) IVPB 500 mg        500 mg 100 mL/hr over 60 Minutes Intravenous  Once 06/18/20 1552 06/18/20 1737   06/18/20 1600  vancomycin (VANCOCIN) IVPB 1000 mg/200 mL premix  Status:  Discontinued        1,000 mg 200 mL/hr over 60 Minutes Intravenous  Once  06/18/20 1552 06/18/20 1600      Subjective: Today, patient was seen and examined at bedside.  Complains of back pain slightly better with oxycodone 10 Norco at home.  Has had cortisone shots in the back twice.  Has any nausea vomiting fever or chills.  Has mild cough and dyspnea at baseline  Objective: Vitals:   06/24/20 0811 06/24/20 0814  BP:    Pulse:    Resp:    Temp:    SpO2: 90% 90%    Intake/Output Summary (Last 24 hours) at 06/24/2020 1342 Last data filed at 06/24/2020 1304 Gross per 24 hour  Intake 790 ml  Output 400 ml  Net 390 ml   Filed Weights   06/23/20 0500 06/24/20 0430 06/24/20 1107  Weight: 92 kg 93.9 kg 94.6 kg   Body mass index is 29.09 kg/m.   Physical Exam: GENERAL: Patient is alert awake and oriented. Not in obvious  distress.  On nasal cannula oxygen. HENT: No scleral pallor or icterus. Pupils equally reactive to light. Oral mucosa is moist NECK: is supple, no gross swelling noted. CHEST: Coarse breath sounds noted bilaterally, no overt wheezing CVS: S1 and S2 heard, no murmur. Regular rate and rhythm.  ABDOMEN: Soft, non-tender, bowel sounds are present. EXTREMITIES: No edema.  Right upper extremity mild edema and tenderness. CNS: Cranial nerves are intact. No focal motor deficits. SKIN: warm and dry without rashes.  Data Review: I have personally reviewed the following laboratory data and studies,  CBC: Recent Labs  Lab 06/18/20 1353 06/18/20 1407 06/19/20 0431 06/20/20 0221 06/21/20 0248 06/22/20 0232 06/23/20 0406 06/24/20 0506  WBC 13.1*  --  10.3 11.0* 7.8 5.5 6.0 6.2  NEUTROABS 12.4*  --  9.4*  --   --   --   --  3.6  HGB 11.0*   < > 9.5* 9.6* 9.5* 9.1* 9.9* 9.5*  HCT 33.7*   < > 29.5* 29.6* 28.6* 27.9* 30.1* 30.3*  MCV 101.8*  --  102.1* 101.4* 97.9 100.0 99.7 103.8*  PLT 163  --  118* 93* 81* 96* 145* 208   < > = values in this interval not displayed.   Basic Metabolic Panel: Recent Labs  Lab 06/19/20 0431 06/20/20 0221  06/21/20 0248 06/22/20 0232 06/23/20 0406 06/24/20 0506  NA 134* 128* 129*  --  132* 133*  K 3.5 3.8 3.6  --  3.4* 3.5  CL 100 95* 94*  --  99 100  CO2 22 22 19*  --  25 23  GLUCOSE 60* 137* 134*  --  86 131*  BUN 31* 33* 39*  --  32* 19  CREATININE 1.65* 1.76* 2.33* 2.28* 1.53* 1.07  CALCIUM 8.3* 8.0* 8.2*  --  8.4* 8.3*  MG 1.3*  --   --   --   --  1.5*  PHOS 2.1*  --   --   --   --  2.1*   Liver Function Tests: Recent Labs  Lab 06/19/20 0431 06/20/20 0221 06/21/20 0248 06/23/20 0406 06/24/20 0506  AST 110* 79* 54* 27 20  ALT 54* 43 35 24 22  ALKPHOS 192* 139* 114 121 124  BILITOT 4.1* 2.4* 1.1 1.1 1.0  PROT 5.3* 5.1* 5.1* 5.0* 5.1*  ALBUMIN 2.1* 2.1* 2.2* 2.0* 2.2*   Recent Labs  Lab 06/18/20 1353 06/19/20 0431 06/21/20 0248 06/23/20 0406  LIPASE 37 40 69* 81*   No results for input(s): AMMONIA in the last 168 hours. Cardiac Enzymes: No results for input(s): CKTOTAL, CKMB, CKMBINDEX, TROPONINI in the last 168 hours. BNP (last 3 results) Recent Labs    06/19/20 0727 06/23/20 0406  BNP 1,303.5* 1,505.3*    ProBNP (last 3 results) No results for input(s): PROBNP in the last 8760 hours.  CBG: Recent Labs  Lab 06/23/20 2022 06/24/20 0024 06/24/20 0427 06/24/20 0734 06/24/20 1148  GLUCAP 131* 98 138* 111* 131*   Recent Results (from the past 240 hour(s))  Culture, blood (routine x 2)     Status: Abnormal   Collection Time: 06/18/20  4:24 PM   Specimen: BLOOD  Result Value Ref Range Status   Specimen Description   Final    BLOOD RIGHT ANTECUBITAL Performed at [facility] [address], [place], [place] [address]    Special Requests   Final    BOTTLES DRAWN AEROBIC AND ANAEROBIC Blood Culture adequate volume Performed at [facility]  [address] [place], [place] [address]    Culture  Setup Time   Final    GRAM NEGATIVE RODS IN BOTH AEROBIC AND ANAEROBIC BOTTLES CRITICAL VALUE NOTED.  VALUE IS  CONSISTENT WITH PREVIOUSLY REPORTED AND CALLED VALUE.    Culture (A)  Final    KLEBSIELLA OXYTOCA SUSCEPTIBILITIES PERFORMED ON PREVIOUS CULTURE WITHIN THE LAST 5 DAYS. Performed at [facility], [place] [address] [place], [place] [address]    Report Status 06/21/2020 FINAL  Final  Culture, blood (routine x 2)     Status: Abnormal   Collection Time: 06/18/20  4:34 PM   Specimen: BLOOD LEFT FOREARM  Result Value Ref Range Status   Specimen Description   Final    BLOOD LEFT FOREARM Performed at [facility] [address], [place], [place] [address]    Special Requests   Final    BOTTLES DRAWN AEROBIC AND ANAEROBIC Blood Culture adequate volume Performed at [facility] [address] [place], [place] [address]    Culture  Setup Time   Final    GRAM NEGATIVE RODS IN BOTH AEROBIC AND ANAEROBIC BOTTLES CRITICAL RESULT CALLED TO, READ BACK BY AND VERIFIED WITH: PHARMD D.WOFFORD AT 1051 ON 04/21/20 BY KJ Performed at [facility], [place] [address] [place], [place] [address]    Culture KLEBSIELLA OXYTOCA (A)  Final   Report Status 06/21/2020 FINAL  Final   Organism ID, Bacteria KLEBSIELLA OXYTOCA  Final      Susceptibility   Klebsiella oxytoca - MIC*    AMPICILLIN >=32 RESISTANT Resistant     CEFAZOLIN 16 SENSITIVE Sensitive     CEFEPIME <=0.12 SENSITIVE Sensitive     CEFTAZIDIME <=1 SENSITIVE Sensitive     CEFTRIAXONE <=0.25 SENSITIVE Sensitive     CIPROFLOXACIN <=0.25 SENSITIVE Sensitive     GENTAMICIN <=1 SENSITIVE Sensitive     IMIPENEM <=0.25 SENSITIVE Sensitive     TRIMETH/SULFA <=20 SENSITIVE Sensitive     AMPICILLIN/SULBACTAM 8 SENSITIVE Sensitive     PIP/TAZO <=4 SENSITIVE Sensitive     * KLEBSIELLA OXYTOCA  Blood Culture ID Panel (Reflexed)     Status: Abnormal   Collection Time: 06/18/20  4:34 PM  Result Value Ref Range Status   Enterococcus faecalis NOT DETECTED NOT DETECTED Final   Enterococcus Faecium NOT DETECTED  NOT DETECTED Final   Listeria monocytogenes NOT DETECTED NOT DETECTED Final   Staphylococcus species NOT DETECTED NOT DETECTED Final   Staphylococcus aureus (BCID) NOT DETECTED NOT DETECTED Final   Staphylococcus epidermidis NOT DETECTED NOT DETECTED Final   Staphylococcus lugdunensis NOT DETECTED NOT DETECTED Final   Streptococcus species NOT DETECTED NOT DETECTED Final   Streptococcus agalactiae NOT DETECTED NOT DETECTED Final   Streptococcus pneumoniae NOT DETECTED NOT DETECTED Final   Streptococcus pyogenes NOT DETECTED NOT DETECTED Final   A.calcoaceticus-baumannii NOT DETECTED NOT DETECTED Final   Bacteroides fragilis NOT DETECTED NOT DETECTED Final   Enterobacterales DETECTED (A) NOT DETECTED Final    Comment: Enterobacterales represent a large order of gram negative bacteria, not a single organism. CRITICAL RESULT CALLED TO, READ BACK BY AND VERIFIED WITH: PHARMD D.WOFFORD ON 06/19/20 AT 1051 BY KJ    Enterobacter cloacae complex NOT DETECTED NOT DETECTED Final   Escherichia coli NOT DETECTED NOT DETECTED Final   Klebsiella aerogenes NOT DETECTED NOT DETECTED Final   Klebsiella oxytoca DETECTED (A) NOT DETECTED Final    Comment: CRITICAL RESULT CALLED TO, READ BACK BY AND VERIFIED WITH:  PHARMD D.WOFFORD AT 6967 ON 04/21/20 [place] KJ    Klebsiella pneumoniae NOT DETECTED NOT DETECTED Final   Proteus species NOT DETECTED NOT DETECTED Final   Salmonella species NOT DETECTED NOT DETECTED Final   Serratia marcescens NOT DETECTED NOT DETECTED Final   Haemophilus influenzae NOT DETECTED NOT DETECTED Final   Neisseria meningitidis NOT DETECTED NOT DETECTED Final   Pseudomonas aeruginosa NOT DETECTED NOT DETECTED Final   Stenotrophomonas maltophilia NOT DETECTED NOT DETECTED Final   Candida albicans NOT DETECTED NOT DETECTED Final   Candida auris NOT DETECTED NOT DETECTED Final   Candida glabrata NOT DETECTED NOT DETECTED Final   Candida krusei NOT DETECTED NOT DETECTED Final    Candida parapsilosis NOT DETECTED NOT DETECTED Final   Candida tropicalis NOT DETECTED NOT DETECTED Final   Cryptococcus neoformans/gattii NOT DETECTED NOT DETECTED Final   CTX-M ESBL NOT DETECTED NOT DETECTED Final   Carbapenem resistance IMP NOT DETECTED NOT DETECTED Final   Carbapenem resistance KPC NOT DETECTED NOT DETECTED Final   Carbapenem resistance NDM NOT DETECTED NOT DETECTED Final   Carbapenem resist OXA 48 LIKE NOT DETECTED NOT DETECTED Final   Carbapenem resistance VIM NOT DETECTED NOT DETECTED Final    Comment: Performed at [facility], [place] [address] [place], [place] [address]  Resp Panel by RT-PCR (Flu A&B, Covid) Nasopharyngeal Swab     Status: None   Collection Time: 06/18/20  5:55 PM   Specimen: Nasopharyngeal Swab; Nasopharyngeal(NP) swabs in vial transport medium  Result Value Ref Range Status   SARS Coronavirus 2 by RT PCR NEGATIVE NEGATIVE Final    Comment: (NOTE) SARS-CoV-2 target nucleic acids are NOT DETECTED.  The SARS-CoV-2 RNA is generally detectable in upper respiratory specimens during the acute phase of infection. The lowest concentration of SARS-CoV-2 viral copies this assay can detect is 138 copies/mL. A negative result does not preclude SARS-Cov-2 infection and should not be used as the sole basis for treatment or other patient management decisions. A negative result may occur with  improper specimen collection/handling, submission of specimen other than nasopharyngeal swab, presence of viral mutation(s) within the areas targeted by this assay, and inadequate number of viral copies(<138 copies/mL). A negative result must be combined with clinical observations, patient history, and epidemiological information. The expected result is Negative.  Fact Sheet for Patients:  EntrepreneurPulse.com.au  Fact Sheet for Healthcare Providers:  IncredibleEmployment.be  This test is no t yet approved or cleared by  the [place] FDA and  has been authorized for detection and/or diagnosis of SARS-CoV-2 by FDA under an Emergency Use Authorization (EUA). This EUA will remain  in effect (meaning this test can be used) for the duration of the COVID-19 declaration under Section 564(b)(1) of the Act, 21 U.S.C.section 360bbb-3(b)(1), unless the authorization is terminated  or revoked sooner.       Influenza A by PCR NEGATIVE NEGATIVE Final   Influenza B by PCR NEGATIVE NEGATIVE Final    Comment: (NOTE) The Xpert Xpress SARS-CoV-2/FLU/RSV plus assay is intended as an aid in the diagnosis of influenza from Nasopharyngeal swab specimens and should not be used as a sole basis for treatment. Nasal washings and aspirates are unacceptable for Xpert Xpress SARS-CoV-2/FLU/RSV testing.  Fact Sheet for Patients: EntrepreneurPulse.com.au  Fact Sheet for Healthcare Providers: IncredibleEmployment.be  This test is not yet approved or cleared by the [place] FDA and has been authorized for detection and/or diagnosis of SARS-CoV-2 by FDA under an Emergency Use Authorization (EUA). This EUA  will remain in effect (meaning this test can be used) for the duration of the COVID-19 declaration under Section 564(b)(1) of the Act, 21 U.S.C. section 360bbb-3(b)(1), unless the authorization is terminated or revoked.  Performed at [facility], [place] [address], [place], [place] [address]   MRSA PCR Screening     Status: None   Collection Time: 06/19/20  4:54 AM   Specimen: Nasopharyngeal  Result Value Ref Range Status   MRSA by PCR NEGATIVE NEGATIVE Final    Comment:        The GeneXpert MRSA Assay (FDA approved for NASAL specimens only), is one component of a comprehensive MRSA colonization surveillance program. It is not intended to diagnose MRSA infection nor to guide or monitor treatment for MRSA infections. Performed at [facility], [place] [address], [place], [place] [address]      Studies: CT HUMERUS RIGHT WO CONTRAST  Result Date: 06/24/2020 CLINICAL DATA:  Right arm swelling and redness after IV placement. EXAM: CT OF THE RIGHT HUMERUS AND FOREARM WITHOUT CONTRAST TECHNIQUE: Multidetector CT imaging was performed according to the standard protocol. Multiplanar CT image reconstructions were also generated. COMPARISON:  None. FINDINGS: Bones/Joint/Cartilage No bony destruction or periosteal reaction. No fracture or dislocation. Mild glenohumeral and acromioclavicular osteoarthritis. Mild elbow osteoarthritis with tiny [name] body in the posterior joint space (series 1, image 24). Mild scapholunate and first [facility] joint degenerative changes. No joint effusion. Ligaments Ligaments are suboptimally evaluated by CT. Muscles and Tendons Grossly intact.  No muscle atrophy. Soft tissue Soft tissue swelling and subcutaneous fat stranding along the medial aspect of the distal upper arm and forearm. No fluid collection or hematoma. No soft tissue mass. Scarring and emphysema in the visualized right lung. IMPRESSION: 1. Soft tissue swelling and subcutaneous fat stranding along the medial aspect of the distal upper arm and forearm is nonspecific and may reflect cellulitis or superficial venous thrombophlebitis. No abscess. 2. No acute osseous abnormality. Mild osteoarthritis of the shoulder, elbow, and wrist. Electronically Signed   By: Titus Dubin M.D.   On: 06/24/2020 07:52   CT FOREARM RIGHT WO CONTRAST  Result Date: 06/24/2020 CLINICAL DATA:  Right arm swelling and redness after IV placement. EXAM: CT OF THE RIGHT HUMERUS AND FOREARM WITHOUT CONTRAST TECHNIQUE: Multidetector CT imaging was performed according to the standard protocol. Multiplanar CT image reconstructions were also generated. COMPARISON:  None. FINDINGS: Bones/Joint/Cartilage No bony destruction or periosteal reaction. No fracture or dislocation. Mild glenohumeral  and acromioclavicular osteoarthritis. Mild elbow osteoarthritis with tiny [name] body in the posterior joint space (series 1, image 24). Mild scapholunate and first [facility] joint degenerative changes. No joint effusion. Ligaments Ligaments are suboptimally evaluated by CT. Muscles and Tendons Grossly intact.  No muscle atrophy. Soft tissue Soft tissue swelling and subcutaneous fat stranding along the medial aspect of the distal upper arm and forearm. No fluid collection or hematoma. No soft tissue mass. Scarring and emphysema in the visualized right lung. IMPRESSION: 1. Soft tissue swelling and subcutaneous fat stranding along the medial aspect of the distal upper arm and forearm is nonspecific and may reflect cellulitis or superficial venous thrombophlebitis. No abscess. 2. No acute osseous abnormality. Mild osteoarthritis of the shoulder, elbow, and wrist. Electronically Signed   By: Titus Dubin M.D.   On: 06/24/2020 07:52   DG CHEST PORT 1 VIEW  Result Date: 06/23/2020 CLINICAL DATA:  Cough and weakness EXAM: PORTABLE CHEST 1 VIEW COMPARISON:  0 portable exam 1017 hours compared  to 11/02/2020 FINDINGS: Marked size and mediastinal contours. Bibasilar opacities similar to previous exam, could represent atelectasis or consolidation. Mild RIGHT apical scarring. Remaining lungs clear. Probable small LEFT pleural effusion. No pneumothorax or acute osseous findings. IMPRESSION: Persistent bibasilar opacities question atelectasis versus infiltrate. Probable small LEFT pleural effusion. Electronically Signed   By: Lavonia Dana M.D.   On: 06/23/2020 11:51   VAS [place] UPPER EXTREMITY VENOUS DUPLEX  Result Date: 06/23/2020 UPPER VENOUS STUDY  Indications: Pain Risk Factors: Trauma. Comparison Study: No prior studies. Performing Technologist: Oliver Hum RVT  Examination Guidelines: A complete evaluation includes B-mode imaging, spectral Doppler, color Doppler, and power Doppler as needed of all accessible portions of  each vessel. Bilateral testing is considered an integral part of a complete examination. Limited examinations for reoccurring indications may be performed as noted.  Right Findings: +----------+------------+---------+-----------+----------+-------+ RIGHT     CompressiblePhasicitySpontaneousPropertiesSummary +----------+------------+---------+-----------+----------+-------+ IJV           Full       Yes       Yes                      +----------+------------+---------+-----------+----------+-------+ Subclavian    Full       Yes       Yes                      +----------+------------+---------+-----------+----------+-------+ Axillary      Full       Yes       Yes                      +----------+------------+---------+-----------+----------+-------+ Brachial      Full       Yes       Yes                      +----------+------------+---------+-----------+----------+-------+ Radial        Full                                          +----------+------------+---------+-----------+----------+-------+ Ulnar         Full                                          +----------+------------+---------+-----------+----------+-------+ Cephalic      Full                                          +----------+------------+---------+-----------+----------+-------+ Basilic       None                                   Acute  +----------+------------+---------+-----------+----------+-------+ Thrombus located in the basilic vein is noted to only be in the [facility].  Left Findings: +----------+------------+---------+-----------+----------+-------+ LEFT      CompressiblePhasicitySpontaneousPropertiesSummary +----------+------------+---------+-----------+----------+-------+ Subclavian    Full       Yes       Yes                      +----------+------------+---------+-----------+----------+-------+  Summary:  Right: No evidence of deep  vein thrombosis in the upper  extremity. Findings consistent with acute superficial vein thrombosis involving the right basilic vein.  Left: No evidence of thrombosis in the subclavian.  *See table(s) above for measurements and observations.  Diagnosing physician: Deitra Mayo MD Electronically signed by Deitra Mayo MD on 06/23/2020 at 1:49:43 PM.    Final       Flora Lipps, MD  [facility] 06/24/2020  If 7PM-7AM, please contact night-coverage

## 2020-06-24 NOTE — Progress Notes (Signed)
Physical Therapy Treatment Patient Details Name: Warren [name] MRN: [id number] DOB: 16-[name]-1951 Today's Date: 06/24/2020    History of Present Illness 70 y.o. male presenting with 2.5 weeks of abdominal pain. Dx of cholangitis, septic shock, hypotension,  s/p ERCP 06/20/20. PMH: COPD on 3 L oxygen at night, GERD, HTN, pancreatitis, history of pulmonary embolism, s/p cholecystectomy    PT Comments    Pt in bed on 2 lts at rest 96% with HR 84.  Assisted OOB to amb in hallway trial RA.  General Gait Details: amb an increased distance >150 feet with walker first 25 feet then + 2 hand held assist the rest to get an accurate pulse Ox reading from L finger.  Pt started at RA 94% at rest but after 100 feet pt with increased  dyspnea and RA decreased to 83%.  Pt Moderately "winded" and unable to hold a full conversation during activity. Reapplied 2 lts to achieve sats > 88%. Pt required an extended rest break > 5 min to recover.  Pt admits since his Hernia operation last year he has been "inactive".  He does amb with a cane as well.   Pt plans to return home but will need oxygen.  SATURATION QUALIFICATIONS: (This note is used to comply with regulatory documentation for home oxygen)  Patient Saturations on Room Air at Rest = 94%  Patient Saturations on Room Air while Ambulating 155 feet = 83% with 3/4 dyspnea  Patient Saturations on   2  Liters of oxygen while Ambulating = > 89%  Please briefly explain why patient needs home oxygen:  Pt requires supplemental oxygen to achieve therapeutic levels esp with activity  Follow Up Recommendations  Home health PT     Equipment Recommendations  Other (comment) (oxygen)    Recommendations for Other Services       Precautions / Restrictions Precautions Precautions: Fall Precaution Comments: monitor O2    Mobility  Bed Mobility Overal bed mobility: Modified Independent Bed Mobility: Supine to Sit           General bed mobility comments: self  able with rail and increased time    Transfers Overall transfer level: Needs assistance Equipment used: Rolling walker (2 wheeled) Transfers: Sit to/from [facility] Sit to Stand: Supervision Stand pivot transfers: Supervision       General transfer comment: good safety cognition and use of hands to steady self  Ambulation/Gait Ambulation/Gait assistance: Supervision Gait Distance (Feet): 155 Feet Assistive device: Rolling walker (2 wheeled) Gait Pattern/deviations: Step-through pattern;Decreased stride length Gait velocity: WNL   General Gait Details: amb an increased distance >150 feet with walker first 25 feet then + 2 hand held assist the rest to get an accurate pulse Ox reading from L finger.  Pt started at RA 94% at rest but after 100 feet pt with increased  dyspnea and RA decreased to 83%.  Pt Moderately "winded" and unable to hold a full conversation during activity. Reapplied 2 lts to achieve sats > 88%. Pt required an extended rest break > 5 min to recover.   Stairs             Wheelchair Mobility    Modified Rankin (Stroke Patients Only)       Balance  Cognition Arousal/Alertness: Awake/alert Behavior During Therapy: WFL for tasks assessed/performed Overall Cognitive Status: Within Functional Limits for tasks assessed                                 General Comments: AxO x 3 very pleasant and funny      Exercises      General Comments        Pertinent Vitals/Pain Pain Assessment: Faces Faces Pain Scale: Hurts a little bit Pain Location: Chronic Back Pain Descriptors / Indicators: Aching Pain Intervention(s): Monitored during session    Home Living                      Prior Function            PT Goals (current goals can now be found in the care plan section) Progress towards PT goals: Progressing toward goals    Frequency    Min  3X/week      PT Plan Current plan remains appropriate    Co-evaluation              AM-PAC PT "6 Clicks" Mobility   Outcome Measure  Help needed turning from your back to your side while in a flat bed without using bedrails?: None Help needed moving from lying on your back to sitting on the side of a flat bed without using bedrails?: None Help needed moving to and from a bed to a chair (including a wheelchair)?: None Help needed standing up from a chair using your arms (e.g., wheelchair or bedside chair)?: None Help needed to walk in hospital room?: A Little Help needed climbing 3-5 steps with a railing? : A Little 6 Click Score: 22    End of Session Equipment Utilized During Treatment: Gait belt Activity Tolerance: Patient limited by fatigue Patient left: in chair;with call bell/phone within reach Nurse Communication: Mobility status PT Visit Diagnosis: Difficulty in walking, not elsewhere classified (R26.2);History of falling (Z91.81)     Time: [id number] PT Time Calculation (min) (ACUTE ONLY): 25 min  Charges:  $Gait Training: 8-22 mins $Therapeutic Activity: 8-22 mins                     Rica Koyanagi  PTA Acute  Rehabilitation Services Pager      [phone] Office      [phone]

## 2020-06-24 NOTE — Plan of Care (Signed)
  Problem: Education: Goal: Knowledge of General Education information will improve Description: Including pain rating scale, medication(s)/side effects and non-pharmacologic comfort measures Outcome: Progressing   Problem: Clinical Measurements: Goal: Respiratory complications will improve Outcome: Progressing Goal: Cardiovascular complication will be avoided Outcome: Progressing   Problem: Activity: Goal: Risk for activity intolerance will decrease Outcome: Progressing   Problem: Nutrition: Goal: Adequate nutrition will be maintained Outcome: Progressing

## 2020-06-24 NOTE — Progress Notes (Addendum)
Progress Note  Patient Name: Warren [name] Date of Encounter: 06/24/2020  Primary Cardiologist: Mertie Moores, MD  Subjective   No specific complaints today. Has right arm superficial vein thrombosis per doppler study   Inpatient Medications    Scheduled Meds: . apixaban  5 mg Oral BID  . chlorhexidine  15 mL Mouth Rinse BID  . Chlorhexidine Gluconate Cloth  6 each Topical Daily  . donepezil  10 mg Oral Daily  . doxycycline  100 mg Oral Q12H  . fluticasone furoate-vilanterol  1 puff Inhalation Daily  . gabapentin  300 mg Oral TID WC & HS  . mouth rinse  15 mL Mouth Rinse q12n4p  . pantoprazole  40 mg Oral Daily  . roflumilast  250 mcg Oral Daily  . tamsulosin  0.4 mg Oral Daily  . umeclidinium bromide  1 puff Inhalation Daily  . cyanocobalamin  1,000 mcg Oral Daily   Continuous Infusions: . sodium chloride 10 mL/hr at 06/20/20 0757  . sodium chloride 20 mL/hr at 06/21/20 1818  . cefTRIAXone (ROCEPHIN)  IV 2 g (06/23/20 2112)  . sodium chloride     PRN Meds: acetaminophen, albuterol, clonazePAM, fluticasone, ondansetron **OR** ondansetron (ZOFRAN) IV, oxyCODONE **OR** oxyCODONE, sodium chloride   Vital Signs    Vitals:   06/24/20 0424 06/24/20 0430 06/24/20 0811 06/24/20 0814  BP: 128/72     Pulse: 78     Resp: 18     Temp: 98.2 F (36.8 C)     TempSrc: Oral     SpO2: 96%  90% 90%  Weight:  93.9 kg    Height:        Intake/Output Summary (Last 24 hours) at 06/24/2020 0836 Last data filed at 06/24/2020 0816 Gross per 24 hour  Intake 350 ml  Output 525 ml  Net -175 ml   Filed Weights   06/22/20 0448 06/23/20 0500 06/24/20 0430  Weight: 94.2 kg 92 kg 93.9 kg    Physical Exam   General: Well developed, well nourished, NAD Lungs: Diminished in bilateral LL. No wheezes, rales, or rhonchi. Breathing is unlabored. Cardiovascular: RRR with S1 S2. No murmurs.  Abdomen: Soft, non-tender, non-distended. No obvious abdominal masses. Extremities: No edema.  Radial pulses 2+ bilaterally Neuro: Alert and oriented. No focal deficits. No facial asymmetry. MAE spontaneously. Psych: Responds to questions appropriately with normal affect.    Labs    Chemistry Recent Labs  Lab 06/21/20 0248 06/22/20 0232 06/23/20 0406 06/24/20 0506  NA 129*  --  132* 133*  K 3.6  --  3.4* 3.5  CL 94*  --  99 100  CO2 19*  --  25 23  GLUCOSE 134*  --  86 131*  BUN 39*  --  32* 19  CREATININE 2.33* 2.28* 1.53* 1.07  CALCIUM 8.2*  --  8.4* 8.3*  PROT 5.1*  --  5.0* 5.1*  ALBUMIN 2.2*  --  2.0* 2.2*  AST 54*  --  27 20  ALT 35  --  24 22  ALKPHOS 114  --  121 124  BILITOT 1.1  --  1.1 1.0  GFRNONAA 29* 30* 48* >60  ANIONGAP 16*  --  8 10     Hematology Recent Labs  Lab 06/22/20 0232 06/23/20 0406 06/24/20 0506  WBC 5.5 6.0 6.2  RBC 2.79* 3.02* 2.92*  HGB 9.1* 9.9* 9.5*  HCT 27.9* 30.1* 30.3*  MCV 100.0 99.7 103.8*  MCH 32.6 32.8 32.5  MCHC 32.6  32.9 31.4  RDW 15.4 15.3 15.6*  PLT 96* 145* 208    Cardiac EnzymesNo results for input(s): TROPONINI in the last 168 hours. No results for input(s): TROPIPOC in the last 168 hours.   BNP Recent Labs  Lab 06/19/20 0727 06/23/20 0406  BNP 1,303.5* 1,505.3*     DDimer  Recent Labs  Lab 06/19/20 0727  DDIMER 4.28*     Radiology    CT HUMERUS RIGHT WO CONTRAST  Result Date: 06/24/2020 CLINICAL DATA:  Right arm swelling and redness after IV placement. EXAM: CT OF THE RIGHT HUMERUS AND FOREARM WITHOUT CONTRAST TECHNIQUE: Multidetector CT imaging was performed according to the standard protocol. Multiplanar CT image reconstructions were also generated. COMPARISON:  None. FINDINGS: Bones/Joint/Cartilage No bony destruction or periosteal reaction. No fracture or dislocation. Mild glenohumeral and acromioclavicular osteoarthritis. Mild elbow osteoarthritis with tiny loose body in the posterior joint space (series 1, image 24). Mild scapholunate and first [facility] joint degenerative changes. No joint  effusion. Ligaments Ligaments are suboptimally evaluated by CT. Muscles and Tendons Grossly intact.  No muscle atrophy. Soft tissue Soft tissue swelling and subcutaneous fat stranding along the medial aspect of the distal upper arm and forearm. No fluid collection or hematoma. No soft tissue mass. Scarring and emphysema in the visualized right lung. IMPRESSION: 1. Soft tissue swelling and subcutaneous fat stranding along the medial aspect of the distal upper arm and forearm is nonspecific and may reflect cellulitis or superficial venous thrombophlebitis. No abscess. 2. No acute osseous abnormality. Mild osteoarthritis of the shoulder, elbow, and wrist. Electronically Signed   By: Titus Dubin M.D.   On: 06/24/2020 07:52   CT FOREARM RIGHT WO CONTRAST  Result Date: 06/24/2020 CLINICAL DATA:  Right arm swelling and redness after IV placement. EXAM: CT OF THE RIGHT HUMERUS AND FOREARM WITHOUT CONTRAST TECHNIQUE: Multidetector CT imaging was performed according to the standard protocol. Multiplanar CT image reconstructions were also generated. COMPARISON:  None. FINDINGS: Bones/Joint/Cartilage No bony destruction or periosteal reaction. No fracture or dislocation. Mild glenohumeral and acromioclavicular osteoarthritis. Mild elbow osteoarthritis with tiny loose body in the posterior joint space (series 1, image 24). Mild scapholunate and first [facility] joint degenerative changes. No joint effusion. Ligaments Ligaments are suboptimally evaluated by CT. Muscles and Tendons Grossly intact.  No muscle atrophy. Soft tissue Soft tissue swelling and subcutaneous fat stranding along the medial aspect of the distal upper arm and forearm. No fluid collection or hematoma. No soft tissue mass. Scarring and emphysema in the visualized right lung. IMPRESSION: 1. Soft tissue swelling and subcutaneous fat stranding along the medial aspect of the distal upper arm and forearm is nonspecific and may reflect cellulitis or superficial  venous thrombophlebitis. No abscess. 2. No acute osseous abnormality. Mild osteoarthritis of the shoulder, elbow, and wrist. Electronically Signed   By: Titus Dubin M.D.   On: 06/24/2020 07:52   DG CHEST PORT 1 VIEW  Result Date: 06/23/2020 CLINICAL DATA:  Cough and weakness EXAM: PORTABLE CHEST 1 VIEW COMPARISON:  0 portable exam 1017 hours compared to 11/02/2020 FINDINGS: Marked size and mediastinal contours. Bibasilar opacities similar to previous exam, could represent atelectasis or consolidation. Mild RIGHT apical scarring. Remaining lungs clear. Probable small LEFT pleural effusion. No pneumothorax or acute osseous findings. IMPRESSION: Persistent bibasilar opacities question atelectasis versus infiltrate. Probable small LEFT pleural effusion. Electronically Signed   By: Lavonia Dana M.D.   On: 06/23/2020 11:51   VAS [place] UPPER EXTREMITY VENOUS DUPLEX  Result Date: 06/23/2020 UPPER VENOUS  STUDY  Indications: Pain Risk Factors: Trauma. Comparison Study: No prior studies. Performing Technologist: Oliver Hum RVT  Examination Guidelines: A complete evaluation includes B-mode imaging, spectral Doppler, color Doppler, and power Doppler as needed of all accessible portions of each vessel. Bilateral testing is considered an integral part of a complete examination. Limited examinations for reoccurring indications may be performed as noted.  Right Findings: +----------+------------+---------+-----------+----------+-------+ RIGHT     CompressiblePhasicitySpontaneousPropertiesSummary +----------+------------+---------+-----------+----------+-------+ IJV           Full       Yes       Yes                      +----------+------------+---------+-----------+----------+-------+ Subclavian    Full       Yes       Yes                      +----------+------------+---------+-----------+----------+-------+ Axillary      Full       Yes       Yes                       +----------+------------+---------+-----------+----------+-------+ Brachial      Full       Yes       Yes                      +----------+------------+---------+-----------+----------+-------+ Radial        Full                                          +----------+------------+---------+-----------+----------+-------+ Ulnar         Full                                          +----------+------------+---------+-----------+----------+-------+ Cephalic      Full                                          +----------+------------+---------+-----------+----------+-------+ Basilic       None                                   Acute  +----------+------------+---------+-----------+----------+-------+ Thrombus located in the basilic vein is noted to only be in the [facility].  Left Findings: +----------+------------+---------+-----------+----------+-------+ LEFT      CompressiblePhasicitySpontaneousPropertiesSummary +----------+------------+---------+-----------+----------+-------+ Subclavian    Full       Yes       Yes                      +----------+------------+---------+-----------+----------+-------+  Summary:  Right: No evidence of deep vein thrombosis in the upper extremity. Findings consistent with acute superficial vein thrombosis involving the right basilic vein.  Left: No evidence of thrombosis in the subclavian.  *See table(s) above for measurements and observations.  Diagnosing physician: Deitra Mayo MD Electronically signed by Deitra Mayo MD on 06/23/2020 at 1:49:43 PM.    Final    Telemetry    06/24/20 NSR- Personally Reviewed  ECG    No new tracing as of 06/24/20 -  Personally Reviewed  Cardiac Studies   1. Left ventricular ejection fraction, by estimation, is 50 to 55%. The  left ventricle has low normal function. The left ventricle has no regional  wall motion abnormalities. There is mild left ventricular hypertrophy.  Left ventricular  diastolic  parameters are indeterminate.  2. Right ventricular systolic function is normal. The right ventricular  size is normal. There is mildly elevated pulmonary artery systolic  pressure. The estimated right ventricular systolic pressure is 32.1 mmHg.  3. Left atrial size was mildly dilated.  4. Right atrial size was mildly dilated.  5. The mitral valve is normal in structure. Mild mitral valve  regurgitation.  6. The aortic valve is tricuspid. There is moderate calcification of the  aortic valve. Aortic valve regurgitation is not visualized. Mild to  moderate aortic valve stenosis. Vmax 2.6 m/s, MG 19 mmHg, AVA 1.5 cm^2, DI  0.47  7. The inferior vena cava is dilated in size with >50% respiratory  variability, suggesting right atrial pressure of 8 mmHg.   Patient Profile     70 y.o. male with history of PE 2018 (not on anticoag prior to admission), COPD on nocturnal O2 with chronic DOE, HTN, OSA on CPAP, reported prior RV enlargement on echo, CKD III, admitted4/7/2022with 2.5 weeks of abodminal pain. He was found to have ascending cholangitis with Klebsiella bacteremia, choledocholithiasis, septic shock and AKI. Underwent ERCP with sphincterotomy 4/9 with clinical improvement. Cardiology following for development of atrial fib during admission (initial EKG NSR).  Assessment & Plan    1. Paroxysmal atrial fibrillation: -Converted to NSR by 06/23/20>>>continues to maintain NSR with rate control  -Previously treated with amiodarone however this was discontinued 06/20/20 -No currently on any rate controlling agents -Transitioned to Eliquis in the post-operative setting and is tolerating well -CHA2DS2VASc score =3 (age, HTN, vascular disease)  2. Ascending cholangitis and choledocholithiasis: -s/p ESCP 06/20/20>>previously followed by GI however they have signed off>>followed by PCCM   3. Elevated HsT: -HsT on 06/19/20>>1024>>>1169 with follow up echocardiogram showing low normal  LVEF and no WMA -Plan to pursue further ischemic evaluation once stable from current illness with OP Lexiscan stress test or CCTA if he remains in NSR  -Denies chest pain   4. 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 AKI: -Improving>>>from 1.53>>>1.07  Signed, Kathyrn Drown NP-C [facility] Pager: [phone] 06/24/2020, 8:36 AM     For questions or updates, please contact   Please consult [email or web address] for contact info under Cardiology/STEMI.  Personally seen and examined. Agree with above.   70 year old male with ascending cholangitis paroxysmal atrial fibrillation in the setting.  Now in sinus rhythm.  Overall feels well, right arm superficial thrombophlebitis  Lungs are clear heart is regular 2/6 systolic murmur, alert.  Right upper arm inner surface red slightly tender to touch  Paroxysmal atrial fibrillation -No rate controlling agents needed. -On Eliquis.  CHA2DS2-VASc score of 3 for age hypertension vascular disease.  Ascending cholangitis -ERCP.  Done.  Superficial thrombophlebitis -On Eliquis.  Warm/cold compresses  AKI -Improved.  Mild aortic stenosis -Should be of no clinical significance at this time.  Repeat echocardiogram in 3 to 5 years.  We will go ahead and sign off.  Please let us know if we can be of further assistance.  Candee Furbish, MD

## 2020-06-24 NOTE — TOC Progression Note (Signed)
Transition of Care [facility]) - Progression Note    Patient Details  Name: Warren [name] MRN: [id number] Date of Birth: [birth year]  Transition of Care [facility]) CM/SW Contact  Messiyah Waterson, Juliann Pulse, RN Phone Number: 06/24/2020, 3:54 PM  Clinical Narrative: Will need documented qualifiying 02 sats-current note doesn't qualify-nsg please provide 02 sats.[facility] following for 02 sats, & home 02 order.     Expected Discharge Plan: [place] Barriers to Discharge: Continued Medical Work up  Expected Discharge Plan and Services Expected Discharge Plan: [place]   Discharge Planning Services: CM Consult   Living arrangements for the past 2 months: Single Family Home                           HH Arranged: PT [place]: [place] Date [place]: 06/23/20 Time [place]: [phone] Representative spoke with at [place]: [place] Determinants of Health ([place]) Interventions    Readmission Risk Interventions No flowsheet data found.

## 2020-06-25 LAB — GLUCOSE, CAPILLARY
Glucose-Capillary: 102 mg/dL — ABNORMAL HIGH (ref 70–99)
Glucose-Capillary: 102 mg/dL — ABNORMAL HIGH (ref 70–99)
Glucose-Capillary: 110 mg/dL — ABNORMAL HIGH (ref 70–99)
Glucose-Capillary: 132 mg/dL — ABNORMAL HIGH (ref 70–99)

## 2020-06-25 MED ORDER — LOPERAMIDE HCL 2 MG PO CAPS
4.0000 mg | ORAL_CAPSULE | Freq: Once | ORAL | Status: AC
  Administered 2020-06-25: 4 mg via ORAL
  Filled 2020-06-25: qty 2

## 2020-06-25 MED ORDER — ONDANSETRON HCL 4 MG PO TABS: 4.0000 mg | ORAL_TABLET | Freq: Four times a day (QID) | ORAL | 0 refills | Status: DC | PRN

## 2020-06-25 MED ORDER — MAGNESIUM OXIDE 400 (241.3 MG) MG PO TABS: 400.0000 mg | ORAL_TABLET | Freq: Two times a day (BID) | ORAL | 0 refills | Status: AC

## 2020-06-25 MED ORDER — OXYCODONE HCL 10 MG PO TABS: 10.0000 mg | ORAL_TABLET | Freq: Four times a day (QID) | ORAL | 0 refills | Status: DC | PRN

## 2020-06-25 MED ORDER — DOXYCYCLINE HYCLATE 100 MG PO TABS: 100.0000 mg | ORAL_TABLET | Freq: Two times a day (BID) | ORAL | 0 refills | Status: AC

## 2020-06-25 MED ORDER — APIXABAN 5 MG PO TABS
5.0000 mg | ORAL_TABLET | Freq: Two times a day (BID) | ORAL | 2 refills | Status: DC
Start: 2020-06-25 — End: 2023-01-27

## 2020-06-25 NOTE — Discharge Summary (Signed)
Physician Discharge Summary  Warren [name] PXT:[id number] DOB: [name] 10, 1951 DOA: 06/18/2020  PCP: Imagene Riches, NP  Admit date: 06/18/2020 Discharge date: 06/25/2020  Admitted From: Home  Discharge disposition: Home with home health services  Recommendations for Outpatient Follow-Up:   . Follow up with your primary care provider in one week.  . Check CBC, BMP, magnesium in the next visit . Patient would benefit from pain management referral as outpatient. . Please follow-up with cardiology for outpatient stress test.  Discharge Diagnosis:   Principal Problem:   Choledocholithiasis with obstruction Active Problems:   GERD (gastroesophageal reflux disease)   S/P cholecystectomy   History of pancreatitis   History of pulmonary embolism   COPD (chronic obstructive pulmonary disease) (HCC)   Atrial fibrillation with rapid ventricular response ([place])   Septic shock ([place])   Discharge Condition: Improved.  Diet recommendation: Low sodium, heart healthy.    Wound care: None.  Code status: Full.   History of Present Illness:   Warren [name] a 70 y.o.malewith medical history significant ofCOPD on 3 L oxygen at night, GERD, HTN, pancreatitis, history of pulmonary embolism, s/p cholecystectomy presented to hospital with abdominal pain for 2 and half weeks.  Patient was noted to have cholangitis.  There was mention of 6 mm stone at the ampulla with significant CBD and main pancreatic duct dilatation.  He was initially in ICU for hypotension and atrial fibrillation with RVR.  He underwent ERCP on 4 9 with a limited biliary sphincterectomy.  Blood cultures showed Klebsiella  bacteremia and was continued on IV antibiotics.  Patient was then transferred out of the ICU under medical service.   Hospital Course:   Following conditions were addressed during hospitalization as listed below,  Septic Shocksecondary to Cholangitis  Klebsiella Bacteremia Cholodocholithiasis   Abdominal Pain CT scan of the abdomen and pelvis showed a 6 mm stone at the ampulla with significant biliary and pancreatic duct dilatation.  Patient underwent ERCP on 06/20/20 with periampullary duodenal diverticulum, minimally dilated CBD without stones. Limited biliary sphincterotomy was done and duct was swept.  Stone was suspected to have passed prior to ERCP.  Blood culture from 06/18/20 showed Klebsiella oxytoca resistant to ampicillin but was sensitive to cephalosporin.  Patient has completed 7-day course of Rocephin today. GI has signed off at this time.  Patient has been started on oral anticoagulation.  Patient will need to follow-up with primary care physician after discharge.  Atrial Fibrillation with RVR CHA2DS2-VASc score of 3.  On Eliquis.  Amiodarone was discontinued.  Eliquis was continued on discharge.  Elevated Troponin  Demand Ischemia vs NSTEMI Patient underwent 2D Echo 06/19/20 with no regional wall motion abnormality.  LV function was preserved.  Cardiology recommended outpatient ischemic evaluation with stress test after discharge.  Will consider statins prior to discharge since LFTs has improved.  Right upper extremity pain secondary to superficial thrombosis.  Supportive care.  Patient was advised to ice compression.  Acute Kidney Injury  creatinine of 1.0 today.  Was 2.0 on presentation.  Has improved.  Hyponatremia Mild.  Latest sodium of 133.  Mild hypomagnesium , hypophosphatemia.    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  CT scan without effusion but emphysema and bronchial wall thickening.  Continue  daliresp, trelegy ellipta on discharge .  History of pulmonary embolism. Patient was on  anticoagulation for 6 months but has been started back on Eliquis this time for A.  fib  BPH Continue flomax  Anxiety Continue klonopin nightly prn  GERD Continue PPI  Chronic back pain Had a long conversation about it.  He had gone to spine specialist and had local steroid injection with out much relief.  Was on Norco at home.  Oxycodone here has helped the patient.    Will continue oxycodone on discharge and discontinue Norco..  I have advised him to discuss this with his primary care physician/spine specialist for chronic pain management.  Continue gabapentin.  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  Ambulatory dysfunction back pain.  PT recommended home health PT on discharge  Disposition.  At this time, patient is stable for disposition home with home PT services.  Patient will need to follow-up with primary care physician cardiology as outpatient  Medical Consultants:    GI  Cardiology  PCCM  Procedures:     ERCP on 06/20/2020  Subjective:   Today, patient was seen and examined at bedside.  Patient denies any pain, nausea, vomiting, shortness of breath chills or rigor.  Discharge Exam:   Vitals:   06/25/20 0600 06/25/20 0731  BP: (!) 127/92   Pulse: (!) 108   Resp: 20   Temp: 97.7 F (36.5 C)   SpO2: 97% 90%   Vitals:   06/24/20 1350 06/24/20 2035 06/25/20 0600 06/25/20 0731  BP: (!) 150/76 124/69 (!) 127/92   Pulse: 82 86 (!) 108   Resp: 17  20   Temp: 98 F (36.7 C) 97.6 F (36.4 C) 97.7 F (36.5 C)   TempSrc: Oral Oral Oral   SpO2: 92% 97% 97% 90%  Weight:      Height:       General: Alert awake, not in obvious distress, on nasal cannula oxygen HENT: pupils equally reacting to light,  No scleral pallor or icterus noted. Oral mucosa is moist.  Chest: Decreased breath sounds bilaterally, coarse breath sounds.   CVS: S1 &S2 heard. No murmur.  Regular rate and  rhythm. Abdomen: Soft, nontender, nondistended.  Bowel sounds are heard.   Extremities: No cyanosis, clubbing or edema.  Peripheral pulses are palpable. Psych: Alert, awake and oriented, normal mood CNS:  No cranial nerve deficits.  Power equal in all extremities.   Skin: Warm and dry.  No rashes noted.  The results of significant diagnostics from this hospitalization (including imaging, microbiology, ancillary and laboratory) are listed below for reference.     Diagnostic Studies:   CT ABDOMEN PELVIS WO CONTRAST  Result Date: 06/19/2020 CLINICAL DATA:  Acute nonlocalized abdominal pain, fever, lethargy EXAM: CT ABDOMEN AND PELVIS WITHOUT CONTRAST TECHNIQUE: Multidetector CT imaging of the abdomen and pelvis was performed following the standard protocol without IV contrast. COMPARISON:  CT a chest abdomen and pelvis performed yesterday FINDINGS: Lower chest: Extensive lower lobe bronchial wall thickening. Slightly increased atelectasis compared to yesterday. Background paraseptal and centrilobular emphysema again noted. The visualized heart remains normal in size. Trace pericardial fluid versus thickening. Small hiatal hernia. Hepatobiliary: No focal liver abnormality is seen. Status post cholecystectomy. Stone again noted in the distal common bile duct. Persistent mild biliary ductal dilatation. Pancreas: Pancreatic atrophy. Punctate calcifications in the pancreatic head and uncinate process suggest prior pancreatitis. Spleen: Normal in size without focal abnormality. Adrenals/Urinary Tract: Normal adrenal  glands. Persistent contrast within the kidneys and renal collecting system. No bladder mass visualized. Stomach/Bowel: Colonic diverticular disease without CT evidence of active inflammation. Normal appendix. No evidence of bowel obstruction. Vascular/Lymphatic: Limited evaluation in the absence of intravenous contrast. Atherosclerotic calcifications throughout the aorta. Reproductive: Prostate is  unremarkable. Other: No abdominal wall hernia or ascites. Musculoskeletal: No acute fracture or aggressive appearing lytic or blastic osseous lesion. IMPRESSION: 1. Minimal interval change compared to CT imaging performed yesterday. 2. Persistent distal common bile duct stone with associated mild biliary ductal dilatation. 3. Increasing bibasilar atelectasis. 4. Chronic bronchitic changes and emphysema suggests underlying COPD. 5. Additional ancillary findings as above without significant interval change. Electronically Signed   By: Jacqulynn Cadet M.D.   On: 06/19/2020 06:37   DG CHEST PORT 1 VIEW  Result Date: 06/19/2020 CLINICAL DATA:  Respiratory tract congestion. EXAM: PORTABLE CHEST 1 VIEW COMPARISON:  June 18, 2020. FINDINGS: Stable cardiomediastinal silhouette. No pneumothorax is noted. Stable bibasilar opacities are noted concerning for atelectasis possibly infiltrates. Bony thorax is unremarkable. IMPRESSION: Stable bibasilar opacities as described above. Electronically Signed   By: Marijo Conception M.D.   On: 06/19/2020 08:28   DG Chest Port 1 View  Result Date: 06/18/2020 CLINICAL DATA:  Cough and short of breath EXAM: PORTABLE CHEST 1 VIEW COMPARISON:  05/08/2020 FINDINGS: COPD with pulmonary scarring similar to the prior study. Mild left lower lobe airspace disease is new since the prior study. Possible small left effusion. IMPRESSION: Mild left lower lobe atelectasis/infiltrate. Possible small left effusion COPD with scarring. Electronically Signed   By: Franchot Gallo M.D.   On: 06/18/2020 14:13   ECHOCARDIOGRAM COMPLETE  Result Date: 06/19/2020    ECHOCARDIOGRAM REPORT   Patient Name:   Ladanian L [name] Date of Exam: 06/19/2020 Medical Rec #:  [id number]      Height:       71.0 in Accession #:    [id number]     Weight:       190.7 lb Date of Birth:  10-15-1950      BSA:          2.066 m Patient Age:    34 years       BP:           97/41 mmHg Patient Gender: M              HR:           87 bpm.  Exam Location:  Inpatient Procedure: 2D Echo, Color Doppler and Cardiac Doppler Indications:    I48.91* Unspeicified atrial fibrillation  History:        Patient has no prior history of Echocardiogram examinations.                 COPD; Risk Factors:Hypertension.  Sonographer:    Bernadene Person RDCS Referring Phys: [id number] A CALDWELL [place]  1. Left ventricular ejection fraction, by estimation, is 50 to 55%. The left ventricle has low normal function. The left ventricle has no regional wall motion abnormalities. There is mild left ventricular hypertrophy. Left ventricular diastolic parameters are indeterminate.  2. Right ventricular systolic function is normal. The right ventricular size is normal. There is mildly elevated pulmonary artery systolic pressure. The estimated right ventricular systolic pressure is 13.0 mmHg.  3. Left atrial size was mildly dilated.  4. Right atrial size was mildly dilated.  5. The mitral valve is normal in structure. Mild mitral valve regurgitation.  6. The aortic valve  is tricuspid. There is moderate calcification of the aortic valve. Aortic valve regurgitation is not visualized. Mild to moderate aortic valve stenosis. Vmax 2.6 m/s, MG 19 mmHg, AVA 1.5 cm^2, DI 0.47  7. The inferior vena cava is dilated in size with >50% respiratory variability, suggesting right atrial pressure of 8 mmHg. FINDINGS  Left Ventricle: Left ventricular ejection fraction, by estimation, is 50 to 55%. The left ventricle has low normal function. The left ventricle has no regional wall motion abnormalities. The left ventricular internal cavity size was normal in size. There is mild left ventricular hypertrophy. Left ventricular diastolic parameters are indeterminate. Right Ventricle: The right ventricular size is normal. Right vetricular wall thickness was not well visualized. Right ventricular systolic function is normal. There is mildly elevated pulmonary artery systolic pressure. The tricuspid  regurgitant velocity  is 2.98 m/s, and with an assumed right atrial pressure of 8 mmHg, the estimated right ventricular systolic pressure is 38.4 mmHg. Left Atrium: Left atrial size was mildly dilated. Right Atrium: Right atrial size was mildly dilated. Pericardium: There is no evidence of pericardial effusion. Presence of pericardial fat pad. Mitral Valve: The mitral valve is normal in structure. Mild mitral valve regurgitation. Tricuspid Valve: The tricuspid valve is normal in structure. Tricuspid valve regurgitation is trivial. Aortic Valve: The aortic valve is tricuspid. There is moderate calcification of the aortic valve. Aortic valve regurgitation is not visualized. Mild to moderate aortic stenosis is present. Aortic valve mean gradient measures 17.0 mmHg. Aortic valve peak gradient measures 23.7 mmHg. Aortic valve area, by VTI measures 1.50 cm. Pulmonic Valve: The pulmonic valve was not well visualized. Pulmonic valve regurgitation is not visualized. Aorta: The aortic root is normal in size and structure. Venous: The inferior vena cava is dilated in size with greater than 50% respiratory variability, suggesting right atrial pressure of 8 mmHg. IAS/Shunts: The interatrial septum was not well visualized.  LEFT VENTRICLE PLAX 2D LVIDd:         5.20 cm LVIDs:         3.70 cm LV PW:         1.10 cm LV IVS:        0.80 cm LVOT diam:     2.00 cm LV SV:         72 LV SV Index:   35 LVOT Area:     3.14 cm  RIGHT VENTRICLE RV S prime:     10.80 cm/s TAPSE (M-mode): 1.2 cm LEFT ATRIUM             Index       RIGHT ATRIUM           Index LA diam:        4.30 cm 2.08 cm/m  RA Area:     22.00 cm LA Vol (A2C):   78.1 ml 37.80 ml/m RA Volume:   60.10 ml  29.09 ml/m LA Vol (A4C):   86.0 ml 41.62 ml/m LA Biplane Vol: 84.8 ml 41.04 ml/m  AORTIC VALVE AV Area (Vmax):    1.62 cm AV Area (Vmean):   1.54 cm AV Area (VTI):     1.50 cm AV Vmax:           243.67 cm/s AV Vmean:          195.000 cm/s AV VTI:            0.478  m AV Peak Grad:      23.7 mmHg AV Mean Grad:  17.0 mmHg LVOT Vmax:         125.33 cm/s LVOT Vmean:        95.567 cm/s LVOT VTI:          0.228 m LVOT/AV VTI ratio: 0.48  AORTA Ao Root diam: 3.50 cm MR Peak grad: 92.9 mmHg   TRICUSPID VALVE MR Mean grad: 70.0 mmHg   TR Peak grad:   35.5 mmHg MR Vmax:      482.00 cm/s TR Vmax:        298.00 cm/s MR Vmean:     407.0 cm/s                           SHUNTS                           Systemic VTI:  0.23 m                           Systemic Diam: 2.00 cm Oswaldo Milian MD Electronically signed by Oswaldo Milian MD Signature Date/Time: 06/19/2020/8:06:09 PM    Final    CT Angio Chest/Abd/Pel for Dissection W and/or W/WO  Result Date: 06/18/2020 CLINICAL DATA:  Epigastric abdominal pain radiating to the upper back and shoulder. Hypotension. EXAM: CT ANGIOGRAPHY CHEST, ABDOMEN AND PELVIS TECHNIQUE: Non-contrast CT of the chest was initially obtained. Multidetector CT imaging through the chest, abdomen and pelvis was performed using the standard protocol during bolus administration of intravenous contrast. Multiplanar reconstructed images and MIPs were obtained and reviewed to evaluate the vascular anatomy. CONTRAST:  71m OMNIPAQUE IOHEXOL 350 MG/ML SOLN COMPARISON:  05/27/2016 chest CT angiogram. 08/01/2016 PET-CT. [id number] abdomen/pelvis. FINDINGS: CTA CHEST FINDINGS Motion degraded scan limits assessment. Cardiovascular: Normal heart size. No significant pericardial effusion/thickening. Three-vessel coronary atherosclerosis. Patent aortic arch branch vessels. Atherosclerotic nonaneurysmal thoracic aorta. No acute intramural hematoma, dissection, pseudoaneurysm or penetrating atherosclerotic ulcer in the thoracic aorta. Top-normal caliber main pulmonary artery (3.4 cm diameter). No central pulmonary emboli. Mediastinum/Nodes: No discrete thyroid nodules. Unremarkable esophagus. No pathologically enlarged axillary, mediastinal or hilar lymph nodes.  Lungs/Pleura: No pneumothorax. No pleural effusion. Severe centrilobular and paraseptal emphysema with diffuse bronchial wall thickening. No acute consolidative airspace disease, lung masses or significant pulmonary nodules. Musculoskeletal: No aggressive appearing focal osseous lesions. Mild thoracic spondylosis. Review of the MIP images confirms the above findings. CTA ABDOMEN AND PELVIS FINDINGS VASCULAR Aorta: Atherosclerotic nonaneurysmal abdominal aorta with no dissection or significant stenosis. Celiac: Approximately 60% proximal atherosclerotic stenosis. No aneurysm or dissection. SMA: Patent without evidence of aneurysm, dissection, vasculitis or significant stenosis. Renals: Approximately 50% atherosclerotic proximal stenoses in the bilateral renal arteries. IMA: Patent without evidence of aneurysm, dissection, vasculitis or significant stenosis. Inflow: Focal high-grade atherosclerotic stenosis in the right common femoral artery (series 6/image 187). Otherwise patent without dissection or aneurysm. Veins: No obvious venous abnormality within the limitations of this arterial phase study. Review of the MIP images confirms the above findings. NON-VASCULAR Hepatobiliary: Normal liver with no liver mass. Cholecystectomy. There is apparent 6 mm stone at the ampulla (series 6/image 130). Common bile duct diameter 15 mm. Mild diffuse central intrahepatic biliary ductal dilatation. Pancreas: Newly dilated main pancreatic duct (5 mm diameter). No discrete pancreatic mass. Spleen: Normal size. No mass. Adrenals/Urinary Tract: Normal adrenals. No hydronephrosis. No contour deforming renal masses. Normal bladder. Stomach/Bowel: Small hiatal hernia. Otherwise normal nondistended stomach. Normal caliber small  bowel with no small bowel wall thickening. Scattered tiny calcified stones in the otherwise normal appendix. Marked sigmoid diverticulosis with no large bowel wall thickening or significant pericolonic fat  stranding. Vascular/Lymphatic: No pathologically enlarged lymph nodes in the abdomen or pelvis. Reproductive: Normal size prostate. Other: No pneumoperitoneum, ascites or focal fluid collection. Musculoskeletal: No aggressive appearing focal osseous lesions. Marked lumbar spondylosis. Review of the MIP images confirms the above findings. IMPRESSION: 1. No acute aortic syndrome. 2. Apparent 6 mm stone at the ampulla, with significant common bile duct and main pancreatic duct dilatation. No discrete pancreatic mass. Recommend correlation with serum bilirubin levels. GI consultation for consideration of ERCP suggested. 3. Focal high-grade atherosclerotic stenosis in the right common femoral artery. 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 Approximately 50% proximal bilateral renal artery stenoses. 4. Three-vessel coronary atherosclerosis. 5. Small hiatal hernia. 6. Marked sigmoid diverticulosis. 7. Aortic Atherosclerosis (ICD10-I70.0) and Emphysema (ICD10-J43.9). Electronically Signed   By: Ilona Sorrel M.D.   On: 06/18/2020 16:34   VAS [place] LOWER EXTREMITY VENOUS (DVT)  Result Date: 06/19/2020  Lower Venous DVT Study Indications: Edema.  Comparison Study: No previous exams Performing Technologist: Rogelia Rohrer  Examination Guidelines: A complete evaluation includes B-mode imaging, spectral Doppler, color Doppler, and power Doppler as needed of all accessible portions of each vessel. Bilateral testing is considered an integral part of a complete examination. Limited examinations for reoccurring indications may be performed as noted. The reflux portion of the exam is performed with the patient in reverse Trendelenburg.  +---------+---------------+---------+-----------+----------+--------------+ RIGHT    CompressibilityPhasicitySpontaneityPropertiesThrombus Aging +---------+---------------+---------+-----------+----------+--------------+ CFV      Full           Yes      Yes                                  +---------+---------------+---------+-----------+----------+--------------+ SFJ      Full                                                        +---------+---------------+---------+-----------+----------+--------------+ FV Prox  Full           Yes      Yes                                 +---------+---------------+---------+-----------+----------+--------------+ FV Mid   Full           Yes      Yes                                 +---------+---------------+---------+-----------+----------+--------------+ FV DistalFull           Yes      Yes                                 +---------+---------------+---------+-----------+----------+--------------+ PFV      Full                                                        +---------+---------------+---------+-----------+----------+--------------+  POP      Full           Yes      Yes                                 +---------+---------------+---------+-----------+----------+--------------+ PTV      Full                                                        +---------+---------------+---------+-----------+----------+--------------+ PERO     Full                                                        +---------+---------------+---------+-----------+----------+--------------+   +---------+---------------+---------+-----------+----------+--------------+ LEFT     CompressibilityPhasicitySpontaneityPropertiesThrombus Aging +---------+---------------+---------+-----------+----------+--------------+ CFV      Full           Yes      Yes                                 +---------+---------------+---------+-----------+----------+--------------+ SFJ      Full                                                        +---------+---------------+---------+-----------+----------+--------------+ FV Prox  Full           Yes      Yes                                  +---------+---------------+---------+-----------+----------+--------------+ FV Mid   Full           Yes      Yes                                 +---------+---------------+---------+-----------+----------+--------------+ FV DistalFull           Yes      Yes                                 +---------+---------------+---------+-----------+----------+--------------+ PFV      Full                                                        +---------+---------------+---------+-----------+----------+--------------+ POP      Full           Yes      Yes                                 +---------+---------------+---------+-----------+----------+--------------+ PTV  Full                                                        +---------+---------------+---------+-----------+----------+--------------+ PERO     Full                                                        +---------+---------------+---------+-----------+----------+--------------+     Summary: BILATERAL: - No evidence of deep vein thrombosis seen in the lower extremities, bilaterally. - No evidence of superficial venous thrombosis in the lower extremities, bilaterally. -No evidence of popliteal cyst, bilaterally.   *See table(s) above for measurements and observations. Electronically signed by Deitra Mayo MD on 06/19/2020 at 3:40:29 PM.    Final      Labs:   Basic Metabolic Panel: Recent Labs  Lab 06/19/20 0431 06/20/20 0221 06/21/20 0248 06/22/20 0232 06/23/20 0406 06/24/20 0506  NA 134* 128* 129*  --  132* 133*  K 3.5 3.8 3.6  --  3.4* 3.5  CL 100 95* 94*  --  99 100  CO2 22 22 19*  --  25 23  GLUCOSE 60* 137* 134*  --  86 131*  BUN 31* 33* 39*  --  32* 19  CREATININE 1.65* 1.76* 2.33* 2.28* 1.53* 1.07  CALCIUM 8.3* 8.0* 8.2*  --  8.4* 8.3*  MG 1.3*  --   --   --   --  1.5*  PHOS 2.1*  --   --   --   --  2.1*   GFR Estimated Creatinine Clearance: 74.3 mL/min (by C-G formula based on SCr of  1.07 mg/dL). Liver Function Tests: Recent Labs  Lab 06/19/20 0431 06/20/20 0221 06/21/20 0248 06/23/20 0406 06/24/20 0506  AST 110* 79* 54* 27 20  ALT 54* 43 35 24 22  ALKPHOS 192* 139* 114 121 124  BILITOT 4.1* 2.4* 1.1 1.1 1.0  PROT 5.3* 5.1* 5.1* 5.0* 5.1*  ALBUMIN 2.1* 2.1* 2.2* 2.0* 2.2*   Recent Labs  Lab 06/19/20 0431 06/21/20 0248 06/23/20 0406  LIPASE 40 69* 81*   No results for input(s): AMMONIA in the last 168 hours. Coagulation profile No results for input(s): INR, PROTIME in the last 168 hours.  CBC: Recent Labs  Lab 06/19/20 0431 06/20/20 0221 06/21/20 0248 06/22/20 0232 06/23/20 0406 06/24/20 0506  WBC 10.3 11.0* 7.8 5.5 6.0 6.2  NEUTROABS 9.4*  --   --   --   --  3.6  HGB 9.5* 9.6* 9.5* 9.1* 9.9* 9.5*  HCT 29.5* 29.6* 28.6* 27.9* 30.1* 30.3*  MCV 102.1* 101.4* 97.9 100.0 99.7 103.8*  PLT 118* 93* 81* 96* 145* 208   Cardiac Enzymes: No results for input(s): CKTOTAL, CKMB, CKMBINDEX, TROPONINI in the last 168 hours. BNP: Invalid input(s): POCBNP CBG: Recent Labs  Lab 06/24/20 2008 06/25/20 0009 06/25/20 0409 06/25/20 0725 06/25/20 1111  GLUCAP 170* 102* 102* 110* 132*   D-Dimer No results for input(s): DDIMER in the last 72 hours. Hgb A1c No results for input(s): HGBA1C in the last 72 hours. Lipid Profile No results for input(s): CHOL, HDL, LDLCALC, TRIG, CHOLHDL, LDLDIRECT in the last 72 hours. Thyroid function studies No results  for input(s): TSH, T4TOTAL, T3FREE, THYROIDAB in the last 72 hours.  Invalid input(s): FREET3 Anemia work up No results for input(s): VITAMINB12, FOLATE, FERRITIN, TIBC, IRON, RETICCTPCT in the last 72 hours. Microbiology Recent Results (from the past 240 hour(s))  Culture, blood (routine x 2)     Status: Abnormal   Collection Time: 06/18/20  4:24 PM   Specimen: BLOOD  Result Value Ref Range Status   Specimen Description   Final    BLOOD RIGHT ANTECUBITAL Performed at [facility] [address] [place], [place] [address]    Special Requests   Final    BOTTLES DRAWN AEROBIC AND ANAEROBIC Blood Culture adequate volume Performed at [facility] [address], [place], [place] [address]    Culture  Setup Time   Final    GRAM NEGATIVE RODS IN BOTH AEROBIC AND ANAEROBIC BOTTLES CRITICAL VALUE NOTED.  VALUE IS CONSISTENT WITH PREVIOUSLY REPORTED AND CALLED VALUE.    Culture (A)  Final    KLEBSIELLA OXYTOCA SUSCEPTIBILITIES PERFORMED ON PREVIOUS CULTURE WITHIN THE LAST 5 DAYS. Performed at [facility], [place] [address] [place], [place] [address]    Report Status 06/21/2020 FINAL  Final  Culture, blood (routine x 2)     Status: Abnormal   Collection Time: 06/18/20  4:34 PM   Specimen: BLOOD LEFT FOREARM  Result Value Ref Range Status   Specimen Description   Final    BLOOD LEFT FOREARM Performed at [facility] [address], [place], [place] [address]    Special Requests   Final    BOTTLES DRAWN AEROBIC AND ANAEROBIC Blood Culture adequate volume Performed at [facility] [address], [place], [place] [address]    Culture  Setup Time   Final    GRAM NEGATIVE RODS IN BOTH AEROBIC AND ANAEROBIC BOTTLES CRITICAL RESULT CALLED TO, READ BACK BY AND VERIFIED WITH: PHARMD D.WOFFORD AT 1051 ON 04/21/20 BY KJ Performed at [facility], [place] [address], [place],  [address]    Culture KLEBSIELLA OXYTOCA (A)  Final   Report Status 06/21/2020 FINAL  Final   Organism ID, Bacteria KLEBSIELLA OXYTOCA  Final      Susceptibility   Klebsiella oxytoca - MIC*    AMPICILLIN >=32 RESISTANT Resistant     CEFAZOLIN 16 SENSITIVE Sensitive     CEFEPIME <=0.12 SENSITIVE Sensitive     CEFTAZIDIME <=1 SENSITIVE Sensitive     CEFTRIAXONE <=0.25 SENSITIVE Sensitive     CIPROFLOXACIN <=0.25 SENSITIVE Sensitive     GENTAMICIN <=1 SENSITIVE Sensitive     IMIPENEM <=0.25 SENSITIVE Sensitive      TRIMETH/SULFA <=20 SENSITIVE Sensitive     AMPICILLIN/SULBACTAM 8 SENSITIVE Sensitive     PIP/TAZO <=4 SENSITIVE Sensitive     * KLEBSIELLA OXYTOCA  Blood Culture ID Panel (Reflexed)     Status: Abnormal   Collection Time: 06/18/20  4:34 PM  Result Value Ref Range Status   Enterococcus faecalis NOT DETECTED NOT DETECTED Final   Enterococcus Faecium NOT DETECTED NOT DETECTED Final   Listeria monocytogenes NOT DETECTED NOT DETECTED Final   Staphylococcus species NOT DETECTED NOT DETECTED Final   Staphylococcus aureus (BCID) NOT DETECTED NOT DETECTED Final   Staphylococcus epidermidis NOT DETECTED NOT DETECTED Final   Staphylococcus lugdunensis NOT DETECTED NOT DETECTED Final   Streptococcus species NOT DETECTED NOT DETECTED Final   Streptococcus agalactiae NOT DETECTED NOT DETECTED Final   Streptococcus pneumoniae NOT  DETECTED NOT DETECTED Final   Streptococcus pyogenes NOT DETECTED NOT DETECTED Final   A.calcoaceticus-baumannii NOT DETECTED NOT DETECTED Final   Bacteroides fragilis NOT DETECTED NOT DETECTED Final   Enterobacterales DETECTED (A) NOT DETECTED Final    Comment: Enterobacterales represent a large order of gram negative bacteria, not a single organism. CRITICAL RESULT CALLED TO, READ BACK BY AND VERIFIED WITH: PHARMD D.WOFFORD ON 06/19/20 AT 1051 BY KJ    Enterobacter cloacae complex NOT DETECTED NOT DETECTED Final   Escherichia coli NOT DETECTED NOT DETECTED Final   Klebsiella aerogenes NOT DETECTED NOT DETECTED Final   Klebsiella oxytoca DETECTED (A) NOT DETECTED Final    Comment: CRITICAL RESULT CALLED TO, READ BACK BY AND VERIFIED WITH: PHARMD D.WOFFORD AT 1051 ON 04/21/20 [place] KJ    Klebsiella pneumoniae NOT DETECTED NOT DETECTED Final   Proteus species NOT DETECTED NOT DETECTED Final   Salmonella species NOT DETECTED NOT DETECTED Final   Serratia marcescens NOT DETECTED NOT DETECTED Final   Haemophilus influenzae NOT DETECTED NOT DETECTED Final   Neisseria  meningitidis NOT DETECTED NOT DETECTED Final   Pseudomonas aeruginosa NOT DETECTED NOT DETECTED Final   Stenotrophomonas maltophilia NOT DETECTED NOT DETECTED Final   Candida albicans NOT DETECTED NOT DETECTED Final   Candida auris NOT DETECTED NOT DETECTED Final   Candida glabrata NOT DETECTED NOT DETECTED Final   Candida krusei NOT DETECTED NOT DETECTED Final   Candida parapsilosis NOT DETECTED NOT DETECTED Final   Candida tropicalis NOT DETECTED NOT DETECTED Final   Cryptococcus neoformans/gattii NOT DETECTED NOT DETECTED Final   CTX-M ESBL NOT DETECTED NOT DETECTED Final   Carbapenem resistance IMP NOT DETECTED NOT DETECTED Final   Carbapenem resistance KPC NOT DETECTED NOT DETECTED Final   Carbapenem resistance NDM NOT DETECTED NOT DETECTED Final   Carbapenem resist OXA 48 LIKE NOT DETECTED NOT DETECTED Final   Carbapenem resistance VIM NOT DETECTED NOT DETECTED Final    Comment: Performed at [facility], [address] [place], [place] [address]  Resp Panel by RT-PCR (Flu A&B, Covid) Nasopharyngeal Swab     Status: None   Collection Time: 06/18/20  5:55 PM   Specimen: Nasopharyngeal Swab; Nasopharyngeal(NP) swabs in vial transport medium  Result Value Ref Range Status   SARS Coronavirus 2 by RT PCR NEGATIVE NEGATIVE Final    Comment: (NOTE) SARS-CoV-2 target nucleic acids are NOT DETECTED.  The SARS-CoV-2 RNA is generally detectable in upper respiratory specimens during the acute phase of infection. The lowest concentration of SARS-CoV-2 viral copies this assay can detect is 138 copies/mL. A negative result does not preclude SARS-Cov-2 infection and should not be used as the sole basis for treatment or other patient management decisions. A negative result may occur with  improper specimen collection/handling, submission of specimen other than nasopharyngeal swab, presence of viral mutation(s) within the areas targeted by this assay, and inadequate number of  viral copies(<138 copies/mL). A negative result must be combined with clinical observations, patient history, and epidemiological information. The expected result is Negative.  Fact Sheet for Patients:  EntrepreneurPulse.com.au  Fact Sheet for Healthcare Providers:  IncredibleEmployment.be  This test is no t yet approved or cleared by the [place] FDA and  has been authorized for detection and/or diagnosis of SARS-CoV-2 by FDA under an Emergency Use Authorization (EUA). This EUA will remain  in effect (meaning this test can be used) for the duration of the COVID-19 declaration under Section 564(b)(1) of the Act, 21 U.S.C.section 360bbb-3(b)(1),  unless the authorization is terminated  or revoked sooner.       Influenza A by PCR NEGATIVE NEGATIVE Final   Influenza B by PCR NEGATIVE NEGATIVE Final    Comment: (NOTE) The Xpert Xpress SARS-CoV-2/FLU/RSV plus assay is intended as an aid in the diagnosis of influenza from Nasopharyngeal swab specimens and should not be used as a sole basis for treatment. Nasal washings and aspirates are unacceptable for Xpert Xpress SARS-CoV-2/FLU/RSV testing.  Fact Sheet for Patients: EntrepreneurPulse.com.au  Fact Sheet for Healthcare Providers: IncredibleEmployment.be  This test is not yet approved or cleared by the [place] FDA and has been authorized for detection and/or diagnosis of SARS-CoV-2 by FDA under an Emergency Use Authorization (EUA). This EUA will remain in effect (meaning this test can be used) for the duration of the COVID-19 declaration under Section 564(b)(1) of the Act, 21 U.S.C. section 360bbb-3(b)(1), unless the authorization is terminated or revoked.  Performed at [facility], [place] [address], [place], [place] [address]   MRSA PCR Screening     Status: None   Collection Time: 06/19/20  4:54 AM   Specimen: Nasopharyngeal   Result Value Ref Range Status   MRSA by PCR NEGATIVE NEGATIVE Final    Comment:        The GeneXpert MRSA Assay (FDA approved for NASAL specimens only), is one component of a comprehensive MRSA colonization surveillance program. It is not intended to diagnose MRSA infection nor to guide or monitor treatment for MRSA infections. Performed at [facility], [place] [address], [place], [place] [address]      Discharge Instructions:   Discharge Instructions    Call MD for:  persistant nausea and vomiting   Complete by: As directed    Call MD for:  severe uncontrolled pain   Complete by: As directed    Call MD for:  temperature >100.4   Complete by: As directed    Diet - low sodium heart healthy   Complete by: As directed    Discharge instructions   Complete by: As directed    Please follow-up with your primary care provider in 1 week.  Discuss about getting pain management referral for ongoing pain management.  Continue oxygen at home.  Follow-up with cardiology as outpatient for outpatient stress test.  Ice compression to the right upper arm.   Increase activity slowly   Complete by: As directed      Allergies as of 06/25/2020   No Known Allergies     Medication List    STOP taking these medications   HYDROcodone-acetaminophen 10-325 MG tablet Commonly known as: NORCO     TAKE these medications   albuterol 108 (90 Base) MCG/ACT inhaler Commonly known as: VENTOLIN HFA Inhale 2 puffs into the lungs every 4 (four) hours as needed for wheezing or shortness of breath.   apixaban 5 MG Tabs tablet Commonly known as: ELIQUIS Take 1 tablet (5 mg total) by mouth 2 (two) times daily.   clonazePAM 1 MG tablet Commonly known as: KLONOPIN Take 1 mg by mouth at bedtime as needed for anxiety.   cyanocobalamin 1000 MCG tablet Take 1,000 mcg by mouth daily.   Daliresp 250 MCG Tabs Generic drug: Roflumilast Take 250 mcg by mouth daily.   donepezil 10 MG  tablet Commonly known as: ARICEPT Take 10 mg by mouth daily.   doxycycline 100 MG tablet Commonly known as: VIBRA-TABS Take 1 tablet (100 mg total) by mouth every 12 (twelve) hours  for 3 days.   famotidine 20 MG tablet Commonly known as: PEPCID Take 20 mg by mouth daily.   fluticasone 50 MCG/ACT nasal spray Commonly known as: FLONASE Place 1 spray into both nostrils daily as needed for allergies or rhinitis.   Fluticasone-Umeclidin-Vilant 100-62.5-25 MCG/INH Aepb Inhale 1 puff into the lungs daily.   gabapentin 300 MG capsule Commonly known as: NEURONTIN Take 300 mg by mouth 4 (four) times daily.   IRON FORMULA PO Take 81 mg by mouth 2 (two) times daily.   magnesium oxide 400 (241.3 Mg) MG tablet Commonly known as: MAG-OX Take 1 tablet (400 mg total) by mouth 2 (two) times daily for 15 days.   mometasone 50 MCG/ACT nasal spray Commonly known as: NASONEX Place 2 sprays into the nose daily.   nystatin cream Commonly known as: MYCOSTATIN Apply 1 application topically 2 (two) times daily.   omeprazole 40 MG capsule Commonly known as: PRILOSEC Take 1 capsule (40 mg total) by mouth daily.   ondansetron 4 MG tablet Commonly known as: ZOFRAN Take 1 tablet (4 mg total) by mouth every 6 (six) hours as needed for nausea.   Oxycodone HCl 10 MG Tabs Take 1 tablet (10 mg total) by mouth every 6 (six) hours as needed for severe pain.   potassium chloride SA 20 MEQ tablet Commonly known as: KLOR-CON Take 20 mEq by mouth daily.   tamsulosin 0.4 MG Caps capsule Commonly known as: FLOMAX Take 0.4 mg by mouth daily.   triamcinolone ointment 0.1 % Commonly known as: KENALOG Apply 1 application topically daily as needed.   VITAMIN D (ERGOCALCIFEROL) PO Take 5,000 Units by mouth daily.            Durable Medical Equipment  (From admission, onward)         Start     Ordered   06/25/20 1128  For home use only DME oxygen  Once       Question Answer Comment  Length  of Need Lifetime   Mode or (Route) Nasal cannula   Liters per Minute 3   Frequency Continuous (stationary and portable oxygen unit needed)   Oxygen conserving device Yes   Oxygen delivery system Gas      06/25/20 1127          Follow-up Information    Dunn, Dayna N, PA-C Follow up.   Specialties: Cardiology, Radiology Why: [facility] (Cardiology) - [facility] location - a follow-up has been arranged for you on Monday Jul 20, 2020 at 11:15 AM (Arrive by 11:00 AM). Lisbeth Renshaw is the PA you met in the hospital with our cardiology team. Contact information: [address] [place] [address] [place] [address] [place], [place] Follow up.   Why: [place] [facility] Contact information: [place] [address] [phone]        Gwenevere Ghazi, MD Follow up.   Specialty: Pulmonary Disease Why: follow your scheduled appointment Contact information: [facility] [place] [address] [phone]        [facility] Follow up.   Why: oxygen Contact information: [place] [address] [phone]                Time coordinating discharge: 39 minutes  Signed:  Horice Carrero  [facility] 06/25/2020, 2:26 PM

## 2020-06-25 NOTE — TOC Transition Note (Signed)
Transition of Care [facility]) - CM/SW Discharge Note   Patient Details  Name: Warren [name] MRN: [id number] Date of Birth: 03-Aug-1950  [facility]) CM/SW Contact:  Dessa Phi, RN Phone Number: 06/25/2020, 11:16 AM   Clinical Narrative: d/c home w/[facility]PT-[facility];Home 02-[facility] to deliver to rm prior d/c. No further CM needs.      Final next level of care: [place] Barriers to Discharge: No Barriers Identified   Patient Goals and CMS Choice Patient states their goals for this hospitalization and ongoing recovery are:: to go home CMS Medicare.gov Compare Post Acute Care list provided to:: Patient    Discharge Placement                       Discharge Plan and Services   Discharge Planning Services: CM Consult                      HH Arranged: PT [facility] Agency: [place] Date [place]: 06/23/20 Time [place]: 2536 Representative spoke with at [place]: [place] Determinants of Health ([place]) Interventions     Readmission Risk Interventions No flowsheet data found.

## 2020-06-25 NOTE — Plan of Care (Signed)

## 2020-07-19 ENCOUNTER — Encounter: Payer: Self-pay | Admitting: Physician Assistant

## 2020-07-19 NOTE — Progress Notes (Addendum)
Cardiology Office Note    Date:  07/20/2020   ID:  [name], DOB 1950-03-18, MRN [id number]  PCP:  Imagene Riches, NP  Cardiologist:  Mertie Moores, MD  Electrophysiologist:  None   Chief Complaint: f/u recent hospital stay for PAF, elevated troponin, numerous medical issues.  History of Present Illness:   Warren [name] is a 70 y.o. male with history of COPD with chronic respiratory failure on home O2 QHS, GERD, HTN, pancreatitis, PE in 2018, cholecystectomy anxiety, chronic back pain, BPH, recently diagnosed PAF, elevated troponin, mild-moderate AS, PAD and coronary calcification by CT who presents for post-hospital follow-up.  He was previously seen in 2018 by Dr. Geraldo Pitter (our team) and Dr. Otho Perl (outside hospital). Dr. Sudie Grumbling note references a recent nuclear stress test that was reassuring at that time. He was diagnosed with PE during that year. The patient reports this was treated with 6 months of anticoagulation then stopped. He reports that earlier this year he was referred to [facility] cardiology for a valve issue and was told no other workup was needed at that time. 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 CT also showed 3V CAD as well as aortic atherosclerosis. No central PE or aortic pathology. He developed atrial fib during that admission and converted to NSR while in the hospital, briefly treated with amiodarone. He was placed on Eliquis for CHADSVASC of 3. 2D echo was reassuring with EF 50-55%, mild LVH, mildly elevated PASP, mild BAE, mild-moderate AS. It was recommended to pursue Lexiscan versus coronary CTA as OP.  He is seen back for follow-up today overall  clinically stable from cardiac standpoint. He has not had any chest pain whatsoever. He relays a history of significant COPD, followed by Dr. Glade Lloyd at [facility]. His dyspnea has been steadily worsening for the past year and a half but no recent declines with regard to his recent hospitalization. Most notably is his chronic debilitating back pain which keeps him from doing the majority of activities he used to enjoy, like walking his dogs. He has pursued noninvasive measures like cortisone shots without relief. He states he was told surgery was not an option. He has an appointment coming up with pain management to discuss options. No palpitations. No leg claudication but with his chronic low back pain he finds it hard to differentiate between some buttocks/hip pain as well.   Labwork independently reviewed: 06/2020 multiple lab abnormalities with last Mg 1.5, Na 133, K 3.5, Cr 1.07 (improved), albumin 2.2, LFTs otherwise OK, BNP high 1303, Hgb 9.9, TSH 0.121, d-dimer 4.28, troponin 1024   Past Medical History:  Diagnosis Date  . Allergy   . Anemia   . Aortic stenosis   . COPD (chronic obstructive pulmonary disease) ([place])   . Elevated troponin   . GERD (gastroesophageal reflux disease)   . History of pulmonary embolus (PE)   . Hypertension   . Insomnia   . OSA (obstructive sleep apnea)   . PAD (peripheral artery disease) ([place])    by CT imaging  . PAF (paroxysmal atrial fibrillation) ([place])   . Septic shock ([place])   . Superficial venous thrombosis of arm     Past Surgical History:  Procedure  Laterality Date  . CHOLECYSTECTOMY    . ERCP N/A 06/20/2020   Procedure: ENDOSCOPIC RETROGRADE CHOLANGIOPANCREATOGRAPHY (ERCP);  Surgeon: Milus Banister, MD;  Location: [facility];  Service: Endoscopy;  Laterality: N/A;  . SPHINCTEROTOMY  06/20/2020   Procedure: SPHINCTEROTOMY;  Surgeon: Milus Banister, MD;  Location: [facility];  Service: Endoscopy;;  Balloon Sweep-No Stones    Current  Medications: Current Meds  Medication Sig  . albuterol (VENTOLIN HFA) 108 (90 Base) MCG/ACT inhaler Inhale 2 puffs into the lungs every 4 (four) hours as needed for wheezing or shortness of breath.  Marland Kitchen apixaban (ELIQUIS) 5 MG TABS tablet Take 1 tablet (5 mg total) by mouth 2 (two) times daily.  . clonazePAM (KLONOPIN) 1 MG tablet Take 1 mg by mouth at bedtime as needed for anxiety.  . cyanocobalamin 1000 MCG tablet Take 1,000 mcg by mouth daily.  Marland Kitchen DALIRESP 250 MCG TABS Take 250 mcg by mouth daily.  . fluticasone (FLONASE) 50 MCG/ACT nasal spray Place 1 spray into both nostrils daily as needed for allergies or rhinitis.  . Fluticasone-Umeclidin-Vilant 100-62.5-25 MCG/INH AEPB Inhale 1 puff into the lungs daily.  Marland Kitchen gabapentin (NEURONTIN) 600 MG tablet Take 600 mg by mouth 2 (two) times daily.  . Iron-Folic Acid-Vit 123456 (IRON FORMULA PO) Take 81 mg by mouth 2 (two) times daily.  Marland Kitchen nystatin cream (MYCOSTATIN) Apply 1 application topically 2 (two) times daily.  Marland Kitchen omeprazole (PRILOSEC) 40 MG capsule Take 1 capsule (40 mg total) by mouth daily.  . ondansetron (ZOFRAN) 4 MG tablet Take 1 tablet (4 mg total) by mouth every 6 (six) hours as needed for nausea.  . tamsulosin (FLOMAX) 0.4 MG CAPS capsule Take 0.4 mg by mouth daily.  Marland Kitchen triamcinolone ointment (KENALOG) 0.1 % Apply 1 application topically daily as needed.  Marland Kitchen VITAMIN D, ERGOCALCIFEROL, PO Take 5,000 Units by mouth daily.    Allergies:   Patient has no known allergies.   Social History   Socioeconomic History  . Marital status: Divorced    Spouse name: Not on file  . Number of children: Not on file  . Years of education: Not on file  . Highest education level: Not on file  Occupational History  . Not on file  Tobacco Use  . Smoking status: Former Smoker    Quit date: 03/15/2015    Years since quitting: 5.3  . Smokeless tobacco: Never Used  Substance and Sexual Activity  . Alcohol use: Not Currently  . Drug use: Not on file  .  Sexual activity: Not on file  Other Topics Concern  . Not on file  Social History Narrative  . Not on file   Social Determinants of Health   Financial Resource Strain: Not on file  Food Insecurity: Not on file  Transportation Needs: Not on file  Physical Activity: Not on file  Stress: Not on file  Social Connections: Not on file     Family History:  The patient's family history is negative for Colon cancer.  ROS:   Please see the history of present illness.  All other systems are reviewed and otherwise negative.    EKGs/Labs/Other Studies Reviewed:    Studies reviewed are outlined and summarized above. Reports included below if pertinent.  2d echo 06/2020  1. Left ventricular ejection fraction, by estimation, is 50 to 55%. The  left ventricle has low normal function. The left ventricle has no regional  wall motion abnormalities. There is mild left ventricular hypertrophy.  Left ventricular  diastolic  parameters are indeterminate.  2. Right ventricular systolic function is normal. The right ventricular  size is normal. There is mildly elevated pulmonary artery systolic  pressure. The estimated right ventricular systolic pressure is 48.5 mmHg.  3. Left atrial size was mildly dilated.  4. Right atrial size was mildly dilated.  5. The mitral valve is normal in structure. Mild mitral valve  regurgitation.  6. The aortic valve is tricuspid. There is moderate calcification of the  aortic valve. Aortic valve regurgitation is not visualized. Mild to  moderate aortic valve stenosis. Vmax 2.6 m/s, MG 19 mmHg, AVA 1.5 cm^2, DI  0.47  7. The inferior vena cava is dilated in size with >50% respiratory  variability, suggesting right atrial pressure of 8 mmHg.     EKG:  EKG is ordered today, personally reviewed, demonstrating NSR 92bpm no acute STT changes  Recent Labs: 06/19/2020: TSH 0.121 06/23/2020: B Natriuretic Peptide 1,505.3 06/24/2020: ALT 22; BUN 19; Creatinine, Ser  1.07; Hemoglobin 9.5; Magnesium 1.5; Platelets 208; Potassium 3.5; Sodium 133  Recent Lipid Panel    Component Value Date/Time   CHOL 213 (H) 03/22/2007 2217   TRIG 78 03/22/2007 2217   HDL 54 03/22/2007 2217   CHOLHDL 3.9 Ratio 03/22/2007 2217   VLDL 16 03/22/2007 2217   LDLCALC 143 (H) 03/22/2007 2217    PHYSICAL EXAM:    VS:  BP 116/74   Pulse 92   Ht 5\' 11"  (1.803 m)   Wt 185 lb (83.9 kg)   SpO2 95%   BMI 25.80 kg/m   BMI: Body mass index is 25.8 kg/m.  GEN: Well nourished, well developed male in no acute distress, sallow complexion, HEENT: normocephalic, atraumatic Neck: no JVD, carotid bruits, or masses Cardiac: RRR; no murmurs, rubs, or gallops, no edema  Respiratory: coarse rhonchorous BS throughout with scattered end expiratory wheezing, normal work of breathing GI: soft, nontender, nondistended, + BS MS: no deformity but ambulates slowly with cane due to chronic back pain Skin: warm and dry, no rash, slowed capillary refill on RLE (dependent rubor), normal LLE DP pulse, decreased but still palpable on the RLE DP Neuro:  Alert and Oriented x 3, Strength and sensation are intact, follows commands Psych: euthymic mood, full affect  Wt Readings from Last 3 Encounters:  07/20/20 185 lb (83.9 kg)  06/24/20 208 lb 8.9 oz (94.6 kg)  06/02/16 198 lb (89.8 kg)     ASSESSMENT & PLAN:   1. Elevated troponin - troponin in the hospital was 1024-1169, not trended further. 2D echo showed low-normal LV function. His CT did demonstrate 3V coronary calcification. This troponin level likely reflects demand ischemia superimposed on some degree of baseline coronary artery disease. He has significant COPD and I do not think clinically he is a candidate for Lexiscan due to baseline wheezing and poor lung function. Coronary CT could be pursued but he has 3v calcification on CT scan which may make interpretation challenging, and we are also entering a challenging time of supply chain issues  with our contrast supply. If this were pursued, would consider diltiazem instead of beta blocker to lower HR. Left heart cath could be considered as an option but overall I am very concerned about his general debilitation as a whole. His well-being seems most limited by severe chronic back problems as well as his COPD. I will discuss options with Dr. Acie Fredrickson to see what he thinks. The patient wishes to pursue whatever his medical team suggests. He does not  have any specific opinions on aggressive vs conservative approach. The patient is not having any chest pain or accelerating DOE. His lung exam is c/w significant COPD with baseline wheezing. This is chronic per patient. He is not currently on ASA due to concomitant warfarin. Would avoid beta blocker therapy with his COPD. (CT scan showed severe centrilobular and paraseptal emphysema with diffuse bronchial wall thickening.) We will check CMET/lipids today and consider initiation of statin therapy. Addendum: per d/w Dr. Acie Fredrickson, his clinical impression was that the troponin elevation was likely demand ischemia at the time of his hospitalization. Given wheezing, Lexiscan may not be the best test for him and he is not a fan of dobutamine either (personally have experienced a fair complication rate) - given his frail/debilitated state, would not pursue invasive cath at this time unless he developed chest pain or clear acute worsening of his dyspnea - recommend continued medical therapy. We'll relay this info to the patient in his lab note.  2. Aortic stenosis - mild-moderate by echo. Anticipate repeat echo in 06/2021. This can be arranged closer to that time in follow-up.  3. Paroxysmal atrial fibrillation - noted in the hospital while acutely ill. He is in NSR today. Continue Eliquis for now. He is unaware of any breakthrough symptoms. Will place a 14-day Zio to assess for Afib burden. Check TSH and free T4 given recent abnormal values.  4. PAD, various sites noted  by CT imaging - per CT scan 06/2020 "Focal high-grade atherosclerotic stenosis in the right common femoral artery. 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 Approximately 50% proximal bilateral renal artery stenoses." He does not have any classic calf claudication but finds it hard to assert whether he is having any hip/buttocks pain given he also has chronic LBP as well. Will refer to one of our PV team members for their input.  5. Recent multiple lab abnormalities including hypomagnesemia, anemia, AKI, abnormal thyroid - we will recheck CMET, CBC, thyroid, Mg today.   Disposition: F/u with me or Dr. Acie Fredrickson in 8 weeks.   Medication Adjustments/Labs and Tests Ordered: Current medicines are reviewed at length with the patient today.  Concerns regarding medicines are outlined above. Medication changes, Labs and Tests ordered today are summarized above and listed in the Patient Instructions accessible in Encounters.   Signed, Charlie Pitter, PA-C  07/20/2020 11:54 AM    [address], [place], [place]  [address] Phone: [phone]; Fax: [phone]

## 2020-07-20 ENCOUNTER — Ambulatory Visit (INDEPENDENT_AMBULATORY_CARE_PROVIDER_SITE_OTHER): Payer: Medicare Other | Admitting: Physician Assistant

## 2020-07-20 ENCOUNTER — Other Ambulatory Visit: Payer: Self-pay

## 2020-07-20 ENCOUNTER — Ambulatory Visit (INDEPENDENT_AMBULATORY_CARE_PROVIDER_SITE_OTHER): Payer: Medicare Other

## 2020-07-20 ENCOUNTER — Encounter: Payer: Self-pay | Admitting: Physician Assistant

## 2020-07-20 VITALS — BP 116/74 | HR 92 | Ht 71.0 in | Wt 185.0 lb

## 2020-07-20 DIAGNOSIS — I350 Nonrheumatic aortic (valve) stenosis: Secondary | ICD-10-CM

## 2020-07-20 DIAGNOSIS — I480 Paroxysmal atrial fibrillation: Secondary | ICD-10-CM

## 2020-07-20 DIAGNOSIS — R778 Other specified abnormalities of plasma proteins: Secondary | ICD-10-CM

## 2020-07-20 DIAGNOSIS — R7989 Other specified abnormal findings of blood chemistry: Secondary | ICD-10-CM

## 2020-07-20 DIAGNOSIS — N179 Acute kidney failure, unspecified: Secondary | ICD-10-CM

## 2020-07-20 DIAGNOSIS — D649 Anemia, unspecified: Secondary | ICD-10-CM

## 2020-07-20 DIAGNOSIS — I739 Peripheral vascular disease, unspecified: Secondary | ICD-10-CM

## 2020-07-20 LAB — CBC
Hematocrit: 32.1 % — ABNORMAL LOW (ref 37.5–51.0)
Hemoglobin: 10.4 g/dL — ABNORMAL LOW (ref 13.0–17.7)
MCH: 31.6 pg (ref 26.6–33.0)
MCHC: 32.4 g/dL (ref 31.5–35.7)
MCV: 98 fL — ABNORMAL HIGH (ref 79–97)
Platelets: 298 10*3/uL (ref 150–450)
RBC: 3.29 x10E6/uL — ABNORMAL LOW (ref 4.14–5.80)
RDW: 13.1 % (ref 11.6–15.4)
WBC: 7.8 10*3/uL (ref 3.4–10.8)

## 2020-07-20 LAB — COMPREHENSIVE METABOLIC PANEL
ALT: 6 IU/L (ref 0–44)
AST: 17 IU/L (ref 0–40)
Albumin/Globulin Ratio: 1.3 (ref 1.2–2.2)
Albumin: 3.4 g/dL — ABNORMAL LOW (ref 3.7–4.7)
Alkaline Phosphatase: 116 IU/L (ref 44–121)
BUN/Creatinine Ratio: 8 — ABNORMAL LOW (ref 10–24)
BUN: 8 mg/dL (ref 8–27)
Bilirubin Total: 0.4 mg/dL (ref 0.0–1.2)
CO2: 26 mmol/L (ref 20–29)
Calcium: 9.1 mg/dL (ref 8.6–10.2)
Chloride: 97 mmol/L (ref 96–106)
Creatinine, Ser: 0.97 mg/dL (ref 0.76–1.27)
Globulin, Total: 2.7 g/dL (ref 1.5–4.5)
Glucose: 87 mg/dL (ref 65–99)
Potassium: 4.5 mmol/L (ref 3.5–5.2)
Sodium: 134 mmol/L (ref 134–144)
Total Protein: 6.1 g/dL (ref 6.0–8.5)
eGFR: 83 mL/min/{1.73_m2} (ref >59)

## 2020-07-20 LAB — MAGNESIUM: Magnesium: 1.9 mg/dL (ref 1.6–2.3)

## 2020-07-20 LAB — LIPID PANEL
Chol/HDL Ratio: 2.8 ratio (ref 0.0–5.0)
Cholesterol, Total: 212 mg/dL — ABNORMAL HIGH (ref 100–199)
HDL: 75 mg/dL (ref >39)
LDL Chol Calc (NIH): 121 mg/dL — ABNORMAL HIGH (ref 0–99)
Triglycerides: 92 mg/dL (ref 0–149)
VLDL Cholesterol Cal: 16 mg/dL (ref 5–40)

## 2020-07-20 LAB — T4, FREE: Free T4: 0.96 ng/dL (ref 0.82–1.77)

## 2020-07-20 LAB — TSH: TSH: 0.978 u[IU]/mL (ref 0.450–4.500)

## 2020-07-20 NOTE — Progress Notes (Unsigned)
Patient enrolled for [employer] to ship a 14 day ZIO XT long term holter monitor to his home. 

## 2020-07-20 NOTE — Patient Instructions (Addendum)
Medication Instructions:  Your physician recommends that you continue on your current medications as directed. Please refer to the Current Medication list given to you today.  *If you need a refill on your cardiac medications before your next appointment, please call your pharmacy*   Lab Work: TODAY:  If you have labs (blood work) drawn today and your tests are completely normal, you will receive your results only by: Marland Kitchen MyChart Message (if you have MyChart) OR . A paper copy in the mail If you have any lab test that is abnormal or we need to change your treatment, we will call you to review the results.   Testing/Procedures:  [facility]- Long Term Monitor Instructions   Your physician has requested you wear a ZIO patch monitor for _14__ days.  This is a single patch monitor.   [employer] supplies one patch monitor per enrollment. Additional stickers are not available. Please do not apply patch if you will be having a Nuclear Stress Test, Echocardiogram, Cardiac CT, MRI, or Chest Xray during the period you would be wearing the monitor. The patch cannot be worn during these tests. You cannot remove and re-apply the ZIO XT patch monitor.  Your ZIO patch monitor will be sent Fed Ex from [employer] directly to your home address. It may take 3-5 days to receive your monitor after you have been enrolled.  Once you have received your monitor, please review the enclosed instructions. Your monitor has already been registered assigning a specific monitor serial # to you.  Billing and Patient Assistance Program Information   We have supplied [employer] with any of your insurance information on file for billing purposes. [employer] offers a sliding scale Patient Assistance Program for patients that do not have insurance, or whose insurance does not completely cover the cost of the ZIO monitor.   You must apply for the Patient Assistance Program to qualify for this discounted rate.     To apply, please  call [employer] at [phone], select option 4, then select option 2, and ask to apply for Patient Assistance Program.  Theodore Demark will ask your household income, and how many people are in your household.  They will quote your out-of-pocket cost based on that information.  [employer] will also be able to set up a 16-month, interest-free payment plan if needed.  Applying the monitor   Shave hair from upper left chest.  Hold abrader disc by orange tab. Rub abrader in 40 strokes over the upper left chest as indicated in your monitor instructions.  Clean area with 4 enclosed alcohol pads. Let dry.  Apply patch as indicated in monitor instructions. Patch will be placed under collarbone on left side of chest with arrow pointing upward.  Rub patch adhesive wings for 2 minutes. Remove white label marked "1". Remove the white label marked "2". Rub patch adhesive wings for 2 additional minutes.  While looking in a mirror, press and release button in center of patch. A small green light will flash 3-4 times. This will be your only indicator that the monitor has been turned on. ?  Do not shower for the first 24 hours. You may shower after the first 24 hours.  Press the button if you feel a symptom. You will hear a small click. Record Date, Time and Symptom in the Patient Logbook.  When you are ready to remove the patch, follow instructions on the last 2 pages of the Patient Logbook. Stick patch monitor onto the last page of Patient  Logbook.  Place Patient Logbook in the blue and white box.  Use locking tab on box and tape box closed securely.  The blue and white box has prepaid postage on it. Please place it in the mailbox as soon as possible. Your physician should have your test results approximately 7 days after the monitor has been mailed back to [facility].  Call [facility] at [phone] if you have questions regarding your ZIO XT patch monitor. Call them immediately if you see an orange  light blinking on your monitor.  If your monitor falls off in less than 4 days, contact our Monitor department at [phone]. ?If your monitor becomes loose or falls off after 4 days call [employer] at [phone] for suggestions on securing your monitor.?      You have been referred to Dr. Fletcher Anon or Dr. Gwenlyn Found for PAD.      Follow-Up: At [facility], you and your health needs are our priority.  As part of our continuing mission to provide you with exceptional heart care, we have created designated Provider Care Teams.  These Care Teams include your primary Cardiologist (physician) and Advanced Practice Providers (APPs -  Physician Assistants and Nurse Practitioners) who all work together to provide you with the care you need, when you need it.  We recommend signing up for the patient portal called "MyChart".  Sign up information is provided on this After Visit Summary.  MyChart is used to connect with patients for Virtual Visits (Telemedicine).  Patients are able to view lab/test results, encounter notes, upcoming appointments, etc.  Non-urgent messages can be sent to your provider as well.   To learn more about what you can do with MyChart, go to [email or web address].    Your next appointment:   6-8 WEEKS   The format for your next appointment:   In Person  Provider:   You may see Mertie Moores, MD or one of the following Advanced Practice Providers on your designated Care Team:    Melina Copa, [place]    Other Instructions

## 2020-07-23 DIAGNOSIS — R000 Tachycardia, unspecified: Secondary | ICD-10-CM | POA: Diagnosis not present

## 2020-08-05 ENCOUNTER — Encounter (HOSPITAL_COMMUNITY): Payer: Self-pay

## 2020-08-05 ENCOUNTER — Emergency Department (HOSPITAL_COMMUNITY): Payer: Medicare Other

## 2020-08-05 ENCOUNTER — Other Ambulatory Visit: Payer: Self-pay

## 2020-08-05 ENCOUNTER — Inpatient Hospital Stay (HOSPITAL_COMMUNITY)
Admission: EM | Admit: 2020-08-05 | Discharge: 2020-08-09 | DRG: 194 | Disposition: A | Discharge status: Home / Self Care | Payer: Medicare Other | Attending: Internal Medicine | Admitting: Internal Medicine

## 2020-08-05 DIAGNOSIS — Z7901 Long term (current) use of anticoagulants: Secondary | ICD-10-CM

## 2020-08-05 DIAGNOSIS — R55 Syncope and collapse: Secondary | ICD-10-CM | POA: Diagnosis not present

## 2020-08-05 DIAGNOSIS — G8929 Other chronic pain: Secondary | ICD-10-CM | POA: Diagnosis present

## 2020-08-05 DIAGNOSIS — Z86711 Personal history of pulmonary embolism: Secondary | ICD-10-CM | POA: Diagnosis present

## 2020-08-05 DIAGNOSIS — Z79899 Other long term (current) drug therapy: Secondary | ICD-10-CM

## 2020-08-05 DIAGNOSIS — J440 Chronic obstructive pulmonary disease with acute lower respiratory infection: Secondary | ICD-10-CM | POA: Diagnosis present

## 2020-08-05 DIAGNOSIS — K219 Gastro-esophageal reflux disease without esophagitis: Secondary | ICD-10-CM | POA: Diagnosis present

## 2020-08-05 DIAGNOSIS — Z66 Do not resuscitate: Secondary | ICD-10-CM | POA: Diagnosis present

## 2020-08-05 DIAGNOSIS — I951 Orthostatic hypotension: Secondary | ICD-10-CM | POA: Diagnosis present

## 2020-08-05 DIAGNOSIS — Z87891 Personal history of nicotine dependence: Secondary | ICD-10-CM

## 2020-08-05 DIAGNOSIS — R54 Age-related physical debility: Secondary | ICD-10-CM | POA: Diagnosis present

## 2020-08-05 DIAGNOSIS — G4733 Obstructive sleep apnea (adult) (pediatric): Secondary | ICD-10-CM | POA: Diagnosis present

## 2020-08-05 DIAGNOSIS — J441 Chronic obstructive pulmonary disease with (acute) exacerbation: Secondary | ICD-10-CM | POA: Diagnosis present

## 2020-08-05 DIAGNOSIS — J180 Bronchopneumonia, unspecified organism: Secondary | ICD-10-CM | POA: Diagnosis not present

## 2020-08-05 DIAGNOSIS — I10 Essential (primary) hypertension: Secondary | ICD-10-CM | POA: Diagnosis present

## 2020-08-05 DIAGNOSIS — I2510 Atherosclerotic heart disease of native coronary artery without angina pectoris: Secondary | ICD-10-CM | POA: Diagnosis present

## 2020-08-05 DIAGNOSIS — M549 Dorsalgia, unspecified: Secondary | ICD-10-CM | POA: Diagnosis present

## 2020-08-05 DIAGNOSIS — N400 Enlarged prostate without lower urinary tract symptoms: Secondary | ICD-10-CM | POA: Diagnosis present

## 2020-08-05 DIAGNOSIS — J9611 Chronic respiratory failure with hypoxia: Secondary | ICD-10-CM | POA: Diagnosis present

## 2020-08-05 DIAGNOSIS — E876 Hypokalemia: Secondary | ICD-10-CM

## 2020-08-05 DIAGNOSIS — I739 Peripheral vascular disease, unspecified: Secondary | ICD-10-CM | POA: Diagnosis present

## 2020-08-05 DIAGNOSIS — Z20822 Contact with and (suspected) exposure to covid-19: Secondary | ICD-10-CM | POA: Diagnosis present

## 2020-08-05 DIAGNOSIS — F419 Anxiety disorder, unspecified: Secondary | ICD-10-CM | POA: Diagnosis present

## 2020-08-05 DIAGNOSIS — I350 Nonrheumatic aortic (valve) stenosis: Secondary | ICD-10-CM | POA: Diagnosis present

## 2020-08-05 DIAGNOSIS — I480 Paroxysmal atrial fibrillation: Secondary | ICD-10-CM | POA: Diagnosis present

## 2020-08-05 DIAGNOSIS — J449 Chronic obstructive pulmonary disease, unspecified: Secondary | ICD-10-CM | POA: Diagnosis present

## 2020-08-05 LAB — RESP PANEL BY RT-PCR (FLU A&B, COVID) ARPGX2
Influenza A by PCR: NEGATIVE
Influenza B by PCR: NEGATIVE
SARS Coronavirus 2 by RT PCR: NEGATIVE

## 2020-08-05 LAB — HEPATIC FUNCTION PANEL
ALT: 6 U/L (ref 0–44)
AST: 14 U/L — ABNORMAL LOW (ref 15–41)
Albumin: 2.8 g/dL — ABNORMAL LOW (ref 3.5–5.0)
Alkaline Phosphatase: 91 U/L (ref 38–126)
Bilirubin, Direct: 0.2 mg/dL (ref 0.0–0.2)
Indirect Bilirubin: 0.4 mg/dL (ref 0.3–0.9)
Total Bilirubin: 0.6 mg/dL (ref 0.3–1.2)
Total Protein: 6.5 g/dL (ref 6.5–8.1)

## 2020-08-05 LAB — TROPONIN I (HIGH SENSITIVITY)
Troponin I (High Sensitivity): 10 ng/L (ref <18)
Troponin I (High Sensitivity): 8 ng/L

## 2020-08-05 LAB — BASIC METABOLIC PANEL WITH GFR
Anion gap: 9 (ref 5–15)
BUN: 7 mg/dL — ABNORMAL LOW (ref 8–23)
CO2: 27 mmol/L (ref 22–32)
Calcium: 8.8 mg/dL — ABNORMAL LOW (ref 8.9–10.3)
Chloride: 97 mmol/L — ABNORMAL LOW (ref 98–111)
Creatinine, Ser: 1.08 mg/dL (ref 0.61–1.24)
GFR, Estimated: >60 mL/min
Glucose, Bld: 129 mg/dL — ABNORMAL HIGH (ref 70–99)
Potassium: 2.9 mmol/L — ABNORMAL LOW (ref 3.5–5.1)
Sodium: 133 mmol/L — ABNORMAL LOW (ref 135–145)

## 2020-08-05 LAB — CBC
HCT: 33.2 % — ABNORMAL LOW (ref 39.0–52.0)
Hemoglobin: 10.4 g/dL — ABNORMAL LOW (ref 13.0–17.0)
MCH: 32.2 pg (ref 26.0–34.0)
MCHC: 31.3 g/dL (ref 30.0–36.0)
MCV: 102.8 fL — ABNORMAL HIGH (ref 80.0–100.0)
Platelets: 347 10*3/uL (ref 150–400)
RBC: 3.23 MIL/uL — ABNORMAL LOW (ref 4.22–5.81)
RDW: 14.6 % (ref 11.5–15.5)
WBC: 11 10*3/uL — ABNORMAL HIGH (ref 4.0–10.5)
nRBC: 0 % (ref 0.0–0.2)

## 2020-08-05 MED ORDER — POTASSIUM CHLORIDE 10 MEQ/100ML IV SOLN
INTRAVENOUS | Status: AC
  Administered 2020-08-05: 10 meq
  Filled 2020-08-05: qty 100

## 2020-08-05 MED ORDER — POTASSIUM CHLORIDE 10 MEQ/100ML IV SOLN
10.0000 meq | Freq: Once | INTRAVENOUS | Status: AC
  Administered 2020-08-05: 10 meq via INTRAVENOUS

## 2020-08-05 MED ORDER — METOCLOPRAMIDE HCL 5 MG/ML IJ SOLN
10.0000 mg | Freq: Once | INTRAMUSCULAR | Status: AC
  Administered 2020-08-05: 10 mg via INTRAVENOUS
  Filled 2020-08-05: qty 2

## 2020-08-05 MED ORDER — ONDANSETRON HCL 4 MG/2ML IJ SOLN
4.0000 mg | Freq: Once | INTRAMUSCULAR | Status: AC
  Administered 2020-08-05: 4 mg via INTRAVENOUS
  Filled 2020-08-05: qty 2

## 2020-08-05 MED ORDER — MECLIZINE HCL 25 MG PO TABS
25.0000 mg | ORAL_TABLET | Freq: Once | ORAL | Status: AC
  Administered 2020-08-05: 25 mg via ORAL
  Filled 2020-08-05: qty 1

## 2020-08-05 MED ORDER — SODIUM CHLORIDE 0.9 % IV BOLUS
500.0000 mL | Freq: Once | INTRAVENOUS | Status: AC
  Administered 2020-08-05: 500 mL via INTRAVENOUS

## 2020-08-05 MED ORDER — POTASSIUM CHLORIDE 10 MEQ/100ML IV SOLN
10.0000 meq | Freq: Once | INTRAVENOUS | Status: AC
  Filled 2020-08-05: qty 100

## 2020-08-05 MED ORDER — LEVOFLOXACIN 500 MG PO TABS
500.0000 mg | ORAL_TABLET | Freq: Once | ORAL | Status: AC
  Administered 2020-08-05: 500 mg via ORAL
  Filled 2020-08-05: qty 1

## 2020-08-05 NOTE — ED Provider Notes (Signed)
[facility] DEPT Provider Note   CSN: [id number] Arrival date & time: 08/05/20  1737     History Chief Complaint  Patient presents with  . Chest Pain  . Loss of Consciousness    Warren [name] is a 70 y.o. male.  Patient states that he has been feeling dizzy off and on for a week and a half and then he became dizzy today and passed out.  Patient has a history of atrial fib PE COPD  The history is provided by the patient and a friend.  Chest Pain Pain location:  L chest Pain quality: aching   Pain radiates to:  Does not radiate Pain severity:  Mild Onset quality:  Gradual Timing:  Intermittent Progression:  Waxing and waning Chronicity:  New Context: not breathing   Relieved by:  Nothing Associated symptoms: dizziness and syncope   Associated symptoms: no abdominal pain, no back pain, no cough, no fatigue and no headache   Loss of Consciousness Associated symptoms: chest pain and dizziness   Associated symptoms: no headaches and no seizures        Past Medical History:  Diagnosis Date  . Allergy   . Anemia   . Aortic stenosis   . COPD (chronic obstructive pulmonary disease) ([place])   . Elevated troponin   . GERD (gastroesophageal reflux disease)   . History of pulmonary embolus (PE)   . Hypertension   . Insomnia   . OSA (obstructive sleep apnea)   . PAD (peripheral artery disease) ([place])    by CT imaging  . PAF (paroxysmal atrial fibrillation) ([place])   . Septic shock ([place])   . Superficial venous thrombosis of arm     Patient Active Problem List   Diagnosis Date Noted  . Atrial fibrillation with rapid ventricular response ([place]) 06/19/2020  . Septic shock ([place]) 06/19/2020  . Choledocholithiasis with obstruction 06/18/2020  . GERD (gastroesophageal reflux disease) 06/18/2020  . S/P cholecystectomy 06/18/2020  . History of pancreatitis 06/18/2020  . History of pulmonary embolism 06/18/2020  . COPD (chronic obstructive pulmonary  disease) ([place]) 06/18/2020  . Cholangitis   . ANXIETY 03/22/2007  . TOBACCO ABUSE 03/22/2007  . COPD 03/22/2007  . GERD 03/22/2007  . TRANSAMINASES, SERUM, ELEVATED 03/22/2007  . PANCREATITIS, HX OF 03/22/2007    Past Surgical History:  Procedure Laterality Date  . CHOLECYSTECTOMY    . ERCP N/A 06/20/2020   Procedure: ENDOSCOPIC RETROGRADE CHOLANGIOPANCREATOGRAPHY (ERCP);  Surgeon: Milus Banister, MD;  Location: [facility];  Service: Endoscopy;  Laterality: N/A;  . SPHINCTEROTOMY  06/20/2020   Procedure: SPHINCTEROTOMY;  Surgeon: Milus Banister, MD;  Location: [facility];  Service: Endoscopy;;  Balloon Sweep-No Stones       Family History  Problem Relation Age of Onset  . Colon cancer Neg Hx     Social History   Tobacco Use  . Smoking status: Former Smoker    Quit date: 03/15/2015    Years since quitting: 5.3  . Smokeless tobacco: Never Used  Substance Use Topics  . Alcohol use: Not Currently    Home Medications Prior to Admission medications   Medication Sig Start Date End Date Taking? Authorizing Provider  albuterol (VENTOLIN HFA) 108 (90 Base) MCG/ACT inhaler Inhale 2 puffs into the lungs every 4 (four) hours as needed for wheezing or shortness of breath. 06/03/20  Yes [provider]  apixaban (ELIQUIS) 5 MG TABS tablet Take 1 tablet (5 mg total) by mouth 2 (two) times daily.  06/25/20  Yes Pokhrel, Laxman, MD  DALIRESP 250 MCG TABS Take 250 mcg by mouth daily. 06/17/20  Yes [provider]  donepezil (ARICEPT) 10 MG tablet Take 10 mg by mouth daily. 08/03/20  Yes [provider]  fluticasone (FLONASE) 50 MCG/ACT nasal spray Place 1 spray into both nostrils daily as needed for allergies or rhinitis. 04/08/20  Yes [provider]  Fluticasone-Umeclidin-Vilant 100-62.5-25 MCG/INH AEPB Inhale 1 puff into the lungs daily.   Yes [provider]  gabapentin (NEURONTIN) 600 MG tablet Take 600 mg by mouth 2 (two) times daily. 07/06/20   Yes [provider]  Iron-Folic Acid-Vit W29 (IRON FORMULA PO) Take 81 mg by mouth 2 (two) times daily.   Yes [provider]  omeprazole (PRILOSEC) 40 MG capsule Take 1 capsule (40 mg total) by mouth daily. 11/21/16  Yes Armbruster, Carlota Raspberry, MD  ondansetron (ZOFRAN) 4 MG tablet Take 1 tablet (4 mg total) by mouth every 6 (six) hours as needed for nausea. 06/25/20  Yes Pokhrel, Laxman, MD  Oxycodone HCl 10 MG TABS Take 10 mg by mouth 4 (four) times daily as needed (pain). 07/20/20  Yes [provider]  tamsulosin (FLOMAX) 0.4 MG CAPS capsule Take 0.4 mg by mouth daily. 03/18/16  Yes [provider]  VITAMIN D, ERGOCALCIFEROL, PO Take 5,000 Units by mouth daily.   Yes [provider]  cyanocobalamin 1000 MCG tablet Take 1,000 mcg by mouth daily. Patient not taking: Reported on 08/05/2020    [provider]    Allergies    Patient has no known allergies.  Review of Systems   Review of Systems  Constitutional: Negative for appetite change and fatigue.  HENT: Negative for congestion, ear discharge and sinus pressure.   Eyes: Negative for discharge.  Respiratory: Negative for cough.   Cardiovascular: Positive for chest pain and syncope.  Gastrointestinal: Negative for abdominal pain and diarrhea.  Genitourinary: Negative for frequency and hematuria.  Musculoskeletal: Negative for back pain.  Skin: Negative for rash.  Neurological: Positive for dizziness. Negative for seizures and headaches.  Psychiatric/Behavioral: Negative for hallucinations.    Physical Exam Updated Vital Signs BP (!) 145/69   Pulse 83   Temp 98.6 F (37 C) (Oral)   Resp (!) 22   Ht 5\' 11"  (1.803 m)   Wt 86.2 kg   SpO2 96%   BMI 26.50 kg/m   Physical Exam Vitals and nursing note reviewed.  Constitutional:      Appearance: He is well-developed.  HENT:     Head: Normocephalic.     Nose: Nose normal.  Eyes:     General: No scleral icterus.     Conjunctiva/sclera: Conjunctivae normal.  Neck:     Thyroid: No thyromegaly.  Cardiovascular:     Rate and Rhythm: Normal rate and regular rhythm.     Heart sounds: No murmur heard. No friction rub. No gallop.   Pulmonary:     Breath sounds: No stridor. No wheezing or rales.  Chest:     Chest wall: No tenderness.  Abdominal:     General: There is no distension.     Tenderness: There is no abdominal tenderness. There is no rebound.  Musculoskeletal:        General: Normal range of motion.     Cervical back: Neck supple.  Lymphadenopathy:     Cervical: No cervical adenopathy.  Skin:    Findings: No erythema or rash.  Neurological:     Mental Status:  He is alert and oriented to person, place, and time.     Motor: No abnormal muscle tone.     Coordination: Coordination normal.  Psychiatric:        Behavior: Behavior normal.     ED Results / Procedures / Treatments   Labs (all labs ordered are listed, but only abnormal results are displayed) Labs Reviewed  BASIC METABOLIC PANEL - Abnormal; Notable for the following components:      Result Value   Sodium 133 (*)    Potassium 2.9 (*)    Chloride 97 (*)    Glucose, Bld 129 (*)    BUN 7 (*)    Calcium 8.8 (*)    All other components within normal limits  CBC - Abnormal; Notable for the following components:   WBC 11.0 (*)    RBC 3.23 (*)    Hemoglobin 10.4 (*)    HCT 33.2 (*)    MCV 102.8 (*)    All other components within normal limits  HEPATIC FUNCTION PANEL - Abnormal; Notable for the following components:   Albumin 2.8 (*)    AST 14 (*)    All other components within normal limits  TROPONIN I (HIGH SENSITIVITY)  TROPONIN I (HIGH SENSITIVITY)    EKG EKG Interpretation  Date/Time:  Wednesday Aug 05 2020 17:50:36 EDT Ventricular Rate:  97 PR Interval:  161 QRS Duration: 82 QT Interval:  344 QTC Calculation: 440 R Axis:   51 Text Interpretation: Sinus rhythm LVH by voltage Borderline T abnormalities,  anterior leads 12 Lead; [place] Confirmed by Milton Ferguson [phone]) on 08/05/2020 7:52:19 PM   Radiology DG Chest 2 View  Result Date: 08/05/2020 CLINICAL DATA:  Chest pain, intermittent sharp chest pain for 2-3 days with increased shortness of breath. EXAM: CHEST - 2 VIEW COMPARISON:  June 23, 2020. FINDINGS: Electronic device projects over LEFT chest. EKG leads over the chest. Signs of increased interstitial markings and pulmonary emphysema more pronounced interstitial changes and bronchial wall thickening than on previous imaging. Trachea midline. Cardiomediastinal contours and hilar structures are stable. No sign of effusion. Slight increased density in the LEFT retrocardiac region waxing and waning compared to previous studies. On limited assessment no acute skeletal process. IMPRESSION: Findings of suspected increased bronchial wall thickening may represent bronchopneumonia/bronchitis. Improved aeration still with persistent opacity at the LEFT lung base. Correlate with any risk for aspiration and consider follow-up PA and lateral chest radiograph to ensure resolution Electronically Signed   By: Zetta Bills M.D.   On: 08/05/2020 18:37   CT Head Wo Contrast  Result Date: 08/05/2020 CLINICAL DATA:  Loss of consciousness. EXAM: CT HEAD WITHOUT CONTRAST TECHNIQUE: Contiguous axial images were obtained from the base of the skull through the vertex without intravenous contrast. COMPARISON:  None. FINDINGS: Brain: No evidence of acute infarction, hemorrhage, hydrocephalus, extra-axial collection or mass lesion/mass effect. Vascular: No hyperdense vessel or unexpected calcification. Skull: Normal. Negative for fracture or focal lesion. Sinuses/Orbits: No acute finding. Other: None. IMPRESSION: No acute intracranial abnormality seen. Electronically Signed   By: Marijo Conception M.D.   On: 08/05/2020 19:17    Procedures Procedures   Medications Ordered in ED Medications  ondansetron (ZOFRAN)  injection 4 mg (4 mg Intravenous Given 08/05/20 1824)  meclizine (ANTIVERT) tablet 25 mg (25 mg Oral Given 08/05/20 1827)  sodium chloride 0.9 % bolus 500 mL (0 mLs Intravenous Stopped 08/05/20 2025)  metoCLOPramide (REGLAN) injection 10 mg (10 mg Intravenous Given 08/05/20 2033)  potassium  chloride 10 mEq in 100 mL IVPB (10 mEq Intravenous New Bag/Given 08/05/20 2152)  potassium chloride 10 mEq in 100 mL IVPB (10 mEq Intravenous New Bag/Given 08/05/20 2038)    ED Course  I have reviewed the triage vital signs and the nursing notes.  Pertinent labs & imaging results that were available during my care of the patient were reviewed by me and considered in my medical decision making (see chart for details).  Patient is hypokalemic and possible pneumonia on x-ray.  He is also still dizzy and will be admitted to medicine MDM Rules/Calculators/A&P                          Patient with vertigo symptoms syncope bronchopneumonia and hypokalemia.  He will be admitted to medicine Final Clinical Impression(s) / ED Diagnoses Final diagnoses:  None    Rx / DC Orders ED Discharge Orders    None       Milton Ferguson, MD 08/11/20 1239

## 2020-08-05 NOTE — ED Triage Notes (Signed)
Pt reports intermittent sharp chest pain x2-3 days with increased SHOB. Pt had heart monitior placed a little over a week ago. Pt normally on 3L O2. Pt also reports episode of LOC earlier today but denies hitting his head or any injury.

## 2020-08-05 NOTE — ED Notes (Signed)
Pt ambulated to the restroom gait steady with cane.

## 2020-08-06 ENCOUNTER — Observation Stay (HOSPITAL_COMMUNITY): Payer: Medicare Other

## 2020-08-06 DIAGNOSIS — R55 Syncope and collapse: Secondary | ICD-10-CM | POA: Diagnosis present

## 2020-08-06 DIAGNOSIS — J9611 Chronic respiratory failure with hypoxia: Secondary | ICD-10-CM | POA: Diagnosis present

## 2020-08-06 DIAGNOSIS — G4733 Obstructive sleep apnea (adult) (pediatric): Secondary | ICD-10-CM | POA: Diagnosis present

## 2020-08-06 DIAGNOSIS — I10 Essential (primary) hypertension: Secondary | ICD-10-CM | POA: Diagnosis present

## 2020-08-06 DIAGNOSIS — J441 Chronic obstructive pulmonary disease with (acute) exacerbation: Secondary | ICD-10-CM | POA: Diagnosis present

## 2020-08-06 DIAGNOSIS — E876 Hypokalemia: Secondary | ICD-10-CM | POA: Diagnosis present

## 2020-08-06 DIAGNOSIS — J439 Emphysema, unspecified: Secondary | ICD-10-CM

## 2020-08-06 DIAGNOSIS — N400 Enlarged prostate without lower urinary tract symptoms: Secondary | ICD-10-CM | POA: Diagnosis present

## 2020-08-06 DIAGNOSIS — F419 Anxiety disorder, unspecified: Secondary | ICD-10-CM | POA: Diagnosis present

## 2020-08-06 DIAGNOSIS — J180 Bronchopneumonia, unspecified organism: Secondary | ICD-10-CM | POA: Diagnosis present

## 2020-08-06 DIAGNOSIS — K219 Gastro-esophageal reflux disease without esophagitis: Secondary | ICD-10-CM | POA: Diagnosis present

## 2020-08-06 DIAGNOSIS — Z79899 Other long term (current) drug therapy: Secondary | ICD-10-CM | POA: Diagnosis not present

## 2020-08-06 DIAGNOSIS — I480 Paroxysmal atrial fibrillation: Secondary | ICD-10-CM | POA: Diagnosis present

## 2020-08-06 DIAGNOSIS — G8929 Other chronic pain: Secondary | ICD-10-CM | POA: Diagnosis present

## 2020-08-06 DIAGNOSIS — M549 Dorsalgia, unspecified: Secondary | ICD-10-CM | POA: Diagnosis present

## 2020-08-06 DIAGNOSIS — I951 Orthostatic hypotension: Secondary | ICD-10-CM | POA: Diagnosis present

## 2020-08-06 DIAGNOSIS — Z20822 Contact with and (suspected) exposure to covid-19: Secondary | ICD-10-CM | POA: Diagnosis present

## 2020-08-06 DIAGNOSIS — I350 Nonrheumatic aortic (valve) stenosis: Secondary | ICD-10-CM | POA: Diagnosis present

## 2020-08-06 DIAGNOSIS — Z66 Do not resuscitate: Secondary | ICD-10-CM | POA: Diagnosis present

## 2020-08-06 DIAGNOSIS — Z86711 Personal history of pulmonary embolism: Secondary | ICD-10-CM | POA: Diagnosis not present

## 2020-08-06 DIAGNOSIS — J440 Chronic obstructive pulmonary disease with acute lower respiratory infection: Secondary | ICD-10-CM | POA: Diagnosis present

## 2020-08-06 DIAGNOSIS — I2510 Atherosclerotic heart disease of native coronary artery without angina pectoris: Secondary | ICD-10-CM | POA: Diagnosis present

## 2020-08-06 DIAGNOSIS — Z7901 Long term (current) use of anticoagulants: Secondary | ICD-10-CM | POA: Diagnosis not present

## 2020-08-06 DIAGNOSIS — Z87891 Personal history of nicotine dependence: Secondary | ICD-10-CM | POA: Diagnosis not present

## 2020-08-06 DIAGNOSIS — R54 Age-related physical debility: Secondary | ICD-10-CM | POA: Diagnosis present

## 2020-08-06 DIAGNOSIS — I739 Peripheral vascular disease, unspecified: Secondary | ICD-10-CM | POA: Diagnosis present

## 2020-08-06 LAB — BASIC METABOLIC PANEL
Anion gap: 7 (ref 5–15)
BUN: 6 mg/dL — ABNORMAL LOW (ref 8–23)
CO2: 26 mmol/L (ref 22–32)
Calcium: 8.2 mg/dL — ABNORMAL LOW (ref 8.9–10.3)
Chloride: 102 mmol/L (ref 98–111)
Creatinine, Ser: 0.88 mg/dL (ref 0.61–1.24)
GFR, Estimated: >60 mL/min (ref >60)
Glucose, Bld: 92 mg/dL (ref 70–99)
Potassium: 3.5 mmol/L (ref 3.5–5.1)
Sodium: 135 mmol/L (ref 135–145)

## 2020-08-06 LAB — CBC
HCT: 27.9 % — ABNORMAL LOW (ref 39.0–52.0)
Hemoglobin: 8.8 g/dL — ABNORMAL LOW (ref 13.0–17.0)
MCH: 31.9 pg (ref 26.0–34.0)
MCHC: 31.5 g/dL (ref 30.0–36.0)
MCV: 101.1 fL — ABNORMAL HIGH (ref 80.0–100.0)
Platelets: 290 10*3/uL (ref 150–400)
RBC: 2.76 MIL/uL — ABNORMAL LOW (ref 4.22–5.81)
RDW: 14.6 % (ref 11.5–15.5)
WBC: 9.8 10*3/uL (ref 4.0–10.5)
nRBC: 0 % (ref 0.0–0.2)

## 2020-08-06 LAB — D-DIMER, QUANTITATIVE: D-Dimer, Quant: 0.48 ug/mL-FEU (ref 0.00–0.50)

## 2020-08-06 LAB — MAGNESIUM: Magnesium: 1.8 mg/dL (ref 1.7–2.4)

## 2020-08-06 LAB — PROCALCITONIN: Procalcitonin: <0.1 ng/mL

## 2020-08-06 MED ORDER — POTASSIUM CHLORIDE CRYS ER 20 MEQ PO TBCR
40.0000 meq | EXTENDED_RELEASE_TABLET | Freq: Once | ORAL | Status: AC
  Administered 2020-08-06: 40 meq via ORAL
  Filled 2020-08-06: qty 2

## 2020-08-06 MED ORDER — APIXABAN 5 MG PO TABS
5.0000 mg | ORAL_TABLET | Freq: Two times a day (BID) | ORAL | Status: DC
  Administered 2020-08-06 – 2020-08-09 (×8): 5 mg via ORAL
  Filled 2020-08-06 (×9): qty 1

## 2020-08-06 MED ORDER — AZITHROMYCIN 250 MG PO TABS
250.0000 mg | ORAL_TABLET | Freq: Every day | ORAL | Status: DC
  Administered 2020-08-06 – 2020-08-08 (×3): 250 mg via ORAL
  Filled 2020-08-06 (×3): qty 1

## 2020-08-06 MED ORDER — ALBUTEROL SULFATE HFA 108 (90 BASE) MCG/ACT IN AERS: 2.0000 | INHALATION_SPRAY | RESPIRATORY_TRACT | Status: DC | PRN

## 2020-08-06 MED ORDER — GABAPENTIN 300 MG PO CAPS
600.0000 mg | ORAL_CAPSULE | Freq: Two times a day (BID) | ORAL | Status: DC
  Administered 2020-08-06 – 2020-08-09 (×8): 600 mg via ORAL
  Filled 2020-08-06 (×8): qty 2

## 2020-08-06 MED ORDER — TAMSULOSIN HCL 0.4 MG PO CAPS
0.4000 mg | ORAL_CAPSULE | Freq: Every day | ORAL | Status: DC
  Administered 2020-08-06 – 2020-08-09 (×4): 0.4 mg via ORAL
  Filled 2020-08-06 (×4): qty 1

## 2020-08-06 MED ORDER — SODIUM CHLORIDE 0.9 % IV SOLN: INTRAVENOUS | Status: DC

## 2020-08-06 MED ORDER — DONEPEZIL HCL 10 MG PO TABS
10.0000 mg | ORAL_TABLET | Freq: Every day | ORAL | Status: DC
  Administered 2020-08-06 – 2020-08-07 (×2): 10 mg via ORAL
  Filled 2020-08-06 (×3): qty 1

## 2020-08-06 MED ORDER — FLUTICASONE FUROATE-VILANTEROL 100-25 MCG/INH IN AEPB
1.0000 | INHALATION_SPRAY | Freq: Every day | RESPIRATORY_TRACT | Status: DC
  Administered 2020-08-07 – 2020-08-09 (×3): 1 via RESPIRATORY_TRACT
  Filled 2020-08-06: qty 28

## 2020-08-06 MED ORDER — OXYCODONE HCL 5 MG PO TABS
10.0000 mg | ORAL_TABLET | Freq: Four times a day (QID) | ORAL | Status: DC | PRN
  Administered 2020-08-08 – 2020-08-09 (×3): 10 mg via ORAL
  Filled 2020-08-06 (×3): qty 2

## 2020-08-06 MED ORDER — ACETAMINOPHEN 325 MG PO TABS
650.0000 mg | ORAL_TABLET | Freq: Four times a day (QID) | ORAL | Status: DC | PRN
  Administered 2020-08-06: 650 mg via ORAL
  Filled 2020-08-06: qty 2

## 2020-08-06 MED ORDER — SODIUM CHLORIDE 0.9 % IV SOLN
1.0000 g | INTRAVENOUS | Status: DC
  Administered 2020-08-06 – 2020-08-08 (×3): 1 g via INTRAVENOUS
  Filled 2020-08-06: qty 10
  Filled 2020-08-06 (×2): qty 1
  Filled 2020-08-06: qty 10

## 2020-08-06 MED ORDER — ROFLUMILAST 500 MCG PO TABS
250.0000 ug | ORAL_TABLET | Freq: Every day | ORAL | Status: DC
  Administered 2020-08-06 – 2020-08-09 (×4): 250 ug via ORAL
  Filled 2020-08-06 (×5): qty 1

## 2020-08-06 MED ORDER — ALBUTEROL SULFATE (2.5 MG/3ML) 0.083% IN NEBU
2.5000 mg | INHALATION_SOLUTION | Freq: Four times a day (QID) | RESPIRATORY_TRACT | Status: DC | PRN
  Administered 2020-08-06: 2.5 mg via RESPIRATORY_TRACT
  Filled 2020-08-06: qty 3

## 2020-08-06 MED ORDER — UMECLIDINIUM BROMIDE 62.5 MCG/INH IN AEPB
1.0000 | INHALATION_SPRAY | Freq: Every day | RESPIRATORY_TRACT | Status: DC
  Administered 2020-08-07 – 2020-08-09 (×3): 1 via RESPIRATORY_TRACT
  Filled 2020-08-06: qty 7

## 2020-08-06 MED ORDER — PANTOPRAZOLE SODIUM 40 MG PO TBEC
40.0000 mg | DELAYED_RELEASE_TABLET | Freq: Every day | ORAL | Status: DC
  Administered 2020-08-06 – 2020-08-09 (×4): 40 mg via ORAL
  Filled 2020-08-06 (×4): qty 1

## 2020-08-06 MED ORDER — LEVOFLOXACIN 750 MG PO TABS: 750.0000 mg | ORAL_TABLET | Freq: Every day | ORAL | Status: DC

## 2020-08-06 MED ORDER — ACETAMINOPHEN 650 MG RE SUPP: 650.0000 mg | Freq: Four times a day (QID) | RECTAL | Status: DC | PRN

## 2020-08-06 MED ORDER — FLUTICASONE-UMECLIDIN-VILANT 100-62.5-25 MCG/INH IN AEPB: 1.0000 | INHALATION_SPRAY | Freq: Every day | RESPIRATORY_TRACT | Status: DC

## 2020-08-06 MED ORDER — FLUTICASONE PROPIONATE 50 MCG/ACT NA SUSP
1.0000 | Freq: Every day | NASAL | Status: DC | PRN
  Filled 2020-08-06: qty 16

## 2020-08-06 MED ORDER — METOCLOPRAMIDE HCL 5 MG/ML IJ SOLN
10.0000 mg | Freq: Three times a day (TID) | INTRAMUSCULAR | Status: DC | PRN
  Administered 2020-08-07 – 2020-08-09 (×2): 10 mg via INTRAVENOUS
  Filled 2020-08-06 (×2): qty 2

## 2020-08-06 NOTE — Evaluation (Signed)
Physical Therapy Evaluation Patient Details Name: Warren [name] MRN: [id number] DOB: 02/24/1951 Today's Date: 08/06/2020   History of Present Illness  Pt is 70 yo male who presented with SOB, dizziness, and syncopal episode on 08/05/20.  He was admitted with syncope, hypokalemia, CAP, and nausea. Pt had 2 week hx of nausea - poor appetite and dizziness/lightheadedness.  He had admission in 06/8544 for shock complicated by afib.  PMH includes COPD, CAD, aortic stenosis, PAD, GERD, HTN, pancreatitis, PE, BPH, anxiety, OSA, chronic back pain.    Clinical Impression  Pt admitted with above diagnosis. He was able to transfer and ambulate safely at baseline level.  Pt had no episodes of dizziness or lightheadedness and orthostatic BP were negative with PT.  Pt's symptoms not consistent with vestibular cause as they occurred with nausea leading to decreased intake and when up and walking.  No symptoms with head turns or rolling.  Pt is at baseline. Recommend ambulation with nursing staff due to lines/leads.  No further PT indicated.    Follow Up Recommendations No PT follow up    Equipment Recommendations  None recommended by PT    Recommendations for Other Services       Precautions / Restrictions Precautions Precautions: Fall      Mobility  Bed Mobility Overal bed mobility: Independent                  Transfers Overall transfer level: Needs assistance Equipment used: Straight cane Transfers: Sit to/from Stand Sit to Stand: Supervision         General transfer comment: close supervision for safety  Ambulation/Gait Ambulation/Gait assistance: Supervision;Min guard Gait Distance (Feet): 300 Feet Assistive device: Straight cane Gait Pattern/deviations: WFL(Within Functional Limits) Gait velocity: normal   General Gait Details: Min guard progressed to supervision.  Steady gait, no LOB  Stairs            Wheelchair Mobility    Modified Rankin (Stroke Patients  Only)       Balance Overall balance assessment: Modified Independent                                           Pertinent Vitals/Pain Pain Assessment: No/denies pain    Home Living Family/patient expects to be discharged to:: Private residence Living Arrangements: Children (son and granddaughter) Available Help at Discharge: Family;Available 24 hours/day Type of Home: Mobile home Home Access: Stairs to enter Entrance Stairs-Rails: Right;Left;Can reach both Entrance Stairs-Number of Steps: 4 Home Layout: One level Home Equipment: Cane - single point;Walker - 2 wheels Additional Comments: 3 L O2 at home all times    Prior Function Level of Independence: Needs assistance   Gait / Transfers Assistance Needed: Walks with cane at times due to some dizziness and back pain; Could ambulate in community  ADL's / Homemaking Assistance Needed: Pt was independent with ADLs, IADLs, driving, and shopping  Comments: Has had a few recent recent falls most recently due to syncope     Hand Dominance        Extremity/Trunk Assessment   Upper Extremity Assessment Upper Extremity Assessment: Overall WFL for tasks assessed    Lower Extremity Assessment Lower Extremity Assessment: Overall WFL for tasks assessed    Cervical / Trunk Assessment Cervical / Trunk Assessment: Other exceptions (does have chronic back pain)  Communication   Communication: No difficulties  Cognition Arousal/Alertness: Awake/alert  Behavior During Therapy: WFL for tasks assessed/performed Overall Cognitive Status: Within Functional Limits for tasks assessed                                        General Comments General comments (skin integrity, edema, etc.): Pt on 3 L O2 at home. Sats 95% rest 90% ambulation with mild DOE.  BP was 159/81 supine, 156/72 sitting, and 134/71 standing.  Pt asymptomatic and had no episodes of dizziness/lightheadedness. Discussed pt's dizziness that  has been occurring past 2 weeks.  He described as unsteady, sometimes spinning, and sometimes syncopal.  Usually occured with standing or up and walking.  States did not occur with head movements or rolling.  Symptoms consistent with orthostatic hypotension at home as pt with decreased intake and nausea past couple weeks(potassium and fluids have been repleinshed since admission, pt did receive dose of meclazine yesterday).  Does not appear to be vestibular related. Educated on safety with orthostatic hypotension - staying hydrated, slow transfers, exercises to increase blood flow before standing, standing and weight shifting/marching in place before ambulation .    Exercises     Assessment/Plan    PT Assessment Patent does not need any further PT services  PT Problem List         PT Treatment Interventions      PT Goals (Current goals can be found in the Care Plan section)  Acute Rehab PT Goals PT Goal Formulation: All assessment and education complete, DC therapy    Frequency     Barriers to discharge        Co-evaluation               AM-PAC PT "6 Clicks" Mobility  Outcome Measure Help needed turning from your back to your side while in a flat bed without using bedrails?: None Help needed moving from lying on your back to sitting on the side of a flat bed without using bedrails?: None Help needed moving to and from a bed to a chair (including a wheelchair)?: A Little Help needed standing up from a chair using your arms (e.g., wheelchair or bedside chair)?: A Little Help needed to walk in hospital room?: A Little Help needed climbing 3-5 steps with a railing? : A Little 6 Click Score: 20    End of Session Equipment Utilized During Treatment: Gait belt Activity Tolerance: Patient tolerated treatment well Patient left: in bed;with call bell/phone within reach;with bed alarm set Nurse Communication: Mobility status;Other (comment) (negative orthostatic hypotension)       Time: 9417-4081 PT Time Calculation (min) (ACUTE ONLY): 30 min   Charges:     PT Treatments $Therapeutic Activity: 8-22 mins        Abran Richard, PT Acute Rehab Services Pager [phone] [facility] [place] 08/06/2020, 5:52 PM

## 2020-08-06 NOTE — H&P (Signed)
History and Physical    Warren [name] JJO:[id number] DOB: [birth year] DOA: 08/05/2020  PCP: Imagene Riches, NP Patient coming from: Home  Chief Complaint: Syncope  HPI: Warren [name] is a 70 y.o. male with medical history significant of COPD with chronic respiratory failure on 3 L home oxygen nightly, CAD, mild to moderate aortic stenosis seen on echo done in April 2022, PAD, GERD, hypertension, pancreatitis, PE in 2018, BPH, anxiety, OSA, chronic back pain.  Admitted in April 2022 for septic shock secondary to cholangitis/Klebsiella bacteremia/choledocholithiasis.  His hospital course complicated by new onset A. fib and was started on Eliquis.  Patient presents to the ED with complaints of chest pain, shortness of breath, dizziness, and a syncopal episode.  In the ED, afebrile.  Not tachycardic.  Satting well on 3 L home oxygen.  Labs showing WBC 11.0, hemoglobin 10.4 (stable), platelet count 347K.  Sodium 133, potassium 2.9, chloride 97, bicarb 27, BUN 7, creatinine 1.0, glucose 129.  No elevation of LFTs.  High-sensitivity troponin negative x2.  EKG without acute ischemic changes.  COVID and influenza PCR negative.  Chest x-ray showing findings concerning for bronchopneumonia/bronchitis and persistent opacity at the left lung base.  Head CT negative for acute intracranial abnormality. Medications administered include Levaquin, meclizine, metoclopramide, ondansetron, IV potassium 10 meq x2, and 500 cc normal saline bolus.  Patient states for the past 2 weeks he has had an upset stomach and poor appetite.  He is feeling dizzy/lightheaded especially when going from sitting to standing position and passed out yesterday.  Denies room spinning sensation.  He continues to feel nauseous but has not had any episodes of vomiting.  No abdominal pain or diarrhea.  Since he is not eating much, he is not having regular bowel movements although did have one yesterday.  He has also had some sharp pain in his lower  chest on both sides.  Reports chronic shortness of breath due to COPD, no recent change.  Also reports cough productive of sputum.  No other complaints.  Reports compliance with Eliquis.  Review of Systems:  All systems reviewed and apart from history of presenting illness, are negative.  Past Medical History:  Diagnosis Date  . Allergy   . Anemia   . Aortic stenosis   . COPD (chronic obstructive pulmonary disease) ([place])   . Elevated troponin   . GERD (gastroesophageal reflux disease)   . History of pulmonary embolus (PE)   . Hypertension   . Insomnia   . OSA (obstructive sleep apnea)   . PAD (peripheral artery disease) ([place])    by CT imaging  . PAF (paroxysmal atrial fibrillation) ([place])   . Septic shock ([place])   . Superficial venous thrombosis of arm     Past Surgical History:  Procedure Laterality Date  . CHOLECYSTECTOMY    . ERCP N/A 06/20/2020   Procedure: ENDOSCOPIC RETROGRADE CHOLANGIOPANCREATOGRAPHY (ERCP);  Surgeon: Milus Banister, MD;  Location: [facility];  Service: Endoscopy;  Laterality: N/A;  . SPHINCTEROTOMY  06/20/2020   Procedure: SPHINCTEROTOMY;  Surgeon: Milus Banister, MD;  Location: [facility];  Service: Endoscopy;;  Balloon Sweep-No Stones     reports that he quit smoking about 5 years ago. He has never used smokeless tobacco. He reports previous alcohol use. No history on file for drug use.  No Known Allergies  Family History  Problem Relation Age of Onset  . Colon cancer Neg Hx     Prior to Admission medications   Medication  Sig Start Date End Date Taking? Authorizing Provider  albuterol (VENTOLIN HFA) 108 (90 Base) MCG/ACT inhaler Inhale 2 puffs into the lungs every 4 (four) hours as needed for wheezing or shortness of breath. 06/03/20  Yes [provider]  apixaban (ELIQUIS) 5 MG TABS tablet Take 1 tablet (5 mg total) by mouth 2 (two) times daily. 06/25/20  Yes Pokhrel, Laxman, MD  DALIRESP 250 MCG TABS Take 250 mcg by mouth daily.  06/17/20  Yes [provider]  donepezil (ARICEPT) 10 MG tablet Take 10 mg by mouth daily. 08/03/20  Yes [provider]  fluticasone (FLONASE) 50 MCG/ACT nasal spray Place 1 spray into both nostrils daily as needed for allergies or rhinitis. 04/08/20  Yes [provider]  Fluticasone-Umeclidin-Vilant 100-62.5-25 MCG/INH AEPB Inhale 1 puff into the lungs daily.   Yes [provider]  gabapentin (NEURONTIN) 600 MG tablet Take 600 mg by mouth 2 (two) times daily. 07/06/20  Yes [provider]  Iron-Folic Acid-Vit X44 (IRON FORMULA PO) Take 81 mg by mouth 2 (two) times daily.   Yes [provider]  omeprazole (PRILOSEC) 40 MG capsule Take 1 capsule (40 mg total) by mouth daily. 11/21/16  Yes Armbruster, Carlota Raspberry, MD  ondansetron (ZOFRAN) 4 MG tablet Take 1 tablet (4 mg total) by mouth every 6 (six) hours as needed for nausea. 06/25/20  Yes Pokhrel, Laxman, MD  Oxycodone HCl 10 MG TABS Take 10 mg by mouth 4 (four) times daily as needed (pain). 07/20/20  Yes [provider]  tamsulosin (FLOMAX) 0.4 MG CAPS capsule Take 0.4 mg by mouth daily. 03/18/16  Yes [provider]  VITAMIN D, ERGOCALCIFEROL, PO Take 5,000 Units by mouth daily.   Yes [provider]  cyanocobalamin 1000 MCG tablet Take 1,000 mcg by mouth daily. Patient not taking: Reported on 08/05/2020    [provider]    Physical Exam: Vitals:   08/05/20 2030 08/05/20 2105 08/05/20 2130 08/05/20 2200  BP: (!) 128/116 (!) 139/103 (!) 145/69 138/64  Pulse: 77 81 83 82  Resp: (!) 25 18 (!) 22 19  Temp:      TempSrc:      SpO2: 96% 100% 96% 96%  Weight:      Height:        Physical Exam Constitutional:      General: He is not in acute distress. HENT:     Head: Normocephalic and atraumatic.  Eyes:     Extraocular Movements: Extraocular movements intact.     Conjunctiva/sclera: Conjunctivae normal.  Cardiovascular:     Rate and Rhythm: Normal rate and  regular rhythm.     Pulses: Normal pulses.  Pulmonary:     Effort: Pulmonary effort is normal. No respiratory distress.     Breath sounds: Wheezing present. No rales.     Comments: Diminished breath sounds bilaterally with mild end expiratory wheezing Abdominal:     General: There is no distension.     Palpations: Abdomen is soft.     Tenderness: There is no abdominal tenderness.     Comments: High-pitched bowel sounds  Musculoskeletal:     Cervical back: Normal range of motion and neck supple.     Comments: Mild pedal edema bilaterally  Skin:    General: Skin is warm and dry.  Neurological:     General: No focal deficit present.     Mental Status: He is alert and oriented to person, place, and time.     Labs on  Admission: I have personally reviewed following labs and imaging studies  CBC: Recent Labs  Lab 08/05/20 1752  WBC 11.0*  HGB 10.4*  HCT 33.2*  MCV 102.8*  PLT 825   Basic Metabolic Panel: Recent Labs  Lab 08/05/20 1752  NA 133*  K 2.9*  CL 97*  CO2 27  GLUCOSE 129*  BUN 7*  CREATININE 1.08  CALCIUM 8.8*   GFR: Estimated Creatinine Clearance: 65.8 mL/min (by C-G formula based on SCr of 1.08 mg/dL). Liver Function Tests: Recent Labs  Lab 08/05/20 1809  AST 14*  ALT 6  ALKPHOS 91  BILITOT 0.6  PROT 6.5  ALBUMIN 2.8*   No results for input(s): LIPASE, AMYLASE in the last 168 hours. No results for input(s): AMMONIA in the last 168 hours. Coagulation Profile: No results for input(s): INR, PROTIME in the last 168 hours. Cardiac Enzymes: No results for input(s): CKTOTAL, CKMB, CKMBINDEX, TROPONINI in the last 168 hours. BNP (last 3 results) No results for input(s): PROBNP in the last 8760 hours. HbA1C: No results for input(s): HGBA1C in the last 72 hours. CBG: No results for input(s): GLUCAP in the last 168 hours. Lipid Profile: No results for input(s): CHOL, HDL, LDLCALC, TRIG, CHOLHDL, LDLDIRECT in the last 72 hours. Thyroid Function  Tests: No results for input(s): TSH, T4TOTAL, FREET4, T3FREE, THYROIDAB in the last 72 hours. Anemia Panel: No results for input(s): VITAMINB12, FOLATE, FERRITIN, TIBC, IRON, RETICCTPCT in the last 72 hours. Urine analysis:    Component Value Date/Time   COLORURINE AMBER (A) 06/18/2020 1722   APPEARANCEUR HAZY (A) 06/18/2020 1722   LABSPEC 1.035 (H) 06/18/2020 1722   PHURINE 5.0 06/18/2020 1722   GLUCOSEU NEGATIVE 06/18/2020 1722   HGBUR MODERATE (A) 06/18/2020 1722   BILIRUBINUR SMALL (A) 06/18/2020 1722   KETONESUR NEGATIVE 06/18/2020 1722   PROTEINUR 30 (A) 06/18/2020 1722   UROBILINOGEN 2.0 (H) 03/05/2007 0620   NITRITE NEGATIVE 06/18/2020 1722   LEUKOCYTESUR NEGATIVE 06/18/2020 1722    Radiological Exams on Admission: DG Chest 2 View  Result Date: 08/05/2020 CLINICAL DATA:  Chest pain, intermittent sharp chest pain for 2-3 days with increased shortness of breath. EXAM: CHEST - 2 VIEW COMPARISON:  June 23, 2020. FINDINGS: Electronic device projects over LEFT chest. EKG leads over the chest. Signs of increased interstitial markings and pulmonary emphysema more pronounced interstitial changes and bronchial wall thickening than on previous imaging. Trachea midline. Cardiomediastinal contours and hilar structures are stable. No sign of effusion. Slight increased density in the LEFT retrocardiac region waxing and waning compared to previous studies. On limited assessment no acute skeletal process. IMPRESSION: Findings of suspected increased bronchial wall thickening may represent bronchopneumonia/bronchitis. Improved aeration still with persistent opacity at the LEFT lung base. Correlate with any risk for aspiration and consider follow-up PA and lateral chest radiograph to ensure resolution Electronically Signed   By: Zetta Bills M.D.   On: 08/05/2020 18:37   CT Head Wo Contrast  Result Date: 08/05/2020 CLINICAL DATA:  Loss of consciousness. EXAM: CT HEAD WITHOUT CONTRAST TECHNIQUE:  Contiguous axial images were obtained from the base of the skull through the vertex without intravenous contrast. COMPARISON:  None. FINDINGS: Brain: No evidence of acute infarction, hemorrhage, hydrocephalus, extra-axial collection or mass lesion/mass effect. Vascular: No hyperdense vessel or unexpected calcification. Skull: Normal. Negative for fracture or focal lesion. Sinuses/Orbits: No acute finding. Other: None. IMPRESSION: No acute intracranial abnormality seen. Electronically Signed   By: Marijo Conception M.D.   On: 08/05/2020 19:17  EKG: Independently reviewed.  Sinus rhythm, baseline wander.  No significant change since prior tracing.  Assessment/Plan Principal Problem:   Syncope Active Problems:   History of pulmonary embolism   COPD (chronic obstructive pulmonary disease) (H[facility])   Bronchopneumonia   Hypokalemia   Syncope Likely orthostatic as patient reports 2-week history of dizziness/lightheadedness in the setting of nausea/poor oral intake.  Syncopal event happened when he was going from sitting to standing position at home. Recent echo done in April 2022 showing mild to moderate aortic stenosis.  He was diagnosed with A. fib during recent hospitalization but currently in sinus rhythm.  ACS less likely given high sensitive troponin negative x2 and EKG without acute ischemic changes.  PE less likely as he is already anticoagulated for A. fib and no change in oxygen requirement from baseline. Head CT negative for acute intracranial abnormality and neuro exam nonfocal. -Cardiac monitoring.  Gentle IV fluid hydration, check orthostatics.  Check D-dimer level.  He currently has a 14-day Zio patch which was placed during recent cardiology visit and patient states he is 2 week period of monitoring ends tomorrow.  Discuss with cardiology in the morning.  Community-acquired pneumonia/bronchopneumonia Labs showing borderline leukocytosis.  No fever or tachycardia. Chest x-ray showing findings  concerning for bronchopneumonia/bronchitis and persistent opacity at the left lung base.  COVID and influenza PCR negative. -Continue Levaquin.  Blood culture x2 ordered.  Check procalcitonin level.  Nausea ?Gatroenteritis. No associated vomiting, abdominal pain, or diarrhea.  COVID and influenza PCR negative. ?Mechanical bowel obstruction given high-pitched bowel sounds but he did have a bowel movement yesterday. -Given ongoing symptoms for 2 weeks and poor oral intake, CT abdomen pelvis without contrast ordered for further evaluation.  Antiemetic as needed.  Chest pain Likely related to pneumonia.  Work-up including high-sensitivity troponin x2 and EKG not suggestive of ACS. -Cardiac monitoring  Hypokalemia No acute EKG changes. -Monitor potassium and magnesium levels, replenish as needed.  COPD Has mild end expiratory wheezing but no respiratory distress or change in oxygen requirement from baseline. -Albuterol nebulizer treatment, continue as needed.  Continue home inhalers.  Chronic hypoxemic respiratory failure No change in oxygen requirement from baseline.  Uses 3 L home oxygen at night. -Continuous pulse ox, continue supplemental oxygen  Hypertension Not on antihypertensives at home. -Check orthostatics  Paroxysmal A. Fib Currently in sinus rhythm.  Not on a rate control agent. -Continue Eliquis  GERD -Continue PPI  BPH -Continue Flomax  Chronic back pain -Continue home oxycodone PRN  DVT prophylaxis: Eliquis Code Status: Patient wishes to be DNR. Family Communication: No family at bedside. Disposition Plan: Status is: Observation  The patient remains OBS appropriate and will d/c before 2 midnights.  Dispo: The patient is from: Home              Anticipated d/c is to: Home              Patient currently is not medically stable to d/c.   Difficult to place patient No  Level of care: Level of care: Telemetry   The medical decision making on this patient was  of high complexity and the patient is at high risk for clinical deterioration, therefore this is a level 3 visit.  Shela Leff MD [facility]  If 7PM-7AM, please contact night-coverage [email or web address]  08/06/2020, 1:49 AM

## 2020-08-06 NOTE — Progress Notes (Signed)
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  Admitted in April 2022 for septic shock secondary to cholangitis/Klebsiella bacteremia/choledocholithiasis.  His hospital course complicated by new onset A. fib and was started on Eliquis.   -Presented to the ED 5/25 with complaints of nausea, dizziness, shortness of breath and a syncopal episode.  Past 2 weeks he has had severe nausea and poor appetite, minimal oral intake, last few days started feeling dizzy and he subsequently passed out yesterday.  Denies any abdominal pain, no vomiting or diarrhea, denies any fevers or chills, reports chronic dyspnea on exertion due to COPD with a recent productive cough -Labs were notable for WBC of 11, hemoglobin of 10.4, stable, high-sensitivity troponin was negative, EKG without acute changes, chest x-ray was concerning for bronchopneumonia/bronchitis.  Assessment & Plan:   Syncope, orthostatic hypotension -Positive orthostatic vitals, 2-week history of nausea, very poor oral intake followed by dizziness and lightheadedness -Recent echo in April noted moderate aortic stenosis, he was diagnosed with A. fib during recent hospitalization and started on Eliquis, currently in sinus rhythm -Compliant with Eliquis, clinically do not suspect PE -Cut down IV fluids today -Monitor on telemetry, ambulate, PT eval  Community-acquired pneumonia/bronchopneumonia -History of productive cough, mild leukocytosis, chest x-ray concerning for bronchitis and bronchopneumonia -Influenza and COVID PCR negative -Stop Levaquin start ceftriaxone/azithromycin  Presumed CAD Mild to moderate aortic stenosis -Seen by cardiology just prior to this  admission, further work-up deferred due to multiple comorbidities and overall frailty, follow-up with [facility]  Nausea -Could have been secondary to recent antibiotics, was discharged on doxycycline following hospitalization in April for choledocholithiasis with bacteremia -CT abdomen pelvis on exam is unremarkable for intra-abdominal pathology -Improving with supportive care, continue to monitor  Hypokalemia -Replete  COPD/chronic hypoxic respiratory failure -On 3 L home O2 at baseline -Continue nebs  Paroxysmal A. Fib Currently in sinus rhythm.  Not on a rate control agent. -Continue Eliquis  GERD -Continue PPI  BPH -Continue Flomax  Chronic back pain -Continue home oxycodone PRN  DVT prophylaxis: Eliquis Code Status:  DO NOT RESUSCITATE  Family Communication: No family at bedside. Disposition Plan:  Status is: Observation  The patient will require care spanning > 2 midnights and should be moved to inpatient because: Inpatient level of care appropriate due to severity of illness  Dispo: The patient is from: Home              Anticipated d/c is to: Home              Patient currently is not medically stable to d/c.   Difficult to place patient No   Consultants:   Procedures:   Antimicrobials:    Subjective: -Feeling better today, nausea is improving, feels like he will be able to eat something finally -Complains of cough, congestion and some dyspnea  Objective: Vitals:   08/06/20 0430 08/06/20 0500 08/06/20 0743 08/06/20 0841  BP: 138/65 (!) 121/54 129/74 (!) 149/85  Pulse: 84 77 75 83  Resp: (!) 25 15 17 18   Temp:   97.9 F (36.6 C) 98.2 F (36.8 C)  TempSrc:   Oral Oral  SpO2: 94% 98% 100% 94%  Weight:  85.6 kg  Height:    5\' 11"  (1.803 m)    Intake/Output Summary (Last 24 hours) at 08/06/2020 1118 Last data filed at 08/06/2020 0744 Gross per 24 hour  Intake --  Output 325 ml  Net -325 ml   Filed Weights   08/05/20 1826  08/06/20 0841  Weight: 86.2 kg 85.6 kg    Examination:  General exam: Chronically ill elderly male, laying in bed, awake alert oriented x2, no distress CVS: S1-S2, regular rate rhythm, faint systolic murmur noted Lungs: Poor air movement bilaterally, decreased breath sounds to bases Abdomen: Soft, nontender, bowel sounds present Extremities: No edema Skin: No rashes on exposed skin  Data Reviewed:   CBC: Recent Labs  Lab 08/05/20 1752 08/06/20 0240  WBC 11.0* 9.8  HGB 10.4* 8.8*  HCT 33.2* 27.9*  MCV 102.8* 101.1*  PLT 347 638   Basic Metabolic Panel: Recent Labs  Lab 08/05/20 1752 08/06/20 0240  NA 133* 135  K 2.9* 3.5  CL 97* 102  CO2 27 26  GLUCOSE 129* 92  BUN 7* 6*  CREATININE 1.08 0.88  CALCIUM 8.8* 8.2*  MG  --  1.8   GFR: Estimated Creatinine Clearance: 80.8 mL/min (by C-G formula based on SCr of 0.88 mg/dL). Liver Function Tests: Recent Labs  Lab 08/05/20 1809  AST 14*  ALT 6  ALKPHOS 91  BILITOT 0.6  PROT 6.5  ALBUMIN 2.8*   No results for input(s): LIPASE, AMYLASE in the last 168 hours. No results for input(s): AMMONIA in the last 168 hours. Coagulation Profile: No results for input(s): INR, PROTIME in the last 168 hours. Cardiac Enzymes: No results for input(s): CKTOTAL, CKMB, CKMBINDEX, TROPONINI in the last 168 hours. BNP (last 3 results) No results for input(s): PROBNP in the last 8760 hours. HbA1C: No results for input(s): HGBA1C in the last 72 hours. CBG: No results for input(s): GLUCAP in the last 168 hours. Lipid Profile: No results for input(s): CHOL, HDL, LDLCALC, TRIG, CHOLHDL, LDLDIRECT in the last 72 hours. Thyroid Function Tests: No results for input(s): TSH, T4TOTAL, FREET4, T3FREE, THYROIDAB in the last 72 hours. Anemia Panel: No results for input(s): VITAMINB12, FOLATE, FERRITIN, TIBC, IRON, RETICCTPCT in the last 72 hours. Urine analysis:    Component Value Date/Time   COLORURINE AMBER (A) 06/18/2020 1722    APPEARANCEUR HAZY (A) 06/18/2020 1722   LABSPEC 1.035 (H) 06/18/2020 1722   PHURINE 5.0 06/18/2020 1722   GLUCOSEU NEGATIVE 06/18/2020 1722   HGBUR MODERATE (A) 06/18/2020 1722   BILIRUBINUR SMALL (A) 06/18/2020 1722   KETONESUR NEGATIVE 06/18/2020 1722   PROTEINUR 30 (A) 06/18/2020 1722   UROBILINOGEN 2.0 (H) 03/05/2007 0620   NITRITE NEGATIVE 06/18/2020 1722   LEUKOCYTESUR NEGATIVE 06/18/2020 1722   Sepsis Labs: @LABRCNTIP (procalcitonin:4,lacticidven:4)  ) Recent Results (from the past 240 hour(s))  Resp Panel by RT-PCR (Flu A&B, Covid) Nasopharyngeal Swab     Status: None   Collection Time: 08/05/20 10:48 PM   Specimen: Nasopharyngeal Swab; Nasopharyngeal(NP) swabs in vial transport medium  Result Value Ref Range Status   SARS Coronavirus 2 by RT PCR NEGATIVE NEGATIVE Final    Comment: (NOTE) SARS-CoV-2 target nucleic acids are NOT DETECTED.  The SARS-CoV-2 RNA is generally detectable in upper respiratory specimens during the acute phase of infection. The lowest concentration of SARS-CoV-2 viral copies this assay can detect is 138 copies/mL. A negative result does not preclude SARS-Cov-2 infection and should not be used as the sole basis for treatment or other patient management  decisions. A negative result may occur with  improper specimen collection/handling, submission of specimen other than nasopharyngeal swab, presence of viral mutation(s) within the areas targeted by this assay, and inadequate number of viral copies(<138 copies/mL). A negative result must be combined with clinical observations, patient history, and epidemiological information. The expected result is Negative.  Fact Sheet for Patients:  EntrepreneurPulse.com.au  Fact Sheet for Healthcare Providers:  IncredibleEmployment.be  This test is no t yet approved or cleared by the [place] FDA and  has been authorized for detection and/or diagnosis of SARS-CoV-2  by FDA under an Emergency Use Authorization (EUA). This EUA will remain  in effect (meaning this test can be used) for the duration of the COVID-19 declaration under Section 564(b)(1) of the Act, 21 U.S.C.section 360bbb-3(b)(1), unless the authorization is terminated  or revoked sooner.       Influenza A by PCR NEGATIVE NEGATIVE Final   Influenza B by PCR NEGATIVE NEGATIVE Final    Comment: (NOTE) The Xpert Xpress SARS-CoV-2/FLU/RSV plus assay is intended as an aid in the diagnosis of influenza from Nasopharyngeal swab specimens and should not be used as a sole basis for treatment. Nasal washings and aspirates are unacceptable for Xpert Xpress SARS-CoV-2/FLU/RSV testing.  Fact Sheet for Patients: EntrepreneurPulse.com.au  Fact Sheet for Healthcare Providers: IncredibleEmployment.be  This test is not yet approved or cleared by the [place] FDA and has been authorized for detection and/or diagnosis of SARS-CoV-2 by FDA under an Emergency Use Authorization (EUA). This EUA will remain in effect (meaning this test can be used) for the duration of the COVID-19 declaration under Section 564(b)(1) of the Act, 21 U.S.C. section 360bbb-3(b)(1), unless the authorization is terminated or revoked.  Performed at [facility], [place] [address] [place], [place] [address]   Culture, blood (routine x 2)     Status: None (Preliminary result)   Collection Time: 08/06/20  2:40 AM   Specimen: BLOOD RIGHT ARM  Result Value Ref Range Status   Specimen Description   Final    BLOOD RIGHT ARM Performed at [facility], [address], [employer], [place] [address]    Special Requests   Final    BOTTLES DRAWN AEROBIC AND ANAEROBIC Blood Culture adequate volume Performed at [place] [address], [place], [place] [address]    Culture PENDING  Incomplete   Report Status PENDING  Incomplete          Radiology Studies: CT ABDOMEN PELVIS WO CONTRAST  Result Date: 08/06/2020 CLINICAL DATA:  Nausea, vomiting EXAM: CT ABDOMEN AND PELVIS WITHOUT CONTRAST TECHNIQUE: Multidetector CT imaging of the abdomen and pelvis was performed following the standard protocol without IV contrast. COMPARISON:  06/19/2020, CT chest 05/27/2016 FINDINGS: Lower chest: There is extensive bronchial wall thickening again noted within the visualized lung bases bilaterally with peribronchial and centrilobular nodularity noted within the a peripheral right middle lobe in keeping with bronchiolitis. Nodularity within the visualized left lung base appears progressive. These findings can be seen in the setting of atypical infection or aspiration. These are new when compared to remote prior examination of 05/27/2016. Superimposed advanced emphysema is again noted. Extensive right coronary artery calcification. Global cardiac size within normal limits. Trace pericardial effusion. Hepatobiliary: No focal liver abnormality is seen. Status post cholecystectomy. No biliary dilatation. Pancreas: Unremarkable Spleen: Unremarkable Adrenals/Urinary Tract: Adrenal glands are unremarkable. Kidneys are normal, without renal calculi, focal lesion, or hydronephrosis. Bladder is unremarkable. Stomach/Bowel: Severe sigmoid diverticulosis. The stomach,  small bowel, and large bowel are otherwise unremarkable. No evidence of obstruction or focal inflammation. Appendix normal. No free intraperitoneal gas. Trace free fluid noted within the pelvis. Vascular/Lymphatic: Extensive aortoiliac atherosclerotic calcification. Particularly prominent atherosclerotic calcification is seen at the origin of the superior mesenteric artery and renal arteries bilaterally. While this almost certainly results in a hemodynamically significant stenosis of the celiac axis and right renal artery, the degree of stenosis is not well assessed on this non arteriographic study.  No pathologic adenopathy within the abdomen and pelvis. Reproductive: Prostate is unremarkable. Other: No abdominal wall hernia. Left inguinal cord lipoma noted. Rectum unremarkable. Musculoskeletal: No acute bone abnormality. No lytic or blastic bone lesions. Osseous structures are age-appropriate. IMPRESSION: Extensive bronchial wall thickening and progressive peribronchial nodularity noted at the lung bases bilaterally in keeping with atypical infection or aspiration. Superimposed severe centrilobular emphysema. No acute intra-abdominal pathology identified. Severe sigmoid diverticulosis. Peripheral vascular disease. Aortic Atherosclerosis (ICD10-I70.0) and Emphysema (ICD10-J43.9). Electronically Signed   By: Fidela Salisbury MD   On: 08/06/2020 03:51   DG Chest 2 View  Result Date: 08/05/2020 CLINICAL DATA:  Chest pain, intermittent sharp chest pain for 2-3 days with increased shortness of breath. EXAM: CHEST - 2 VIEW COMPARISON:  June 23, 2020. FINDINGS: Electronic device projects over LEFT chest. EKG leads over the chest. Signs of increased interstitial markings and pulmonary emphysema more pronounced interstitial changes and bronchial wall thickening than on previous imaging. Trachea midline. Cardiomediastinal contours and hilar structures are stable. No sign of effusion. Slight increased density in the LEFT retrocardiac region waxing and waning compared to previous studies. On limited assessment no acute skeletal process. IMPRESSION: Findings of suspected increased bronchial wall thickening may represent bronchopneumonia/bronchitis. Improved aeration still with persistent opacity at the LEFT lung base. Correlate with any risk for aspiration and consider follow-up PA and lateral chest radiograph to ensure resolution Electronically Signed   By: Zetta Bills M.D.   On: 08/05/2020 18:37   CT Head Wo Contrast  Result Date: 08/05/2020 CLINICAL DATA:  Loss of consciousness. EXAM: CT HEAD WITHOUT CONTRAST  TECHNIQUE: Contiguous axial images were obtained from the base of the skull through the vertex without intravenous contrast. COMPARISON:  None. FINDINGS: Brain: No evidence of acute infarction, hemorrhage, hydrocephalus, extra-axial collection or mass lesion/mass effect. Vascular: No hyperdense vessel or unexpected calcification. Skull: Normal. Negative for fracture or focal lesion. Sinuses/Orbits: No acute finding. Other: None. IMPRESSION: No acute intracranial abnormality seen. Electronically Signed   By: Marijo Conception M.D.   On: 08/05/2020 19:17        Scheduled Meds: . apixaban  5 mg Oral BID  . azithromycin  250 mg Oral Daily  . donepezil  10 mg Oral Daily  . fluticasone furoate-vilanterol  1 puff Inhalation Daily   And  . umeclidinium bromide  1 puff Inhalation Daily  . gabapentin  600 mg Oral BID  . pantoprazole  40 mg Oral Daily  . roflumilast  250 mcg Oral Daily  . tamsulosin  0.4 mg Oral Daily   Continuous Infusions: . cefTRIAXone (ROCEPHIN)  IV 1 g (08/06/20 1037)     LOS: 0 days    Time spent: 60min  Domenic Polite, MD [facility]  08/06/2020, 11:18 AM

## 2020-08-07 DIAGNOSIS — J180 Bronchopneumonia, unspecified organism: Secondary | ICD-10-CM | POA: Diagnosis not present

## 2020-08-07 DIAGNOSIS — Z86711 Personal history of pulmonary embolism: Secondary | ICD-10-CM

## 2020-08-07 DIAGNOSIS — J439 Emphysema, unspecified: Secondary | ICD-10-CM | POA: Diagnosis not present

## 2020-08-07 DIAGNOSIS — R55 Syncope and collapse: Secondary | ICD-10-CM | POA: Diagnosis not present

## 2020-08-07 LAB — CBC
HCT: 28.4 % — ABNORMAL LOW (ref 39.0–52.0)
Hemoglobin: 8.9 g/dL — ABNORMAL LOW (ref 13.0–17.0)
MCH: 32.1 pg (ref 26.0–34.0)
MCHC: 31.3 g/dL (ref 30.0–36.0)
MCV: 102.5 fL — ABNORMAL HIGH (ref 80.0–100.0)
Platelets: 290 10*3/uL (ref 150–400)
RBC: 2.77 MIL/uL — ABNORMAL LOW (ref 4.22–5.81)
RDW: 14.2 % (ref 11.5–15.5)
WBC: 6.3 10*3/uL (ref 4.0–10.5)
nRBC: 0 % (ref 0.0–0.2)

## 2020-08-07 LAB — COMPREHENSIVE METABOLIC PANEL
ALT: 7 U/L (ref 0–44)
AST: 10 U/L — ABNORMAL LOW (ref 15–41)
Albumin: 2.2 g/dL — ABNORMAL LOW (ref 3.5–5.0)
Alkaline Phosphatase: 70 U/L (ref 38–126)
Anion gap: 4 — ABNORMAL LOW (ref 5–15)
BUN: <5 mg/dL — ABNORMAL LOW (ref 8–23)
CO2: 31 mmol/L (ref 22–32)
Calcium: 8.7 mg/dL — ABNORMAL LOW (ref 8.9–10.3)
Chloride: 102 mmol/L (ref 98–111)
Creatinine, Ser: 0.68 mg/dL (ref 0.61–1.24)
GFR, Estimated: >60 mL/min (ref >60)
Glucose, Bld: 113 mg/dL — ABNORMAL HIGH (ref 70–99)
Potassium: 3.7 mmol/L (ref 3.5–5.1)
Sodium: 137 mmol/L (ref 135–145)
Total Bilirubin: 0.2 mg/dL — ABNORMAL LOW (ref 0.3–1.2)
Total Protein: 5.4 g/dL — ABNORMAL LOW (ref 6.5–8.1)

## 2020-08-07 NOTE — Progress Notes (Signed)
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  Admitted in April 2022 for septic shock secondary to cholangitis/Klebsiella bacteremia/choledocholithiasis.  His hospital course complicated by new onset A. fib and was started on Eliquis.   -Presented to the ED 5/25 with complaints of nausea, dizziness, shortness of breath and a syncopal episode.  Past 2 weeks he has had severe nausea and poor appetite, minimal oral intake, last few days started feeling dizzy and he subsequently passed out yesterday.  Denies any abdominal pain, no vomiting or diarrhea, denies any fevers or chills, reports chronic dyspnea on exertion due to COPD with a recent productive cough -Labs were notable for WBC of 11, hemoglobin of 10.4, stable, high-sensitivity troponin was negative, EKG without acute changes, chest x-ray was concerning for bronchopneumonia/bronchitis.  Assessment & Plan:   Syncope, orthostatic hypotension -Positive orthostatic vitals, 2-week history of nausea, very poor oral intake followed by dizziness and lightheadedness -Recent echo in April noted moderate aortic stenosis, he was diagnosed with A. fib during recent hospitalization and started on Eliquis -Symptoms improving, no events on telemetry -PT eval completed, ambulating well without symptoms now  Community-acquired pneumonia/bronchopneumonia -History of productive cough, mild leukocytosis, chest x-ray concerning for bronchitis and bronchopneumonia -Influenza and COVID PCR negative -Continue IV ceftriaxone and azithromycin day 2, transition to oral antibiotics tomorrow  Presumed CAD Mild to moderate aortic stenosis -Seen by cardiology just prior to this admission,  further work-up deferred due to multiple comorbidities and overall frailty, follow-up with [facility]  Nausea -Could have been secondary to recent antibiotics, was discharged on doxycycline following hospitalization in April for choledocholithiasis with bacteremia -CT abdomen pelvis on exam is unremarkable for intra-abdominal pathology -Improving with supportive care, continue to monitor  Hypokalemia -Repleted  COPD/chronic hypoxic respiratory failure -On 3 L home O2 at baseline -Continue nebs  Paroxysmal A. Fib Currently in sinus rhythm.  Not on a rate control agent. -Continue Eliquis  GERD -Continue PPI  BPH -Continue Flomax  Chronic back pain -Continue home oxycodone PRN  DVT prophylaxis: Eliquis Code Status:  DO NOT RESUSCITATE  Family Communication: No family at bedside. Disposition Plan:  Status is: Inpatient Inpatient level of care appropriate due to severity of illness  Dispo: The patient is from: Home              Anticipated d/c is to: Home              Patient currently is not medically stable to d/c.   Difficult to place patient No   Consultants:   Procedures:   Antimicrobials:    Subjective: -Some cough and congestion, breathing overall improving, denies any dizziness or nausea today  Objective: Vitals:   08/06/20 1644 08/06/20 2040 08/07/20 0523 08/07/20 0753  BP: (!) 142/67 (!) 143/78 139/63   Pulse: 78 86 67   Resp: 16 16 14    Temp: 98.7 F (37.1 C) 97.9 F (36.6 C) 98 F (36.7 C)   TempSrc: Oral Oral Oral   SpO2: 96% 95% 94% 95%  Weight:      Height:        Intake/Output Summary (Last 24 hours) at 08/07/2020 1158 Last data filed at 08/07/2020 0524 Gross per  24 hour  Intake 340 ml  Output 3250 ml  Net -2910 ml   Filed Weights   08/05/20 1826 08/06/20 0841  Weight: 86.2 kg 85.6 kg    Examination:  General exam: Chronically ill elderly male laying in bed, AAO x2, no distress CVS: S1-S2, regular rate rhythm,  systolic murmur noted Lungs: Poor air movement bilaterally, decreased breath sounds to bases Abdomen: Soft, nontender, bowel sounds present Extremities: No edema  Skin: No rashes on exposed skin   Data Reviewed:   CBC: Recent Labs  Lab 08/05/20 1752 08/06/20 0240 08/07/20 0605  WBC 11.0* 9.8 6.3  HGB 10.4* 8.8* 8.9*  HCT 33.2* 27.9* 28.4*  MCV 102.8* 101.1* 102.5*  PLT 347 290 094   Basic Metabolic Panel: Recent Labs  Lab 08/05/20 1752 08/06/20 0240 08/07/20 0605  NA 133* 135 137  K 2.9* 3.5 3.7  CL 97* 102 102  CO2 27 26 31   GLUCOSE 129* 92 113*  BUN 7* 6* <5*  CREATININE 1.08 0.88 0.68  CALCIUM 8.8* 8.2* 8.7*  MG  --  1.8  --    GFR: Estimated Creatinine Clearance: 88.9 mL/min (by C-G formula based on SCr of 0.68 mg/dL). Liver Function Tests: Recent Labs  Lab 08/05/20 1809 08/07/20 0605  AST 14* 10*  ALT 6 7  ALKPHOS 91 70  BILITOT 0.6 0.2*  PROT 6.5 5.4*  ALBUMIN 2.8* 2.2*   No results for input(s): LIPASE, AMYLASE in the last 168 hours. No results for input(s): AMMONIA in the last 168 hours. Coagulation Profile: No results for input(s): INR, PROTIME in the last 168 hours. Cardiac Enzymes: No results for input(s): CKTOTAL, CKMB, CKMBINDEX, TROPONINI in the last 168 hours. BNP (last 3 results) No results for input(s): PROBNP in the last 8760 hours. HbA1C: No results for input(s): HGBA1C in the last 72 hours. CBG: No results for input(s): GLUCAP in the last 168 hours. Lipid Profile: No results for input(s): CHOL, HDL, LDLCALC, TRIG, CHOLHDL, LDLDIRECT in the last 72 hours. Thyroid Function Tests: No results for input(s): TSH, T4TOTAL, FREET4, T3FREE, THYROIDAB in the last 72 hours. Anemia Panel: No results for input(s): VITAMINB12, FOLATE, FERRITIN, TIBC, IRON, RETICCTPCT in the last 72 hours. Urine analysis:    Component Value Date/Time   COLORURINE AMBER (A) 06/18/2020 1722   APPEARANCEUR HAZY (A) 06/18/2020 1722   LABSPEC 1.035 (H)  06/18/2020 1722   PHURINE 5.0 06/18/2020 1722   GLUCOSEU NEGATIVE 06/18/2020 1722   HGBUR MODERATE (A) 06/18/2020 1722   BILIRUBINUR SMALL (A) 06/18/2020 1722   KETONESUR NEGATIVE 06/18/2020 1722   PROTEINUR 30 (A) 06/18/2020 1722   UROBILINOGEN 2.0 (H) 03/05/2007 0620   NITRITE NEGATIVE 06/18/2020 1722   LEUKOCYTESUR NEGATIVE 06/18/2020 1722   Sepsis Labs: @LABRCNTIP (procalcitonin:4,lacticidven:4)  ) Recent Results (from the past 240 hour(s))  Resp Panel by RT-PCR (Flu A&B, Covid) Nasopharyngeal Swab     Status: None   Collection Time: 08/05/20 10:48 PM   Specimen: Nasopharyngeal Swab; Nasopharyngeal(NP) swabs in vial transport medium  Result Value Ref Range Status   SARS Coronavirus 2 by RT PCR NEGATIVE NEGATIVE Final    Comment: (NOTE) SARS-CoV-2 target nucleic acids are NOT DETECTED.  The SARS-CoV-2 RNA is generally detectable in upper respiratory specimens during the acute phase of infection. The lowest concentration of SARS-CoV-2 viral copies this assay can detect is 138 copies/mL. A negative result does not preclude SARS-Cov-2 infection and should not be used as the sole basis for treatment or other patient management decisions. A  negative result may occur with  improper specimen collection/handling, submission of specimen other than nasopharyngeal swab, presence of viral mutation(s) within the areas targeted by this assay, and inadequate number of viral copies(<138 copies/mL). A negative result must be combined with clinical observations, patient history, and epidemiological information. The expected result is Negative.  Fact Sheet for Patients:  EntrepreneurPulse.com.au  Fact Sheet for Healthcare Providers:  IncredibleEmployment.be  This test is no t yet approved or cleared by the [place] FDA and  has been authorized for detection and/or diagnosis of SARS-CoV-2 by FDA under an Emergency Use Authorization (EUA). This EUA  will remain  in effect (meaning this test can be used) for the duration of the COVID-19 declaration under Section 564(b)(1) of the Act, 21 U.S.C.section 360bbb-3(b)(1), unless the authorization is terminated  or revoked sooner.       Influenza A by PCR NEGATIVE NEGATIVE Final   Influenza B by PCR NEGATIVE NEGATIVE Final    Comment: (NOTE) The Xpert Xpress SARS-CoV-2/FLU/RSV plus assay is intended as an aid in the diagnosis of influenza from Nasopharyngeal swab specimens and should not be used as a sole basis for treatment. Nasal washings and aspirates are unacceptable for Xpert Xpress SARS-CoV-2/FLU/RSV testing.  Fact Sheet for Patients: EntrepreneurPulse.com.au  Fact Sheet for Healthcare Providers: IncredibleEmployment.be  This test is not yet approved or cleared by the [place] FDA and has been authorized for detection and/or diagnosis of SARS-CoV-2 by FDA under an Emergency Use Authorization (EUA). This EUA will remain in effect (meaning this test can be used) for the duration of the COVID-19 declaration under Section 564(b)(1) of the Act, 21 U.S.C. section 360bbb-3(b)(1), unless the authorization is terminated or revoked.  Performed at [facility], [place] [address], [place], [place] [address]   Culture, blood (routine x 2)     Status: None (Preliminary result)   Collection Time: 08/06/20  2:20 AM   Specimen: BLOOD  Result Value Ref Range Status   Specimen Description   Final    BLOOD RIGHT ANTECUBITAL Performed at [place] [address], [place], [place] [address]    Special Requests   Final    BOTTLES DRAWN AEROBIC AND ANAEROBIC Blood Culture adequate volume Performed at [place] [address], [place], [place] [address]    Culture   Final    NO GROWTH 1 DAY Performed at [facility], [place] [address] [place], [place] [address]    Report Status  PENDING  Incomplete  Culture, blood (routine x 2)     Status: None (Preliminary result)   Collection Time: 08/06/20  2:40 AM   Specimen: BLOOD RIGHT ARM  Result Value Ref Range Status   Specimen Description   Final    BLOOD RIGHT ARM Performed at [facility], [place] [address] [place], [place] [address]    Special Requests   Final    BOTTLES DRAWN AEROBIC AND ANAEROBIC Blood Culture adequate volume Performed at [place] [address] [place], [place] [address]    Culture   Final    NO GROWTH 1 DAY Performed at [facility], [place] [address], [place],  [address]    Report Status PENDING  Incomplete         Radiology Studies: CT ABDOMEN PELVIS WO CONTRAST  Result Date: 08/06/2020 CLINICAL DATA:  Nausea, vomiting EXAM: CT ABDOMEN AND PELVIS WITHOUT CONTRAST TECHNIQUE: Multidetector CT imaging of the abdomen and  pelvis was performed following the standard protocol without IV contrast. COMPARISON:  06/19/2020, CT chest 05/27/2016 FINDINGS: Lower chest: There is extensive bronchial wall thickening again noted within the visualized lung bases bilaterally with peribronchial and centrilobular nodularity noted within the a peripheral right middle lobe in keeping with bronchiolitis. Nodularity within the visualized left lung base appears progressive. These findings can be seen in the setting of atypical infection or aspiration. These are new when compared to remote prior examination of 05/27/2016. Superimposed advanced emphysema is again noted. Extensive right coronary artery calcification. Global cardiac size within normal limits. Trace pericardial effusion. Hepatobiliary: No focal liver abnormality is seen. Status post cholecystectomy. No biliary dilatation. Pancreas: Unremarkable Spleen: Unremarkable Adrenals/Urinary Tract: Adrenal glands are unremarkable. Kidneys are normal, without renal calculi, focal lesion, or hydronephrosis. Bladder is unremarkable.  Stomach/Bowel: Severe sigmoid diverticulosis. The stomach, small bowel, and large bowel are otherwise unremarkable. No evidence of obstruction or focal inflammation. Appendix normal. No free intraperitoneal gas. Trace free fluid noted within the pelvis. Vascular/Lymphatic: Extensive aortoiliac atherosclerotic calcification. Particularly prominent atherosclerotic calcification is seen at the origin of the superior mesenteric artery and renal arteries bilaterally. While this almost certainly results in a hemodynamically significant stenosis of the celiac axis and right renal artery, the degree of stenosis is not well assessed on this non arteriographic study. No pathologic adenopathy within the abdomen and pelvis. Reproductive: Prostate is unremarkable. Other: No abdominal wall hernia. Left inguinal cord lipoma noted. Rectum unremarkable. Musculoskeletal: No acute bone abnormality. No lytic or blastic bone lesions. Osseous structures are age-appropriate. IMPRESSION: Extensive bronchial wall thickening and progressive peribronchial nodularity noted at the lung bases bilaterally in keeping with atypical infection or aspiration. Superimposed severe centrilobular emphysema. No acute intra-abdominal pathology identified. Severe sigmoid diverticulosis. Peripheral vascular disease. Aortic Atherosclerosis (ICD10-I70.0) and Emphysema (ICD10-J43.9). Electronically Signed   By: Fidela Salisbury MD   On: 08/06/2020 03:51   DG Chest 2 View  Result Date: 08/05/2020 CLINICAL DATA:  Chest pain, intermittent sharp chest pain for 2-3 days with increased shortness of breath. EXAM: CHEST - 2 VIEW COMPARISON:  June 23, 2020. FINDINGS: Electronic device projects over LEFT chest. EKG leads over the chest. Signs of increased interstitial markings and pulmonary emphysema more pronounced interstitial changes and bronchial wall thickening than on previous imaging. Trachea midline. Cardiomediastinal contours and hilar structures are stable.  No sign of effusion. Slight increased density in the LEFT retrocardiac region waxing and waning compared to previous studies. On limited assessment no acute skeletal process. IMPRESSION: Findings of suspected increased bronchial wall thickening may represent bronchopneumonia/bronchitis. Improved aeration still with persistent opacity at the LEFT lung base. Correlate with any risk for aspiration and consider follow-up PA and lateral chest radiograph to ensure resolution Electronically Signed   By: Zetta Bills M.D.   On: 08/05/2020 18:37   CT Head Wo Contrast  Result Date: 08/05/2020 CLINICAL DATA:  Loss of consciousness. EXAM: CT HEAD WITHOUT CONTRAST TECHNIQUE: Contiguous axial images were obtained from the base of the skull through the vertex without intravenous contrast. COMPARISON:  None. FINDINGS: Brain: No evidence of acute infarction, hemorrhage, hydrocephalus, extra-axial collection or mass lesion/mass effect. Vascular: No hyperdense vessel or unexpected calcification. Skull: Normal. Negative for fracture or focal lesion. Sinuses/Orbits: No acute finding. Other: None. IMPRESSION: No acute intracranial abnormality seen. Electronically Signed   By: Marijo Conception M.D.   On: 08/05/2020 19:17    Scheduled Meds: . apixaban  5 mg Oral BID  . azithromycin  250 mg Oral  Daily  . donepezil  10 mg Oral Daily  . fluticasone furoate-vilanterol  1 puff Inhalation Daily   And  . umeclidinium bromide  1 puff Inhalation Daily  . gabapentin  600 mg Oral BID  . pantoprazole  40 mg Oral Daily  . roflumilast  250 mcg Oral Daily  . tamsulosin  0.4 mg Oral Daily   Continuous Infusions: . cefTRIAXone (ROCEPHIN)  IV 1 g (08/07/20 1008)     LOS: 1 day    Time spent: 32min  Domenic Polite, MD [facility]  08/07/2020, 11:58 AM

## 2020-08-07 NOTE — Progress Notes (Signed)
OT Cancellation Note  Patient Details Name: Warren [name] MRN: [id number] DOB: Jul 07, 1950   Cancelled Treatment:    Reason Eval/Treat Not Completed: OT screened, no needs identified, will sign off. Patient sitting up at edge of bed upon arrival, denies any OT needs and has been ambulating to bathroom. Confirmed with patient worked with PT yesterday and is at his baseline, no acute OT concerns at this time.  Delbert Phenix OT OT pager: [place] 08/07/2020, 12:03 PM

## 2020-08-08 DIAGNOSIS — J439 Emphysema, unspecified: Secondary | ICD-10-CM | POA: Diagnosis not present

## 2020-08-08 DIAGNOSIS — Z86711 Personal history of pulmonary embolism: Secondary | ICD-10-CM | POA: Diagnosis not present

## 2020-08-08 DIAGNOSIS — J180 Bronchopneumonia, unspecified organism: Secondary | ICD-10-CM | POA: Diagnosis not present

## 2020-08-08 DIAGNOSIS — R55 Syncope and collapse: Secondary | ICD-10-CM | POA: Diagnosis not present

## 2020-08-08 LAB — CBC
HCT: 27.3 % — ABNORMAL LOW (ref 39.0–52.0)
Hemoglobin: 8.6 g/dL — ABNORMAL LOW (ref 13.0–17.0)
MCH: 32.2 pg (ref 26.0–34.0)
MCHC: 31.5 g/dL (ref 30.0–36.0)
MCV: 102.2 fL — ABNORMAL HIGH (ref 80.0–100.0)
Platelets: 330 10*3/uL (ref 150–400)
RBC: 2.67 MIL/uL — ABNORMAL LOW (ref 4.22–5.81)
RDW: 14.1 % (ref 11.5–15.5)
WBC: 6.5 10*3/uL (ref 4.0–10.5)
nRBC: 0 % (ref 0.0–0.2)

## 2020-08-08 LAB — BASIC METABOLIC PANEL
Anion gap: 6 (ref 5–15)
BUN: <5 mg/dL — ABNORMAL LOW (ref 8–23)
CO2: 30 mmol/L (ref 22–32)
Calcium: 8.8 mg/dL — ABNORMAL LOW (ref 8.9–10.3)
Chloride: 100 mmol/L (ref 98–111)
Creatinine, Ser: 0.9 mg/dL (ref 0.61–1.24)
GFR, Estimated: >60 mL/min (ref >60)
Glucose, Bld: 106 mg/dL — ABNORMAL HIGH (ref 70–99)
Potassium: 3.7 mmol/L (ref 3.5–5.1)
Sodium: 136 mmol/L (ref 135–145)

## 2020-08-08 MED ORDER — POTASSIUM CHLORIDE CRYS ER 20 MEQ PO TBCR
40.0000 meq | EXTENDED_RELEASE_TABLET | Freq: Once | ORAL | Status: AC
  Administered 2020-08-08: 40 meq via ORAL
  Filled 2020-08-08: qty 2

## 2020-08-08 MED ORDER — FUROSEMIDE 20 MG PO TABS
20.0000 mg | ORAL_TABLET | Freq: Once | ORAL | Status: AC
  Administered 2020-08-08: 20 mg via ORAL
  Filled 2020-08-08: qty 1

## 2020-08-08 MED ORDER — METHYLPREDNISOLONE SODIUM SUCC 125 MG IJ SOLR
60.0000 mg | Freq: Two times a day (BID) | INTRAMUSCULAR | Status: DC
  Administered 2020-08-08 (×2): 60 mg via INTRAVENOUS
  Filled 2020-08-08 (×2): qty 2

## 2020-08-08 MED ORDER — DONEPEZIL HCL 10 MG PO TABS
10.0000 mg | ORAL_TABLET | Freq: Every day | ORAL | Status: DC
  Administered 2020-08-08: 10 mg via ORAL
  Filled 2020-08-08: qty 1

## 2020-08-08 NOTE — Progress Notes (Signed)
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  Admitted in April 2022 for septic shock secondary to cholangitis/Klebsiella bacteremia/choledocholithiasis.  His hospital course complicated by new onset A. fib and was started on Eliquis.   -Presented to the ED 5/25 with complaints of nausea, dizziness, shortness of breath and a syncopal episode.  Past 2 weeks he has had severe nausea and poor appetite, minimal oral intake, last few days started feeling dizzy and he subsequently passed out yesterday.  Denies any abdominal pain, no vomiting or diarrhea, denies any fevers or chills, reports chronic dyspnea on exertion due to COPD with a recent productive cough -Labs were notable for WBC of 11, hemoglobin of 10.4, stable, high-sensitivity troponin was negative, EKG without acute changes, chest x-ray was concerning for bronchopneumonia/bronchitis.  Assessment & Plan:   Syncope, orthostatic hypotension -Positive orthostatic vitals, 2-week history of nausea, very poor oral intake followed by dizziness and lightheadedness -Recent echo in April noted moderate aortic stenosis, he was diagnosed with A. fib during recent hospitalization and started on Eliquis -Symptoms improving, no events on telemetry -PT eval completed, ambulating well -Discharge planning  Community-acquired pneumonia/bronchopneumonia COPD exacerbation -History of productive cough, mild leukocytosis, chest x-ray concerning for bronchitis and bronchopneumonia -Influenza and COVID PCR negative -Clinically improving however with increased wheezing today -Add IV Solu-Medrol, continue ceftriaxone/azithromycin, continue duo nebs -Appears slightly volume overloaded as  well with trace edema, likely iatrogenic, low-dose Lasix p.o. x1  Presumed CAD Mild to moderate aortic stenosis -Seen by cardiology just prior to this admission, further work-up deferred due to multiple comorbidities and overall frailty, follow-up with [facility]  Nausea -Could have been secondary to recent antibiotics, was discharged on doxycycline following hospitalization in April for choledocholithiasis with bacteremia -CT abdomen pelvis on exam is unremarkable for intra-abdominal pathology -Improving with supportive care, continue to monitor  Hypokalemia -Repleted  COPD/chronic hypoxic respiratory failure -On 3 L home O2 at baseline -Continue nebs  Paroxysmal A. Fib Currently in sinus rhythm.  Not on a rate control agent. -Continue Eliquis  GERD -Continue PPI  BPH -Continue Flomax  Chronic back pain -Continue home oxycodone PRN  DVT prophylaxis: Eliquis Code Status:  DO NOT RESUSCITATE  Family Communication: No family at bedside. Disposition Plan:  Status is: Inpatient Inpatient level of care appropriate due to severity of illness  Dispo: The patient is from: Home              Anticipated d/c is to: Home              Patient currently is not medically stable to d/c.   Difficult to place patient No   Consultants:   Procedures:   Antimicrobials:    Subjective: -Some cough and congestion, breathing overall improving, denies any dizziness or nausea today  Objective: Vitals:   08/07/20 0753 08/07/20 1436 08/07/20 2134 08/08/20 0545  BP:  128/68 (!) 153/96 124/64  Pulse:  (!) 101 89 78  Resp:  20 16 14   Temp:  (!) 97.4 F (36.3 C) 97.9 F (36.6 C) (!) 97.5 F (36.4 C)  TempSrc:  Oral Oral Oral  SpO2: 95% 93% 97% 91%  Weight:      Height:  Intake/Output Summary (Last 24 hours) at 08/08/2020 1310 Last data filed at 08/08/2020 0958 Gross per 24 hour  Intake 240 ml  Output --  Net 240 ml   Filed Weights   08/05/20 1826  08/06/20 0841  Weight: 86.2 kg 85.6 kg    Examination:  General exam: Chronically ill elderly male, sitting up in bed, AAO x2, no distress CVS: S1-S2, regular rhythm, systolic murmur noted Lungs: Poor air movement bilaterally, rare expiratory wheezes Abdomen: Soft, nontender, bowel sounds present Extremities: Trace edema  Skin: No rashes on exposed skin   Data Reviewed:   CBC: Recent Labs  Lab 08/05/20 1752 08/06/20 0240 08/07/20 0605 08/08/20 0435  WBC 11.0* 9.8 6.3 6.5  HGB 10.4* 8.8* 8.9* 8.6*  HCT 33.2* 27.9* 28.4* 27.3*  MCV 102.8* 101.1* 102.5* 102.2*  PLT 347 290 290 903   Basic Metabolic Panel: Recent Labs  Lab 08/05/20 1752 08/06/20 0240 08/07/20 0605 08/08/20 0435  NA 133* 135 137 136  K 2.9* 3.5 3.7 3.7  CL 97* 102 102 100  CO2 27 26 31 30   GLUCOSE 129* 92 113* 106*  BUN 7* 6* <5* <5*  CREATININE 1.08 0.88 0.68 0.90  CALCIUM 8.8* 8.2* 8.7* 8.8*  MG  --  1.8  --   --    GFR: Estimated Creatinine Clearance: 79 mL/min (by C-G formula based on SCr of 0.9 mg/dL). Liver Function Tests: Recent Labs  Lab 08/05/20 1809 08/07/20 0605  AST 14* 10*  ALT 6 7  ALKPHOS 91 70  BILITOT 0.6 0.2*  PROT 6.5 5.4*  ALBUMIN 2.8* 2.2*   No results for input(s): LIPASE, AMYLASE in the last 168 hours. No results for input(s): AMMONIA in the last 168 hours. Coagulation Profile: No results for input(s): INR, PROTIME in the last 168 hours. Cardiac Enzymes: No results for input(s): CKTOTAL, CKMB, CKMBINDEX, TROPONINI in the last 168 hours. BNP (last 3 results) No results for input(s): PROBNP in the last 8760 hours. HbA1C: No results for input(s): HGBA1C in the last 72 hours. CBG: No results for input(s): GLUCAP in the last 168 hours. Lipid Profile: No results for input(s): CHOL, HDL, LDLCALC, TRIG, CHOLHDL, LDLDIRECT in the last 72 hours. Thyroid Function Tests: No results for input(s): TSH, T4TOTAL, FREET4, T3FREE, THYROIDAB in the last 72 hours. Anemia  Panel: No results for input(s): VITAMINB12, FOLATE, FERRITIN, TIBC, IRON, RETICCTPCT in the last 72 hours. Urine analysis:    Component Value Date/Time   COLORURINE AMBER (A) 06/18/2020 1722   APPEARANCEUR HAZY (A) 06/18/2020 1722   LABSPEC 1.035 (H) 06/18/2020 1722   PHURINE 5.0 06/18/2020 1722   GLUCOSEU NEGATIVE 06/18/2020 1722   HGBUR MODERATE (A) 06/18/2020 1722   BILIRUBINUR SMALL (A) 06/18/2020 1722   KETONESUR NEGATIVE 06/18/2020 1722   PROTEINUR 30 (A) 06/18/2020 1722   UROBILINOGEN 2.0 (H) 03/05/2007 0620   NITRITE NEGATIVE 06/18/2020 1722   LEUKOCYTESUR NEGATIVE 06/18/2020 1722   Sepsis Labs: @LABRCNTIP (procalcitonin:4,lacticidven:4)  ) Recent Results (from the past 240 hour(s))  Resp Panel by RT-PCR (Flu A&B, Covid) Nasopharyngeal Swab     Status: None   Collection Time: 08/05/20 10:48 PM   Specimen: Nasopharyngeal Swab; Nasopharyngeal(NP) swabs in vial transport medium  Result Value [place] Status   SARS Coronavirus 2 by RT PCR NEGATIVE NEGATIVE Final    Comment: (NOTE) SARS-CoV-2 target nucleic acids are NOT DETECTED.  The SARS-CoV-2 RNA is generally detectable in upper respiratory specimens during the acute phase of infection. The lowest concentration of SARS-CoV-2 viral  copies this assay can detect is 138 copies/mL. A negative result does not preclude SARS-Cov-2 infection and should not be used as the sole basis for treatment or other patient management decisions. A negative result may occur with  improper specimen collection/handling, submission of specimen other than nasopharyngeal swab, presence of viral mutation(s) within the areas targeted by this assay, and inadequate number of viral copies(<138 copies/mL). A negative result must be combined with clinical observations, patient history, and epidemiological information. The expected result is Negative.  Fact Sheet for Patients:  EntrepreneurPulse.com.au  Fact Sheet for Healthcare  Providers:  IncredibleEmployment.be  This test is no t yet approved or cleared by the [place] FDA and  has been authorized for detection and/or diagnosis of SARS-CoV-2 by FDA under an Emergency Use Authorization (EUA). This EUA will remain  in effect (meaning this test can be used) for the duration of the COVID-19 declaration under Section 564(b)(1) of the Act, 21 U.S.C.section 360bbb-3(b)(1), unless the authorization is terminated  or revoked sooner.       Influenza A by PCR NEGATIVE NEGATIVE Final   Influenza B by PCR NEGATIVE NEGATIVE Final    Comment: (NOTE) The Xpert Xpress SARS-CoV-2/FLU/RSV plus assay is intended as an aid in the diagnosis of influenza from Nasopharyngeal swab specimens and should not be used as a sole basis for treatment. Nasal washings and aspirates are unacceptable for Xpert Xpress SARS-CoV-2/FLU/RSV testing.  Fact Sheet for Patients: EntrepreneurPulse.com.au  Fact Sheet for Healthcare Providers: IncredibleEmployment.be  This test is not yet approved or cleared by the [place] FDA and has been authorized for detection and/or diagnosis of SARS-CoV-2 by FDA under an Emergency Use Authorization (EUA). This EUA will remain in effect (meaning this test can be used) for the duration of the COVID-19 declaration under Section 564(b)(1) of the Act, 21 U.S.C. section 360bbb-3(b)(1), unless the authorization is terminated or revoked.  Performed at [facility], [place] [address], [place], [place] [address]   Culture, blood (routine x 2)     Status: None (Preliminary result)   Collection Time: 08/06/20  2:20 AM   Specimen: BLOOD  Result Value [place] Status   Specimen Description   Final    BLOOD RIGHT ANTECUBITAL Performed at [facility] [address], [place], [place] [address]    Special Requests   Final    BOTTLES DRAWN AEROBIC AND ANAEROBIC Blood  Culture adequate volume Performed at [facility] [address], [place], [place] [address]    Culture   Final    NO GROWTH 2 DAYS Performed at [facility] [address] [place] [address]    Report Status PENDING  Incomplete  Culture, blood (routine x 2)     Status: None (Preliminary result)   Collection Time: 08/06/20  2:40 AM   Specimen: BLOOD RIGHT ARM  Result Value [place] Status   Specimen Description   Final    BLOOD RIGHT ARM Performed at [facility], [place] [address], [place], [place] [address]    Special Requests   Final    BOTTLES DRAWN AEROBIC AND ANAEROBIC Blood Culture adequate volume Performed at [facility] [address], [place], [place] [address]    Culture   Final    NO GROWTH 2 DAYS Performed at [facility] [address] [place], [place] [address]    Report Status PENDING  Incomplete    Radiology Studies: No  results found.  Scheduled Meds: . apixaban  5 mg Oral BID  . azithromycin  250 mg Oral Daily  . donepezil  10 mg Oral QHS  . fluticasone furoate-vilanterol  1 puff Inhalation Daily   And  . umeclidinium bromide  1 puff Inhalation Daily  . gabapentin  600 mg Oral BID  . methylPREDNISolone (SOLU-MEDROL) injection  60 mg Intravenous Q12H  . pantoprazole  40 mg Oral Daily  . roflumilast  250 mcg Oral Daily  . tamsulosin  0.4 mg Oral Daily   Continuous Infusions: . cefTRIAXone (ROCEPHIN)  IV 1 g (08/08/20 1047)     LOS: 2 days   Time spent: 38min  Domenic Polite, MD [facility]  08/08/2020, 1:10 PM

## 2020-08-09 DIAGNOSIS — J439 Emphysema, unspecified: Secondary | ICD-10-CM | POA: Diagnosis not present

## 2020-08-09 DIAGNOSIS — J180 Bronchopneumonia, unspecified organism: Secondary | ICD-10-CM | POA: Diagnosis not present

## 2020-08-09 DIAGNOSIS — R55 Syncope and collapse: Secondary | ICD-10-CM | POA: Diagnosis not present

## 2020-08-09 LAB — CBC
HCT: 27.2 % — ABNORMAL LOW (ref 39.0–52.0)
Hemoglobin: 8.7 g/dL — ABNORMAL LOW (ref 13.0–17.0)
MCH: 32 pg (ref 26.0–34.0)
MCHC: 32 g/dL (ref 30.0–36.0)
MCV: 100 fL (ref 80.0–100.0)
Platelets: 339 10*3/uL (ref 150–400)
RBC: 2.72 MIL/uL — ABNORMAL LOW (ref 4.22–5.81)
RDW: 13.6 % (ref 11.5–15.5)
WBC: 4.8 10*3/uL (ref 4.0–10.5)
nRBC: 0 % (ref 0.0–0.2)

## 2020-08-09 LAB — BASIC METABOLIC PANEL
Anion gap: 4 — ABNORMAL LOW (ref 5–15)
BUN: <5 mg/dL — ABNORMAL LOW (ref 8–23)
CO2: 32 mmol/L (ref 22–32)
Calcium: 9 mg/dL (ref 8.9–10.3)
Chloride: 95 mmol/L — ABNORMAL LOW (ref 98–111)
Creatinine, Ser: 0.84 mg/dL (ref 0.61–1.24)
GFR, Estimated: >60 mL/min (ref >60)
Glucose, Bld: 159 mg/dL — ABNORMAL HIGH (ref 70–99)
Potassium: 4.6 mmol/L (ref 3.5–5.1)
Sodium: 131 mmol/L — ABNORMAL LOW (ref 135–145)

## 2020-08-09 MED ORDER — PREDNISONE 20 MG PO TABS
40.0000 mg | ORAL_TABLET | Freq: Every day | ORAL | Status: DC
  Administered 2020-08-09: 40 mg via ORAL
  Filled 2020-08-09: qty 2

## 2020-08-09 MED ORDER — PREDNISONE 20 MG PO TABS: 20.0000 mg | ORAL_TABLET | Freq: Every day | ORAL | 0 refills | Status: DC

## 2020-08-09 MED ORDER — CEFDINIR 300 MG PO CAPS: 300.0000 mg | ORAL_CAPSULE | Freq: Two times a day (BID) | ORAL | 0 refills | Status: AC

## 2020-08-09 MED ORDER — ONDANSETRON 4 MG PO TBDP: 4.0000 mg | ORAL_TABLET | Freq: Three times a day (TID) | ORAL | 0 refills | Status: DC | PRN

## 2020-08-09 MED ORDER — CEFDINIR 300 MG PO CAPS
300.0000 mg | ORAL_CAPSULE | Freq: Two times a day (BID) | ORAL | Status: DC
  Administered 2020-08-09: 300 mg via ORAL
  Filled 2020-08-09: qty 1

## 2020-08-09 NOTE — Discharge Summary (Signed)
Physician Discharge Summary  Warren [name] HYW:[id number] DOB: May 13, 1950 DOA: 08/05/2020  PCP: Imagene Riches, NP  Admit date: 08/05/2020 Discharge date: 08/09/2020  Time spent: 35 minutes  Recommendations for Outpatient Follow-up:  PCP in 1 week Cardiology Dr. 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 Admitted in April 2022 for septic shock secondary to cholangitis/Klebsiella bacteremia/choledocholithiasis. His hospital course complicated by new onset A. fib and was started on Eliquis.  -Presented to the ED 5/25 with complaints of nausea, dizziness, shortness of breath and a syncopal episode.  Past 2 weeks he has had severe nausea and poor appetite, minimal oral intake, last few days started feeling dizzy and he subsequently passed out yesterday.  Denies any abdominal pain, no vomiting or diarrhea, denies any fevers or chills, reports chronic dyspnea on exertion due to COPD with a recent productive cough -Labs were notable for WBC of 11, hemoglobin of 10.4, stable, high-sensitivity troponin was negative, EKG without acute changes, chest x-ray was concerning for bronchopneumonia/bronchitis.   Hospital Course:   Syncope, orthostatic hypotension -Positive  orthostatic vitals on admission, 2-week history of nausea, very poor oral intake followed by dizziness and lightheadedness -Recent echo in April noted moderate aortic stenosis, he was diagnosed with A. fib during recent hospitalization and started on Eliquis -No events on telemetry, no further symptoms at this point, ambulating independently -Follow-up with PCP in 1 week,  Community-acquired pneumonia/bronchopneumonia COPD exacerbation -History of productive cough, mild leukocytosis, chest x-ray concerning for bronchitis and bronchopneumonia -Influenza and COVID PCR negative -Treated with ceftriaxone, azithromycin and brief course of steroids -Transition to oral cefdinir and prednisone taper at discharge  Presumed CAD Mild to moderate aortic stenosis -Seen by cardiology just prior to this admission, further work-up deferred due to multiple comorbidities and overall frailty -Has trace edema, diuretics deferred in the setting of syncope and orthostatic hypotension this admission -follow-up with [facility] in 1 to 2 weeks, consider low-dose diuretics if edema persists  Nausea -Could have been secondary to recent antibiotics, was discharged on doxycycline following hospitalization in April for choledocholithiasis with bacteremia -CT abdomen pelvis on exam is unremarkable for intra-abdominal pathology -Improving with supportive care, continue to monitor  Hypokalemia -Repleted  COPD/chronic hypoxic respiratory failure -On 3 L home O2 at baseline -Continue nebs  Paroxysmal A. Fib Currently in sinus rhythm. Not on a rate control agent. -Continue Eliquis  GERD -Continue PPI  BPH -Continue Flomax  Chronic back pain -Continue home oxycodonePRN  Code Status: DO NOT RESUSCITATE    Discharge Exam: Vitals:   08/09/20 0529 08/09/20 0818  BP: 137/66   Pulse: 64   Resp: 16   Temp: 98.1 F (36.7 C)   SpO2: 93% 95%  General: AAOx3 Cardiovascular:  S1S2/RRR Respiratory: CTAB  Discharge Instructions   Discharge Instructions    Diet - low sodium heart healthy   Complete by: As directed    Increase activity slowly   Complete by: As directed      Allergies as of 08/09/2020   No Known Allergies     Medication List    TAKE these medications   albuterol 108 (90 Base) MCG/ACT inhaler Commonly known as: VENTOLIN HFA Inhale 2 puffs into the lungs every 4 (four) hours as needed for wheezing or shortness of breath.   apixaban 5 MG Tabs tablet Commonly known as: ELIQUIS Take 1 tablet (5 mg total) by mouth 2 (two) times daily.   cefdinir 300 MG capsule Commonly known as: OMNICEF Take 1 capsule (300 mg total) by mouth every 12 (twelve) hours for 3 days.   cyanocobalamin 1000 MCG tablet Take 1,000 mcg by mouth daily.   Daliresp 250 MCG Tabs Generic drug: Roflumilast Take 250 mcg by mouth daily.   donepezil 10 MG tablet Commonly known as: ARICEPT Take 10 mg by mouth daily.   fluticasone 50 MCG/ACT nasal spray Commonly known as: FLONASE Place 1 spray into both nostrils daily as needed for allergies or rhinitis.   Fluticasone-Umeclidin-Vilant 100-62.5-25 MCG/INH Aepb Inhale 1 puff into the lungs daily.   gabapentin 600 MG tablet Commonly known as: NEURONTIN Take 600 mg by mouth 2 (two) times daily.   IRON FORMULA PO Take 81 mg by mouth 2 (two) times daily.   omeprazole 40 MG capsule Commonly known as: PRILOSEC Take 1 capsule (40 mg total) by mouth daily.   ondansetron 4 MG disintegrating tablet Commonly known as: Zofran ODT Take 1 tablet (4 mg total) by mouth every 8 (eight) hours as needed for nausea or vomiting.   ondansetron 4 MG tablet Commonly known as: ZOFRAN Take 1 tablet (4 mg total) by mouth every 6 (six) hours as needed for nausea.   Oxycodone HCl 10 MG Tabs Take 10 mg by mouth 4 (four) times daily as needed (pain).   predniSONE 20 MG tablet Commonly known as: DELTASONE Take 1-2 tablets (20-40 mg  total) by mouth daily with breakfast. Take 40mg  daily for 2days then 20mg  daily for 2days Start taking on: Aug 10, 2020   tamsulosin 0.4 MG Caps capsule Commonly known as: FLOMAX Take 0.4 mg by mouth daily.   VITAMIN D (ERGOCALCIFEROL) PO Take 5,000 Units by mouth daily.      No Known Allergies  Follow-up Information    Imagene Riches, NP. Schedule an appointment as soon as possible for a visit in 1 week(s).   Contact information: [place] [address] [phone]        Nahser, Wonda Cheng, MD. Schedule an appointment as soon as possible for a visit in 2 week(s).   Specialty: Cardiology Contact information: [place] [address] [phone]                The results of significant diagnostics from this hospitalization (including imaging, microbiology, ancillary and laboratory) are listed below for reference.    Significant Diagnostic Studies: CT ABDOMEN PELVIS WO CONTRAST  Result Date: 08/06/2020 CLINICAL DATA:  Nausea, vomiting EXAM: CT ABDOMEN AND PELVIS WITHOUT CONTRAST TECHNIQUE: Multidetector CT imaging of the abdomen and pelvis was performed following the standard protocol without IV contrast. COMPARISON:  06/19/2020, CT chest 05/27/2016 FINDINGS: Lower chest: There is extensive bronchial wall thickening again noted within  the visualized lung bases bilaterally with peribronchial and centrilobular nodularity noted within the a peripheral right middle lobe in keeping with bronchiolitis. Nodularity within the visualized left lung base appears progressive. These findings can be seen in the setting of atypical infection or aspiration. These are new when compared to remote prior examination of 05/27/2016. Superimposed advanced emphysema is again noted. Extensive right coronary artery calcification. Global cardiac size within normal limits. Trace pericardial effusion. Hepatobiliary: No focal liver abnormality is seen. Status post  cholecystectomy. No biliary dilatation. Pancreas: Unremarkable Spleen: Unremarkable Adrenals/Urinary Tract: Adrenal glands are unremarkable. Kidneys are normal, without renal calculi, focal lesion, or hydronephrosis. Bladder is unremarkable. Stomach/Bowel: Severe sigmoid diverticulosis. The stomach, small bowel, and large bowel are otherwise unremarkable. No evidence of obstruction or focal inflammation. Appendix normal. No free intraperitoneal gas. Trace free fluid noted within the pelvis. Vascular/Lymphatic: Extensive aortoiliac atherosclerotic calcification. Particularly prominent atherosclerotic calcification is seen at the origin of the superior mesenteric artery and renal arteries bilaterally. While this almost certainly results in a hemodynamically significant stenosis of the celiac axis and right renal artery, the degree of stenosis is not well assessed on this non arteriographic study. No pathologic adenopathy within the abdomen and pelvis. Reproductive: Prostate is unremarkable. Other: No abdominal wall hernia. Left inguinal cord lipoma noted. Rectum unremarkable. Musculoskeletal: No acute bone abnormality. No lytic or blastic bone lesions. Osseous structures are age-appropriate. IMPRESSION: Extensive bronchial wall thickening and progressive peribronchial nodularity noted at the lung bases bilaterally in keeping with atypical infection or aspiration. Superimposed severe centrilobular emphysema. No acute intra-abdominal pathology identified. Severe sigmoid diverticulosis. Peripheral vascular disease. Aortic Atherosclerosis (ICD10-I70.0) and Emphysema (ICD10-J43.9). Electronically Signed   By: Fidela Salisbury MD   On: 08/06/2020 03:51   DG Chest 2 View  Result Date: 08/05/2020 CLINICAL DATA:  Chest pain, intermittent sharp chest pain for 2-3 days with increased shortness of breath. EXAM: CHEST - 2 VIEW COMPARISON:  June 23, 2020. FINDINGS: Electronic device projects over LEFT chest. EKG leads over the  chest. Signs of increased interstitial markings and pulmonary emphysema more pronounced interstitial changes and bronchial wall thickening than on previous imaging. Trachea midline. Cardiomediastinal contours and hilar structures are stable. No sign of effusion. Slight increased density in the LEFT retrocardiac region waxing and waning compared to previous studies. On limited assessment no acute skeletal process. IMPRESSION: Findings of suspected increased bronchial wall thickening may represent bronchopneumonia/bronchitis. Improved aeration still with persistent opacity at the LEFT lung base. Correlate with any risk for aspiration and consider follow-up PA and lateral chest radiograph to ensure resolution Electronically Signed   By: Zetta Bills M.D.   On: 08/05/2020 18:37   CT Head Wo Contrast  Result Date: 08/05/2020 CLINICAL DATA:  Loss of consciousness. EXAM: CT HEAD WITHOUT CONTRAST TECHNIQUE: Contiguous axial images were obtained from the base of the skull through the vertex without intravenous contrast. COMPARISON:  None. FINDINGS: Brain: No evidence of acute infarction, hemorrhage, hydrocephalus, extra-axial collection or mass lesion/mass effect. Vascular: No hyperdense vessel or unexpected calcification. Skull: Normal. Negative for fracture or focal lesion. Sinuses/Orbits: No acute finding. Other: None. IMPRESSION: No acute intracranial abnormality seen. Electronically Signed   By: Marijo Conception M.D.   On: 08/05/2020 19:17    Microbiology: Recent Results (from the past 240 hour(s))  Resp Panel by RT-PCR (Flu A&B, Covid) Nasopharyngeal Swab     Status: None   Collection Time: 08/05/20 10:48 PM   Specimen: Nasopharyngeal Swab; Nasopharyngeal(NP) swabs in vial transport medium  Result  Value Ref Range Status   SARS Coronavirus 2 by RT PCR NEGATIVE NEGATIVE Final    Comment: (NOTE) SARS-CoV-2 target nucleic acids are NOT DETECTED.  The SARS-CoV-2 RNA is generally detectable in upper  respiratory specimens during the acute phase of infection. The lowest concentration of SARS-CoV-2 viral copies this assay can detect is 138 copies/mL. A negative result does not preclude SARS-Cov-2 infection and should not be used as the sole basis for treatment or other patient management decisions. A negative result may occur with  improper specimen collection/handling, submission of specimen other than nasopharyngeal swab, presence of viral mutation(s) within the areas targeted by this assay, and inadequate number of viral copies(<138 copies/mL). A negative result must be combined with clinical observations, patient history, and epidemiological information. The expected result is Negative.  Fact Sheet for Patients:  EntrepreneurPulse.com.au  Fact Sheet for Healthcare Providers:  IncredibleEmployment.be  This test is no t yet approved or cleared by the [place] FDA and  has been authorized for detection and/or diagnosis of SARS-CoV-2 by FDA under an Emergency Use Authorization (EUA). This EUA will remain  in effect (meaning this test can be used) for the duration of the COVID-19 declaration under Section 564(b)(1) of the Act, 21 U.S.C.section 360bbb-3(b)(1), unless the authorization is terminated  or revoked sooner.       Influenza A by PCR NEGATIVE NEGATIVE Final   Influenza B by PCR NEGATIVE NEGATIVE Final    Comment: (NOTE) The Xpert Xpress SARS-CoV-2/FLU/RSV plus assay is intended as an aid in the diagnosis of influenza from Nasopharyngeal swab specimens and should not be used as a sole basis for treatment. Nasal washings and aspirates are unacceptable for Xpert Xpress SARS-CoV-2/FLU/RSV testing.  Fact Sheet for Patients: EntrepreneurPulse.com.au  Fact Sheet for Healthcare Providers: IncredibleEmployment.be  This test is not yet approved or cleared by the [place] FDA and has been  authorized for detection and/or diagnosis of SARS-CoV-2 by FDA under an Emergency Use Authorization (EUA). This EUA will remain in effect (meaning this test can be used) for the duration of the COVID-19 declaration under Section 564(b)(1) of the Act, 21 U.S.C. section 360bbb-3(b)(1), unless the authorization is terminated or revoked.  Performed at [facility], [place] [address], [place], [place] [address]   Culture, blood (routine x 2)     Status: None (Preliminary result)   Collection Time: 08/06/20  2:20 AM   Specimen: BLOOD  Result Value Ref Range Status   Specimen Description   Final    BLOOD RIGHT ANTECUBITAL Performed at [place] [address] [place], [place] [address]    Special Requests   Final    BOTTLES DRAWN AEROBIC AND ANAEROBIC Blood Culture adequate volume Performed at [place] [address], [place], [place] [address]    Culture   Final    NO GROWTH 3 DAYS Performed at [facility], [place] [address], [place], [place] [address]    Report Status PENDING  Incomplete  Culture, blood (routine x 2)     Status: None (Preliminary result)   Collection Time: 08/06/20  2:40 AM   Specimen: BLOOD RIGHT ARM  Result Value Ref Range Status   Specimen Description   Final    BLOOD RIGHT ARM Performed at [facility], [place] [address] [place], [place] [address]    Special Requests   Final    BOTTLES DRAWN AEROBIC AND ANAEROBIC Blood Culture adequate volume Performed at [facility], [place] [address], [place], [place] [address]    Culture   Final    NO GROWTH 3 DAYS Performed at [facility], [place] [address], [place], [place] [address]    Report Status PENDING  Incomplete     Labs: Basic Metabolic Panel: Recent Labs  Lab 08/05/20 1752 08/06/20 0240 08/07/20 0605 08/08/20 0435 08/09/20 0504  NA 133* 135 137 136 131*  K 2.9* 3.5 3.7 3.7 4.6  CL 97* 102 102 100 95*   CO2 27 26 31 30  32  GLUCOSE 129* 92 113* 106* 159*  BUN 7* 6* <5* <5* <5*  CREATININE 1.08 0.88 0.68 0.90 0.84  CALCIUM 8.8* 8.2* 8.7* 8.8* 9.0  MG  --  1.8  --   --   --    Liver Function Tests: Recent Labs  Lab 08/05/20 1809 08/07/20 0605  AST 14* 10*  ALT 6 7  ALKPHOS 91 70  BILITOT 0.6 0.2*  PROT 6.5 5.4*  ALBUMIN 2.8* 2.2*   No results for input(s): LIPASE, AMYLASE in the last 168 hours. No results for input(s): AMMONIA in the last 168 hours. CBC: Recent Labs  Lab 08/05/20 1752 08/06/20 0240 08/07/20 0605 08/08/20 0435 08/09/20 0504  WBC 11.0* 9.8 6.3 6.5 4.8  HGB 10.4* 8.8* 8.9* 8.6* 8.7*  HCT 33.2* 27.9* 28.4* 27.3* 27.2*  MCV 102.8* 101.1* 102.5* 102.2* 100.0  PLT 347 290 290 330 339   Cardiac Enzymes: No results for input(s): CKTOTAL, CKMB, CKMBINDEX, TROPONINI in the last 168 hours. BNP: BNP (last 3 results) Recent Labs    06/19/20 0727 06/23/20 0406  BNP 1,303.5* 1,505.3*    ProBNP (last 3 results) No results for input(s): PROBNP in the last 8760 hours.  CBG: No results for input(s): GLUCAP in the last 168 hours.     Signed:  Domenic Polite MD.  [facility] 08/09/2020, 1:46 PM

## 2020-08-11 ENCOUNTER — Other Ambulatory Visit: Payer: Self-pay

## 2020-08-11 ENCOUNTER — Ambulatory Visit (INDEPENDENT_AMBULATORY_CARE_PROVIDER_SITE_OTHER): Payer: Medicare Other | Admitting: Cardiovascular Disease

## 2020-08-11 ENCOUNTER — Encounter: Payer: Self-pay | Admitting: Cardiovascular Disease

## 2020-08-11 VITALS — BP 116/56 | HR 98 | Ht 71.0 in | Wt 186.2 lb

## 2020-08-11 DIAGNOSIS — I480 Paroxysmal atrial fibrillation: Secondary | ICD-10-CM | POA: Diagnosis not present

## 2020-08-11 DIAGNOSIS — E785 Hyperlipidemia, unspecified: Secondary | ICD-10-CM | POA: Diagnosis not present

## 2020-08-11 DIAGNOSIS — I359 Nonrheumatic aortic valve disorder, unspecified: Secondary | ICD-10-CM

## 2020-08-11 DIAGNOSIS — I739 Peripheral vascular disease, unspecified: Secondary | ICD-10-CM | POA: Diagnosis not present

## 2020-08-11 LAB — CULTURE, BLOOD (ROUTINE X 2)
Culture: NO GROWTH
Culture: NO GROWTH
Special Requests: ADEQUATE
Special Requests: ADEQUATE

## 2020-08-11 MED ORDER — ATORVASTATIN CALCIUM 40 MG PO TABS: 40.0000 mg | ORAL_TABLET | Freq: Every day | ORAL | 3 refills | Status: DC

## 2020-08-11 NOTE — Patient Instructions (Addendum)
Medication Instructions:  START Atorvastatin 40 mg once daily  *If you need a refill on your cardiac medications before your next appointment, please call your pharmacy*   Lab Work: Your provider would like for you to have the following labs today: lipid and liver  If you have labs (blood work) drawn today and your tests are completely normal, you will receive your results only by: Marland Kitchen MyChart Message (if you have MyChart) OR . A paper copy in the mail If you have any lab test that is abnormal or we need to change your treatment, we will call you to review the results.   Testing/Procedures: Your physician has requested that you have a lower extremity segmental doppler. This will take place at [facility], [address].  Follow-Up: At [facility], you and your health needs are our priority.  As part of our continuing mission to provide you with exceptional heart care, we have created designated Provider Care Teams.  These Care Teams include your primary Cardiologist (physician) and Advanced Practice Providers (APPs -  Physician Assistants and Nurse Practitioners) who all work together to provide you with the care you need, when you need it.  We recommend signing up for the patient portal called "MyChart".  Sign up information is provided on this After Visit Summary.  MyChart is used to connect with patients for Virtual Visits (Telemedicine).  Patients are able to view lab/test results, encounter notes, upcoming appointments, etc.  Non-urgent messages can be sent to your provider as well.   To learn more about what you can do with MyChart, go to [email or web address].    Your next appointment:   6 month(s)  The format for your next appointment:   In Person  Provider:   Kathlyn Sacramento, MD

## 2020-08-11 NOTE — Progress Notes (Signed)
Cardiology Office Note   Date:  08/11/2020   ID:  Warren [name], DOB Dec 18, 1950, MRN [id number]  PCP:  Imagene Riches, NP  Cardiologist:  Dr. Acie Fredrickson No chief complaint on file.     History of Present Illness: Warren [name] is a 70 y.o. male who was referred by Melina Copa for evaluation of peripheral arterial disease.  He has known history of COPD on home oxygen, GERD, essential hypertension, previous pancreatitis, pulmonary embolism in 2018, anxiety, chronic back pain, BPH, recently diagnosed paroxysmal atrial fibrillation, mild to moderate aortic stenosis and coronary artery calcifications noted on CT scan.  He had a prolonged admission in April with septic shock due to cholangitis, Klebsiella bacteremia, A. fib with RVR, elevated troponin thought to be due to supply demand ischemia, AKI and hyponatremia.  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  Echocardiogram showed an EF of 50 to 55% with mild to moderate aortic stenosis.  He was hospitalized again recently with nausea, vomiting and syncopal episodes thought to be due to orthostatic hypotension.  He was diagnosed with pneumonia and was treated.  The patient's physical capacity is limited due to COPD.  He is on 3 L of oxygen.  He denies exertional leg pain.  He has no lower extremity ulceration.  He quit smoking 4 years ago.   Past Medical History:  Diagnosis Date  . Allergy   . Anemia   . Aortic stenosis   . COPD (chronic obstructive pulmonary disease) ([place])   . Elevated troponin   . GERD (gastroesophageal reflux disease)   . History of pulmonary embolus (PE)   . Hypertension   . Insomnia   . OSA (obstructive sleep apnea)   . PAD (peripheral artery disease) ([place])    by CT imaging  . PAF (paroxysmal atrial fibrillation) ([place])   . Septic shock ([place])   . Superficial venous thrombosis of arm      Past Surgical History:  Procedure Laterality Date  . CHOLECYSTECTOMY    . ERCP N/A 06/20/2020   Procedure: ENDOSCOPIC RETROGRADE CHOLANGIOPANCREATOGRAPHY (ERCP);  Surgeon: Milus Banister, MD;  Location: [facility];  Service: Endoscopy;  Laterality: N/A;  . SPHINCTEROTOMY  06/20/2020   Procedure: SPHINCTEROTOMY;  Surgeon: Milus Banister, MD;  Location: [facility];  Service: Endoscopy;;  Balloon Sweep-No Stones     Current Outpatient Medications  Medication Sig Dispense Refill  . albuterol (VENTOLIN HFA) 108 (90 Base) MCG/ACT inhaler Inhale 2 puffs into the lungs every 4 (four) hours as needed for wheezing or shortness of breath.    Marland Kitchen apixaban (ELIQUIS) 5 MG TABS tablet Take 1 tablet (5 mg total) by mouth 2 (two) times daily. 60 tablet 2  . atorvastatin (LIPITOR) 40 MG tablet Take 1 tablet (40 mg total) by mouth daily. 90 tablet 3  . cefdinir (OMNICEF) 300 MG capsule Take 1 capsule (300 mg total) by mouth every 12 (twelve) hours for 3 days. 6 capsule 0  . cyanocobalamin 1000 MCG tablet Take 1,000 mcg by mouth daily.    Marland Kitchen DALIRESP 250 MCG TABS Take 250 mcg by mouth daily.    Marland Kitchen donepezil (ARICEPT) 10 MG tablet Take 10 mg by mouth daily.    . fluticasone (FLONASE) 50 MCG/ACT nasal spray Place 1 spray into both nostrils daily as needed for allergies or rhinitis.    . Fluticasone-Umeclidin-Vilant 100-62.5-25 MCG/INH  AEPB Inhale 1 puff into the lungs daily.    Marland Kitchen gabapentin (NEURONTIN) 600 MG tablet Take 600 mg by mouth 2 (two) times daily.    . Iron-Folic Acid-Vit Q22 (IRON FORMULA PO) Take 81 mg by mouth 2 (two) times daily.    Marland Kitchen omeprazole (PRILOSEC) 40 MG capsule Take 1 capsule (40 mg total) by mouth daily. 90 capsule 3  . ondansetron (ZOFRAN ODT) 4 MG disintegrating tablet Take 1 tablet (4 mg total) by mouth every 8 (eight) hours as needed for nausea or vomiting. 20 tablet 0  . ondansetron (ZOFRAN) 4 MG tablet Take 1 tablet (4 mg total) by mouth every 6 (six) hours as needed for  nausea. 20 tablet 0  . Oxycodone HCl 10 MG TABS Take 10 mg by mouth 4 (four) times daily as needed (pain).    . predniSONE (DELTASONE) 20 MG tablet Take 1-2 tablets (20-40 mg total) by mouth daily with breakfast. Take 40mg  daily for 2days then 20mg  daily for 2days 6 tablet 0  . tamsulosin (FLOMAX) 0.4 MG CAPS capsule Take 0.4 mg by mouth in the morning and at bedtime.    Marland Kitchen VITAMIN D, ERGOCALCIFEROL, PO Take 5,000 Units by mouth daily.     No current facility-administered medications for this visit.    Allergies:   Patient has no known allergies.    Social History:  The patient  reports that he quit smoking about 5 years ago. He has never used smokeless tobacco. He reports previous alcohol use.   Family History:  The patient's family history is negative for coronary artery disease.   ROS:  Please see the history of present illness.   Otherwise, review of systems are positive for none.   All other systems are reviewed and negative.    PHYSICAL EXAM: VS:  BP (!) 116/56 (BP Location: Right Arm, Patient Position: Sitting, Cuff Size: Normal)   Pulse 98   Ht 5\' 11"  (1.803 m)   Wt 186 lb 3.2 oz (84.5 kg)   BMI 25.97 kg/m  , BMI Body mass index is 25.97 kg/m. GEN: Well nourished, well developed, in no acute distress  HEENT: normal  Neck: no JVD, carotid bruits, or masses Cardiac: RRR; no  rubs, or gallops, 2 out of 6 systolic murmur in the aortic area.  Moderate bilateral leg edema Respiratory:  clear to auscultation bilaterally, normal work of breathing GI: soft, nontender, nondistended, + BS MS: no deformity or atrophy  Skin: warm and dry, no rash Neuro:  Strength and sensation are intact Psych: euthymic mood, full affect Vascular: Femoral pulses +2 bilaterally.  Distal pulses are palpable.   EKG:  EKG is not ordered today.   Recent Labs: 06/23/2020: B Natriuretic Peptide 1,505.3 07/20/2020: TSH 0.978 08/06/2020: Magnesium 1.8 08/07/2020: ALT 7 08/09/2020: BUN <5; Creatinine, Ser  0.84; Hemoglobin 8.7; Platelets 339; Potassium 4.6; Sodium 131    Lipid Panel    Component Value Date/Time   CHOL 212 (H) 07/20/2020 1238   TRIG 92 07/20/2020 1238   HDL 75 07/20/2020 1238   CHOLHDL 2.8 07/20/2020 1238   CHOLHDL 3.9 Ratio 03/22/2007 2217   VLDL 16 03/22/2007 2217   LDLCALC 121 (H) 07/20/2020 1238      Wt Readings from Last 3 Encounters:  08/11/20 186 lb 3.2 oz (84.5 kg)  08/06/20 188 lb 11.4 oz (85.6 kg)  07/20/20 185 lb (83.9 kg)       No flowsheet data found.    ASSESSMENT AND PLAN:  1.  Peripheral arterial disease: The patient has evidence of peripheral arterial disease based on CT results.  However, this is likely nonobstructive based on his symptoms and physical exam.  I am going to obtain lower extremity arterial Doppler to evaluate distal flow but I doubt that he will require revascularization at the present time. Recommend treatment of risk factors.  2.  Paroxysmal atrial fibrillation: He seems to be in sinus rhythm.  He is currently on Eliquis for anticoagulation.  3.  Aortic stenosis: Mild to moderate by most recent echo.  He might not be a good candidate for TAVR in the future given underlying lung disease.  4. COPD on home oxygen: Recent hospitalization for pneumonia and was discharged on prednisone.  5. Celiac/mesenteric artery stenosis: He denies postprandial pain or significant weight loss.  He does not have symptoms of chronic mesenteric ischemia.  Recommend medical therapy.  6.  Hyperlipidemia: Recent lipid profile showed an LDL of 121.  Given his extensive vascular disease, recommend treatment with a statin target LDL of less than 70.  I added atorvastatin today.  Check lipid and liver profile in 2 months.   Disposition:   FU with me in 6 months  Signed,  Kathlyn Sacramento, MD  08/11/2020 12:44 PM    [place]

## 2020-08-12 ENCOUNTER — Other Ambulatory Visit: Payer: Self-pay | Admitting: *Deleted

## 2020-08-12 ENCOUNTER — Encounter: Payer: Self-pay | Admitting: *Deleted

## 2020-08-12 DIAGNOSIS — I739 Peripheral vascular disease, unspecified: Secondary | ICD-10-CM

## 2020-08-12 DIAGNOSIS — E785 Hyperlipidemia, unspecified: Secondary | ICD-10-CM

## 2020-08-12 LAB — HEPATIC FUNCTION PANEL
ALT: 15 IU/L (ref 0–44)
AST: 24 IU/L (ref 0–40)
Albumin: 3.2 g/dL — ABNORMAL LOW (ref 3.7–4.7)
Alkaline Phosphatase: 93 IU/L (ref 44–121)
Bilirubin Total: 0.2 mg/dL (ref 0.0–1.2)
Bilirubin, Direct: 0.11 mg/dL (ref 0.00–0.40)
Total Protein: 5.9 g/dL — ABNORMAL LOW (ref 6.0–8.5)

## 2020-08-12 LAB — LIPID PANEL
Chol/HDL Ratio: 2.6 ratio (ref 0.0–5.0)
Cholesterol, Total: 178 mg/dL (ref 100–199)
HDL: 69 mg/dL (ref >39)
LDL Chol Calc (NIH): 82 mg/dL (ref 0–99)
Triglycerides: 157 mg/dL — ABNORMAL HIGH (ref 0–149)
VLDL Cholesterol Cal: 27 mg/dL (ref 5–40)

## 2020-08-13 ENCOUNTER — Other Ambulatory Visit (HOSPITAL_COMMUNITY): Payer: Self-pay | Admitting: Cardiovascular Disease

## 2020-08-13 DIAGNOSIS — I739 Peripheral vascular disease, unspecified: Secondary | ICD-10-CM

## 2020-08-24 ENCOUNTER — Ambulatory Visit (HOSPITAL_COMMUNITY)
Admission: RE | Admit: 2020-08-24 | Discharge: 2020-08-24 | Disposition: A | Discharge status: Home / Self Care | Payer: Medicare Other | Source: Ambulatory Visit | Attending: Cardiology | Admitting: Cardiology

## 2020-08-24 ENCOUNTER — Other Ambulatory Visit: Payer: Self-pay

## 2020-08-24 DIAGNOSIS — E785 Hyperlipidemia, unspecified: Secondary | ICD-10-CM | POA: Diagnosis present

## 2020-08-24 DIAGNOSIS — I739 Peripheral vascular disease, unspecified: Secondary | ICD-10-CM | POA: Insufficient documentation

## 2020-08-26 ENCOUNTER — Encounter: Payer: Self-pay | Admitting: *Deleted

## 2020-09-03 ENCOUNTER — Telehealth: Payer: Self-pay | Admitting: Cardiovascular Disease

## 2020-09-03 NOTE — Telephone Encounter (Signed)
Esther from [employer]. Car.called to see if she could get the results of patient Vas [place] results. Please call back

## 2020-09-28 ENCOUNTER — Encounter: Payer: Self-pay | Admitting: Cardiovascular Disease

## 2020-09-28 NOTE — Progress Notes (Signed)
Cardiology Office Note:    Date:  09/29/2020   ID:  Warren [name], [name] 21-Jul-1950, MRN [id number]  PCP:  Imagene Riches, NP   [facility] Providers Cardiologist:  Mertie Moores, MD     Referring MD: Imagene Riches, NP   Chief Complaint  Patient presents with   Hyperlipidemia    September 29, 2020   Warren [name] is a 70 y.o. male with a hx of A-fib, pulmonary embolus, who is seen today for follow up I met him in the hospital in April , 2022.  He had rapid Afib when I initially met him.  This was in the setting of a common bile duct stone with cholangitis and sepsis + troponins due to demand ischemia   He has a history of COPD and is on home oxygen.  He has a history of hypertension, previous pancreatitis, history of pulmonary embolism in 2018, anxiety, chronic back pain  He has claudication.  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  Still having some issues with pancreatitis/ abdominal pain .  Still on the eliquis  Needs to make an appt with the dentist.   Most of his teeth have been pulled.   Still has some fragments. That will need to be pulled.   He is at low risk for any upcoming dental procedures.   He does not need SBE prophylasix   Past Medical History:  Diagnosis Date   Allergy    Anemia    Aortic stenosis    COPD (chronic obstructive pulmonary disease) (HCC)    Elevated troponin    GERD (gastroesophageal reflux disease)    History of pulmonary embolus (PE)    Hypertension    Insomnia    OSA (obstructive sleep apnea)    PAD (peripheral artery disease) (HCC)    by CT imaging   PAF (paroxysmal atrial fibrillation) (HCC)    Septic shock (HCC)    Superficial venous thrombosis of arm     Past Surgical History:  Procedure Laterality Date   CHOLECYSTECTOMY     ERCP N/A 06/20/2020   Procedure: ENDOSCOPIC RETROGRADE CHOLANGIOPANCREATOGRAPHY (ERCP);   Surgeon: Milus Banister, MD;  Location: [facility];  Service: Endoscopy;  Laterality: N/A;   SPHINCTEROTOMY  06/20/2020   Procedure: SPHINCTEROTOMY;  Surgeon: Milus Banister, MD;  Location: [facility];  Service: Endoscopy;;  Balloon Sweep-No Stones    Current Medications: Current Meds  Medication Sig   albuterol (VENTOLIN HFA) 108 (90 Base) MCG/ACT inhaler Inhale 2 puffs into the lungs every 4 (four) hours as needed for wheezing or shortness of breath.   apixaban (ELIQUIS) 5 MG TABS tablet Take 1 tablet (5 mg total) by mouth 2 (two) times daily.   atorvastatin (LIPITOR) 40 MG tablet Take 1 tablet (40 mg total) by mouth daily.   cyanocobalamin 1000 MCG tablet Take 1,000 mcg by mouth daily.   DALIRESP 250 MCG TABS Take 250 mcg by mouth daily.   donepezil (ARICEPT) 10 MG tablet Take 10 mg by mouth daily.   fluticasone (FLONASE) 50 MCG/ACT nasal spray Place 1 spray into both nostrils daily as needed for allergies or rhinitis.   Fluticasone-Umeclidin-Vilant 100-62.5-25 MCG/INH AEPB Inhale 1 puff into the lungs daily.   gabapentin (NEURONTIN) 600 MG tablet Take 600 mg by mouth 2 (two) times daily.   Iron-Folic Acid-Vit Q94 (IRON FORMULA PO) Take 81 mg by  mouth 2 (two) times daily.   omeprazole (PRILOSEC) 40 MG capsule Take 1 capsule (40 mg total) by mouth daily.   ondansetron (ZOFRAN ODT) 4 MG disintegrating tablet Take 1 tablet (4 mg total) by mouth every 8 (eight) hours as needed for nausea or vomiting.   ondansetron (ZOFRAN) 4 MG tablet Take 1 tablet (4 mg total) by mouth every 6 (six) hours as needed for nausea.   Oxycodone HCl 10 MG TABS Take 10 mg by mouth 4 (four) times daily as needed (pain).   predniSONE (DELTASONE) 20 MG tablet Take 1-2 tablets (20-40 mg total) by mouth daily with breakfast. Take 31m daily for 2days then 221mdaily for 2days   tamsulosin (FLOMAX) 0.4 MG CAPS capsule Take 0.4 mg by mouth in the morning and at bedtime.   traZODone (DESYREL) 50 MG tablet Take 50 mg  by mouth at bedtime.   VITAMIN D, ERGOCALCIFEROL, PO Take 5,000 Units by mouth daily.     Allergies:   Patient has no known allergies.   Social History   Socioeconomic History   Marital status: Divorced    Spouse name: Not on file   Number of children: Not on file   Years of education: Not on file   Highest education level: Not on file  Occupational History   Not on file  Tobacco Use   Smoking status: Former    Types: Cigarettes    Quit date: 03/15/2015    Years since quitting: 5.5   Smokeless tobacco: Never  Substance and Sexual Activity   Alcohol use: Not Currently   Drug use: Not on file   Sexual activity: Not on file  Other Topics Concern   Not on file  Social History Narrative   Not on file   Social Determinants of Health   Financial Resource Strain: Not on file  Food Insecurity: Not on file  Transportation Needs: Not on file  Physical Activity: Not on file  Stress: Not on file  Social Connections: Not on file     Family History: The patient's family history is negative for Colon cancer.  ROS:   Please see the history of present illness.     All other systems reviewed and are negative.  EKGs/Labs/Other Studies Reviewed:    The following studies were reviewed today:   EKG:     Recent Labs: 06/23/2020: B Natriuretic Peptide 1,505.3 07/20/2020: TSH 0.978 08/06/2020: Magnesium 1.8 08/09/2020: BUN <5; Creatinine, Ser 0.84; Hemoglobin 8.7; Platelets 339; Potassium 4.6; Sodium 131 08/11/2020: ALT 15  Recent Lipid Panel    Component Value Date/Time   CHOL 178 08/11/2020 1258   TRIG 157 (H) 08/11/2020 1258   HDL 69 08/11/2020 1258   CHOLHDL 2.6 08/11/2020 1258   CHOLHDL 3.9 Ratio 03/22/2007 2217   VLDL 16 03/22/2007 2217   LDLCALC 82 08/11/2020 1258     Risk Assessment/Calculations:    CHA2DS2-VASc Score = 3  This indicates a 3.2% annual risk of stroke. The patient's score is based upon: CHF History: No HTN History: Yes Diabetes History: No Stroke  History: No Vascular Disease History: Yes Age Score: 1 Gender Score: 0          Physical Exam:    VS:  BP 120/60   Pulse 76   Ht 5' 11" (1.803 m)   Wt 181 lb 6.4 oz (82.3 kg)   SpO2 96%   BMI 25.30 kg/m     Wt Readings from Last 3 Encounters:  09/29/20 181 lb 6.4  oz (82.3 kg)  08/11/20 186 lb 3.2 oz (84.5 kg)  08/06/20 188 lb 11.4 oz (85.6 kg)     GEN:  chronically ill appearing man,  NAD  HEENT: Normal NECK: No JVD; No carotid bruits LYMPHATICS: No lymphadenopathy CARDIAC: RRR,  occasional premature beats ,  soft systlic murmur  RESPIRATORY:  Clear to auscultation without rales, wheezing or rhonchi  ABDOMEN: Soft, non-tender, non-distended MUSCULOSKELETAL:  No edema; No deformity  SKIN: Warm and dry NEUROLOGIC:  Alert and oriented x 3 PSYCHIATRIC:  Normal affect   ASSESSMENT:    1. Atrial fibrillation with rapid ventricular response (HCC)    PLAN:    In order of problems listed above:  PAF:   He is doing well.  Clinically it sounds like he is in sinus rhythm.  He remains on Eliquis.  He needs to have some dental work.  He has several teeth fragments that will need to be removed.  He is at low risk for any upcoming dental procedure.  It would be okay if he holds his Eliquis for 2 days prior to his dental procedure.  He should then restart his Eliquis the day after his dental procedure.  This should minimize the risk of bleeding.     2.  PAD :  asymptomatic - but likely because he is not very active.   3.  COPD :     Medication Adjustments/Labs and Tests Ordered: Current medicines are reviewed at length with the patient today.  Concerns regarding medicines are outlined above.  No orders of the defined types were placed in this encounter.  No orders of the defined types were placed in this encounter.   Patient Instructions  Medication Instructions:  Your physician recommends that you continue on your current medications as directed. Please refer to the  Current Medication list given to you today.  *If you need a refill on your cardiac medications before your next appointment, please call your pharmacy*   Lab Work: None today If you have labs (blood work) drawn today and your tests are completely normal, you will receive your results only by: [place] (if you have MyChart) OR A paper copy in the mail If you have any lab test that is abnormal or we need to change your treatment, we will call you to review the results.   Testing/Procedures: None today   Follow-Up: At [facility], you and your health needs are our priority.  As part of our continuing mission to provide you with exceptional heart care, we have created designated Provider Care Teams.  These Care Teams include your primary Cardiologist (physician) and Advanced Practice Providers (APPs -  Physician Assistants and Nurse Practitioners) who all work together to provide you with the care you need, when you need it.  We recommend signing up for the patient portal called "MyChart".  Sign up information is provided on this After Visit Summary.  MyChart is used to connect with patients for Virtual Visits (Telemedicine).  Patients are able to view lab/test results, encounter notes, upcoming appointments, etc.  Non-urgent messages can be sent to your provider as well.   To learn more about what you can do with MyChart, go to [email or web address].    Your next appointment:   1 year(s)  The format for your next appointment:   In Person  Provider:   Mertie Moores, MD   Other Instructions None today     Signed, Mertie Moores, MD  09/29/2020 8:43 PM  [facility]

## 2020-09-29 ENCOUNTER — Encounter: Payer: Self-pay | Admitting: Cardiovascular Disease

## 2020-09-29 ENCOUNTER — Ambulatory Visit (INDEPENDENT_AMBULATORY_CARE_PROVIDER_SITE_OTHER): Payer: Medicare Other | Admitting: Cardiovascular Disease

## 2020-09-29 ENCOUNTER — Other Ambulatory Visit: Payer: Self-pay

## 2020-09-29 VITALS — BP 120/60 | HR 76 | Ht 71.0 in | Wt 181.4 lb

## 2020-09-29 DIAGNOSIS — I4891 Unspecified atrial fibrillation: Secondary | ICD-10-CM

## 2020-09-29 NOTE — Patient Instructions (Signed)
Medication Instructions:  Your physician recommends that you continue on your current medications as directed. Please refer to the Current Medication list given to you today.  *If you need a refill on your cardiac medications before your next appointment, please call your pharmacy*   Lab Work: None today If you have labs (blood work) drawn today and your tests are completely normal, you will receive your results only by: [place] (if you have MyChart) OR A paper copy in the mail If you have any lab test that is abnormal or we need to change your treatment, we will call you to review the results.   Testing/Procedures: None today   Follow-Up: At [facility], you and your health needs are our priority.  As part of our continuing mission to provide you with exceptional heart care, we have created designated Provider Care Teams.  These Care Teams include your primary Cardiologist (physician) and Advanced Practice Providers (APPs -  Physician Assistants and Nurse Practitioners) who all work together to provide you with the care you need, when you need it.  We recommend signing up for the patient portal called "MyChart".  Sign up information is provided on this After Visit Summary.  MyChart is used to connect with patients for Virtual Visits (Telemedicine).  Patients are able to view lab/test results, encounter notes, upcoming appointments, etc.  Non-urgent messages can be sent to your provider as well.   To learn more about what you can do with MyChart, go to [email or web address].    Your next appointment:   1 year(s)  The format for your next appointment:   In Person  Provider:   Mertie Moores, MD   Other Instructions None today

## 2021-02-02 ENCOUNTER — Telehealth: Payer: Self-pay | Admitting: *Deleted

## 2021-02-02 NOTE — Telephone Encounter (Signed)
Pharmacy, can you please comment on how long Eliquis can be held for upcoming procedure?  Thank you! 

## 2021-02-02 NOTE — Telephone Encounter (Signed)
   Pre-operative Risk Assessment    Patient Name: Warren [name]  DOB: 19-[name]-1951 MRN: [id number]      Request for Surgical Clearance   Procedure:   BLEPHAROPLASTY, B/L UPPER LIDS WITH FAT PAD/BUL INTERNAL PTOSIS REPAIR/BLL ECTROPION REPAIR BY LTS  Date of Surgery: Clearance 02/12/21                                 Surgeon:  DR. Olive Bass Surgeon's Group or Practice Name:  [facility] Phone number:  [phone] Fax number:  [phone]   Type of Clearance Requested: - Medical  - Pharmacy:  Hold Apixaban (Eliquis) x 2 DAYS PRIOR TO PROCEDURE    Type of Anesthesia:   IV SEDATION   Additional requests/questions:   [name]   02/02/2021, 11:09 AM

## 2021-02-02 NOTE — Telephone Encounter (Signed)
Patient with diagnosis of afib on Eliquis for anticoagulation.    Procedure:  BLEPHAROPLASTY, B/L UPPER LIDS WITH FAT PAD/BUL INTERNAL PTOSIS REPAIR/BLL ECTROPION REPAIR BY LTS Date of procedure: 02/12/21  CHA2DS2-VASc Score = 3   This indicates a 3.2% annual risk of stroke. The patient's score is based upon: CHF History: 0 HTN History: 1 Diabetes History: 0 Stroke History: 0 Vascular Disease History: 1 Age Score: 1 Gender Score: 0     CrCl 84 ml/min  Patient has hx of single PE in 2018. Previous cleared to hold Eliquis 2 days.  Per office protocol, patient can hold Eliquis for 2 days prior to procedure.

## 2021-02-03 NOTE — Telephone Encounter (Signed)
Left message to call back and ask to speak with pre-op team.  Darreld Mclean, PA-C 02/03/2021 9:04 AM

## 2021-02-11 NOTE — Telephone Encounter (Signed)
    Patient Name: Warren [name]  DOB: 09-30-1950 MRN: [id number]  Primary Cardiologist: Mertie Moores, MD  Chart reviewed as part of pre-operative protocol coverage. Patient patient last seen by Dr. Acie Fredrickson in 09/2020. He was contacted today for further pre-op evaluation and reported doing well since last visit. He has chronic dyspnea due to his COPD but states this is actually much better since he has lost 50 lbs. No chest pain, orthopnea, PND, palpitations, dizziness, or syncope. He is able to complete >4.0 METS without any chest pain. Given past medical history and time since last visit, based on ACC/AHA guidelines, Warren [name] would be at acceptable risk for the planned procedure without further cardiovascular testing.   Per Pharmacy and office protocol, patient can hold Eliquis for 2 days prior to procedure. Please restart this as soon as possible following procedure.   I will route this recommendation to the requesting party via Epic fax function and remove from pre-op pool.  Please call with questions.  Darreld Mclean, PA-C 02/11/2021, 4:21 PM

## 2021-05-04 ENCOUNTER — Encounter: Payer: Self-pay | Admitting: Cardiovascular Disease

## 2021-05-04 ENCOUNTER — Ambulatory Visit (INDEPENDENT_AMBULATORY_CARE_PROVIDER_SITE_OTHER): Payer: Medicare (Managed Care) | Admitting: Cardiovascular Disease

## 2021-05-04 ENCOUNTER — Other Ambulatory Visit: Payer: Self-pay

## 2021-05-04 VITALS — BP 112/64 | HR 69 | Ht 71.0 in | Wt 155.4 lb

## 2021-05-04 DIAGNOSIS — I480 Paroxysmal atrial fibrillation: Secondary | ICD-10-CM | POA: Diagnosis not present

## 2021-05-04 DIAGNOSIS — E785 Hyperlipidemia, unspecified: Secondary | ICD-10-CM

## 2021-05-04 DIAGNOSIS — I350 Nonrheumatic aortic (valve) stenosis: Secondary | ICD-10-CM | POA: Diagnosis not present

## 2021-05-04 DIAGNOSIS — I739 Peripheral vascular disease, unspecified: Secondary | ICD-10-CM | POA: Diagnosis not present

## 2021-05-04 NOTE — Patient Instructions (Signed)

## 2021-05-04 NOTE — Progress Notes (Signed)
Cardiology Office Note   Date:  05/04/2021   ID:  Bubba L [name], DOB 04-01-1950, MRN [id number]  PCP:  Imagene Riches, NP  Cardiologist:  Dr. Acie Fredrickson No chief complaint on file.     History of Present Illness: Warren [name] is a 71 y.o. male who is here today for follow-up visit regarding peripheral arterial disease.    He has known history of COPD on home oxygen at night, GERD, essential hypertension, previous pancreatitis, pulmonary embolism in 2018, anxiety, chronic back pain, BPH,  paroxysmal atrial fibrillation,  aortic stenosis and coronary artery calcifications noted on CT scan.  He had a prolonged admission in April,2022 with septic shock due to cholangitis, Klebsiella bacteremia, A. fib with RVR, elevated troponin thought to be due to supply demand ischemia, AKI and hyponatremia.  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  Echocardiogram showed an EF of 50 to 55% with mild to moderate aortic stenosis.  He was hospitalized again in May with nausea, vomiting and syncopal episodes thought to be due to orthostatic hypotension.  He was diagnosed with pneumonia and was treated.  Upon initial evaluation, the patient had no convincing symptoms of claudication and his pulses were normal.  I referred him for lower extremity arterial Doppler which showed normal ABI and toe pressures bilaterally with triphasic waveform.  He has been doing reasonably well overall.  He has no claudication and no lower extremity ulceration.   Past Medical History:  Diagnosis Date   Allergy    Anemia    Aortic stenosis    COPD (chronic obstructive pulmonary disease) (HCC)    Elevated troponin    GERD (gastroesophageal reflux disease)    History of pulmonary embolus (PE)    Hypertension    Insomnia    OSA (obstructive sleep apnea)    PAD (peripheral artery disease) (HCC)     by CT imaging   PAF (paroxysmal atrial fibrillation) (HCC)    Septic shock (HCC)    Superficial venous thrombosis of arm     Past Surgical History:  Procedure Laterality Date   CHOLECYSTECTOMY     ERCP N/A 06/20/2020   Procedure: ENDOSCOPIC RETROGRADE CHOLANGIOPANCREATOGRAPHY (ERCP);  Surgeon: Milus Banister, MD;  Location: [facility];  Service: Endoscopy;  Laterality: N/A;   SPHINCTEROTOMY  06/20/2020   Procedure: SPHINCTEROTOMY;  Surgeon: Milus Banister, MD;  Location: [facility];  Service: Endoscopy;;  Balloon Sweep-No Stones     Current Outpatient Medications  Medication Sig Dispense Refill   albuterol (VENTOLIN HFA) 108 (90 Base) MCG/ACT inhaler Inhale 2 puffs into the lungs every 4 (four) hours as needed for wheezing or shortness of breath.     apixaban (ELIQUIS) 5 MG TABS tablet Take 1 tablet (5 mg total) by mouth 2 (two) times daily. 60 tablet 2   atorvastatin (LIPITOR) 40 MG tablet Take 1 tablet (40 mg total) by mouth daily. 90 tablet 3   cyanocobalamin 1000 MCG tablet Take 1,000 mcg by mouth daily.     DALIRESP 250 MCG TABS Take 250 mcg by mouth daily.     donepezil (ARICEPT) 10 MG tablet Take 10 mg by mouth daily.     fluticasone (FLONASE) 50 MCG/ACT nasal spray Place 1 spray into both nostrils daily as needed for allergies or rhinitis.     Fluticasone-Umeclidin-Vilant 100-62.5-25 MCG/INH AEPB Inhale 1 puff into the lungs  daily.     gabapentin (NEURONTIN) 600 MG tablet Take 600 mg by mouth 2 (two) times daily.     Iron-Folic Acid-Vit R48 (IRON FORMULA PO) Take 81 mg by mouth 2 (two) times daily.     omeprazole (PRILOSEC) 40 MG capsule Take 1 capsule (40 mg total) by mouth daily. 90 capsule 3   ondansetron (ZOFRAN ODT) 4 MG disintegrating tablet Take 1 tablet (4 mg total) by mouth every 8 (eight) hours as needed for nausea or vomiting. 20 tablet 0   ondansetron (ZOFRAN) 4 MG tablet Take 1 tablet (4 mg total) by mouth every 6 (six) hours as needed for nausea. 20 tablet 0    Oxycodone HCl 10 MG TABS Take 10 mg by mouth 4 (four) times daily as needed (pain).     promethazine (PHENERGAN) 25 MG tablet Take 25 mg by mouth every 8 (eight) hours as needed.     RESTASIS 0.05 % ophthalmic emulsion      tamsulosin (FLOMAX) 0.4 MG CAPS capsule Take 0.4 mg by mouth in the morning and at bedtime.     traZODone (DESYREL) 50 MG tablet Take 50 mg by mouth at bedtime.     triamcinolone ointment (KENALOG) 0.1 % SMARTSIG:2 Topical Twice Daily     VITAMIN D, ERGOCALCIFEROL, PO Take 5,000 Units by mouth daily.     nystatin cream (MYCOSTATIN) Apply 1 application topically 2 (two) times daily.     predniSONE (DELTASONE) 20 MG tablet Take 1-2 tablets (20-40 mg total) by mouth daily with breakfast. Take 40mg  daily for 2days then 20mg  daily for 2days (Patient not taking: Reported on 05/04/2021) 6 tablet 0   No current facility-administered medications for this visit.    Allergies:   Patient has no known allergies.    Social History:  The patient  reports that he quit smoking about 6 years ago. His smoking use included cigarettes. He has never used smokeless tobacco. He reports that he does not currently use alcohol.   Family History:  The patient's family history is negative for coronary artery disease.   ROS:  Please see the history of present illness.   Otherwise, review of systems are positive for none.   All other systems are reviewed and negative.    PHYSICAL EXAM: VS:  BP 112/64    Pulse 69    Ht 5\' 11"  (1.803 m)    Wt 155 lb 6.4 oz (70.5 kg)    SpO2 93%    BMI 21.67 kg/m  , BMI Body mass index is 21.67 kg/m. GEN: Well nourished, well developed, in no acute distress  HEENT: normal  Neck: no JVD, carotid bruits, or masses Cardiac: RRR; no  rubs, or gallops, 2 out of 6 systolic murmur in the aortic area.  Moderate bilateral leg edema Respiratory:  clear to auscultation bilaterally, normal work of breathing GI: soft, nontender, nondistended, + BS MS: no deformity or atrophy   Skin: warm and dry, no rash Neuro:  Strength and sensation are intact Psych: euthymic mood, full affect Vascular: Femoral pulses +2 bilaterally.  Distal pulses are palpable.   EKG:  EKG is not ordered today. EKG showed sinus rhythm with a heart rate of 69 bpm, LVH.  Recent Labs: 06/23/2020: B Natriuretic Peptide 1,505.3 07/20/2020: TSH 0.978 08/06/2020: Magnesium 1.8 08/09/2020: BUN <5; Creatinine, Ser 0.84; Hemoglobin 8.7; Platelets 339; Potassium 4.6; Sodium 131 08/11/2020: ALT 15    Lipid Panel    Component Value Date/Time   CHOL 178 08/11/2020 1258  TRIG 157 (H) 08/11/2020 1258   HDL 69 08/11/2020 1258   CHOLHDL 2.6 08/11/2020 1258   CHOLHDL 3.9 Ratio 03/22/2007 2217   VLDL 16 03/22/2007 2217   LDLCALC 82 08/11/2020 1258      Wt Readings from Last 3 Encounters:  05/04/21 155 lb 6.4 oz (70.5 kg)  09/29/20 181 lb 6.4 oz (82.3 kg)  08/11/20 186 lb 3.2 oz (84.5 kg)       No flowsheet data found.    ASSESSMENT AND PLAN:  1.  Peripheral arterial disease: The patient has evidence of peripheral arterial disease based on CT results.  However, this does not seem to be obstructive as he has normal pulses and also his ABI was normal.  No revascularization is needed.  Recommend continuing medical therapy and treatment of risk factors.    2.  Paroxysmal atrial fibrillation: He is in sinus rhythm.  He is currently on Eliquis for anticoagulation.  3.  Aortic stenosis: Mild to moderate by most recent echo.   4. COPD on home oxygen at night, stable symptoms overall.  5. Hyperlipidemia: Continue treatment with atorvastatin.  Most recent lipid profile showed improvement in LDL which was down to 82.   Disposition:   FU with me as needed if he develops symptoms related to peripheral arterial disease.  Continue to follow-up with Dr. Acie Fredrickson.  Signed,  Kathlyn Sacramento, MD  05/04/2021 11:08 AM    [place]

## 2021-08-27 ENCOUNTER — Other Ambulatory Visit: Payer: Self-pay | Admitting: *Deleted

## 2021-08-27 ENCOUNTER — Telehealth: Payer: Self-pay | Admitting: Cardiovascular Disease

## 2021-08-27 DIAGNOSIS — I739 Peripheral vascular disease, unspecified: Secondary | ICD-10-CM

## 2021-08-27 DIAGNOSIS — E785 Hyperlipidemia, unspecified: Secondary | ICD-10-CM

## 2021-08-27 MED ORDER — ATORVASTATIN CALCIUM 40 MG PO TABS: 40.0000 mg | ORAL_TABLET | Freq: Every day | ORAL | 3 refills | Status: DC

## 2021-08-27 NOTE — Telephone Encounter (Signed)
Refills has been sent to the pharmacy. 

## 2021-08-27 NOTE — Telephone Encounter (Signed)
*  STAT* If patient is at the pharmacy, call can be transferred to refill team.   1. Which medications need to be refilled? (please list name of each medication and dose if known) Atorvastatin  2. Which pharmacy/location (including street and city if local pharmacy) is medication to be sent to? [facility]  3. Do they need a 30 day or 90 day supply? 90 days and refills- please call in today, please does not have any left

## 2021-11-16 ENCOUNTER — Encounter: Payer: Self-pay | Admitting: Cardiovascular Disease

## 2022-07-20 ENCOUNTER — Emergency Department (HOSPITAL_COMMUNITY): Payer: Medicare (Managed Care)

## 2022-07-20 ENCOUNTER — Inpatient Hospital Stay (HOSPITAL_COMMUNITY)
Admission: EM | Admit: 2022-07-20 | Discharge: 2022-07-26 | DRG: 183 | Disposition: A | Discharge status: Home / Self Care | Payer: Medicare (Managed Care) | Attending: Internal Medicine | Admitting: Internal Medicine

## 2022-07-20 ENCOUNTER — Encounter (HOSPITAL_COMMUNITY): Payer: Self-pay

## 2022-07-20 ENCOUNTER — Other Ambulatory Visit: Payer: Self-pay

## 2022-07-20 DIAGNOSIS — Z9049 Acquired absence of other specified parts of digestive tract: Secondary | ICD-10-CM

## 2022-07-20 DIAGNOSIS — R5381 Other malaise: Secondary | ICD-10-CM | POA: Diagnosis present

## 2022-07-20 DIAGNOSIS — G4700 Insomnia, unspecified: Secondary | ICD-10-CM | POA: Diagnosis present

## 2022-07-20 DIAGNOSIS — G4733 Obstructive sleep apnea (adult) (pediatric): Secondary | ICD-10-CM | POA: Diagnosis present

## 2022-07-20 DIAGNOSIS — I480 Paroxysmal atrial fibrillation: Secondary | ICD-10-CM

## 2022-07-20 DIAGNOSIS — E878 Other disorders of electrolyte and fluid balance, not elsewhere classified: Secondary | ICD-10-CM | POA: Diagnosis present

## 2022-07-20 DIAGNOSIS — I10 Essential (primary) hypertension: Secondary | ICD-10-CM | POA: Diagnosis present

## 2022-07-20 DIAGNOSIS — I739 Peripheral vascular disease, unspecified: Secondary | ICD-10-CM | POA: Diagnosis present

## 2022-07-20 DIAGNOSIS — Z87891 Personal history of nicotine dependence: Secondary | ICD-10-CM

## 2022-07-20 DIAGNOSIS — S2249XA Multiple fractures of ribs, unspecified side, initial encounter for closed fracture: Secondary | ICD-10-CM | POA: Diagnosis present

## 2022-07-20 DIAGNOSIS — Z86711 Personal history of pulmonary embolism: Secondary | ICD-10-CM

## 2022-07-20 DIAGNOSIS — I350 Nonrheumatic aortic (valve) stenosis: Secondary | ICD-10-CM | POA: Diagnosis present

## 2022-07-20 DIAGNOSIS — Z9981 Dependence on supplemental oxygen: Secondary | ICD-10-CM

## 2022-07-20 DIAGNOSIS — E785 Hyperlipidemia, unspecified: Secondary | ICD-10-CM

## 2022-07-20 DIAGNOSIS — K219 Gastro-esophageal reflux disease without esophagitis: Secondary | ICD-10-CM | POA: Diagnosis present

## 2022-07-20 DIAGNOSIS — N179 Acute kidney failure, unspecified: Secondary | ICD-10-CM | POA: Diagnosis not present

## 2022-07-20 DIAGNOSIS — J9621 Acute and chronic respiratory failure with hypoxia: Secondary | ICD-10-CM | POA: Diagnosis present

## 2022-07-20 DIAGNOSIS — W1830XA Fall on same level, unspecified, initial encounter: Secondary | ICD-10-CM | POA: Diagnosis present

## 2022-07-20 DIAGNOSIS — F419 Anxiety disorder, unspecified: Secondary | ICD-10-CM | POA: Diagnosis present

## 2022-07-20 DIAGNOSIS — G8929 Other chronic pain: Secondary | ICD-10-CM | POA: Diagnosis present

## 2022-07-20 DIAGNOSIS — S2241XA Multiple fractures of ribs, right side, initial encounter for closed fracture: Secondary | ICD-10-CM | POA: Diagnosis not present

## 2022-07-20 DIAGNOSIS — Z79899 Other long term (current) drug therapy: Secondary | ICD-10-CM

## 2022-07-20 DIAGNOSIS — N400 Enlarged prostate without lower urinary tract symptoms: Secondary | ICD-10-CM

## 2022-07-20 DIAGNOSIS — Z7951 Long term (current) use of inhaled steroids: Secondary | ICD-10-CM

## 2022-07-20 DIAGNOSIS — E871 Hypo-osmolality and hyponatremia: Secondary | ICD-10-CM

## 2022-07-20 DIAGNOSIS — Z7901 Long term (current) use of anticoagulants: Secondary | ICD-10-CM

## 2022-07-20 DIAGNOSIS — F03A0 Unspecified dementia, mild, without behavioral disturbance, psychotic disturbance, mood disturbance, and anxiety: Secondary | ICD-10-CM | POA: Diagnosis present

## 2022-07-20 DIAGNOSIS — J441 Chronic obstructive pulmonary disease with (acute) exacerbation: Secondary | ICD-10-CM | POA: Insufficient documentation

## 2022-07-20 LAB — TROPONIN I (HIGH SENSITIVITY): Troponin I (High Sensitivity): 17 ng/L (ref <18)

## 2022-07-20 LAB — CBC WITH DIFFERENTIAL/PLATELET
Abs Immature Granulocytes: 0.04 10*3/uL (ref 0.00–0.07)
Basophils Absolute: 0.1 10*3/uL (ref 0.0–0.1)
Basophils Relative: 1 %
Eosinophils Absolute: 0.3 10*3/uL (ref 0.0–0.5)
Eosinophils Relative: 3 %
HCT: 35 % — ABNORMAL LOW (ref 39.0–52.0)
Hemoglobin: 11.7 g/dL — ABNORMAL LOW (ref 13.0–17.0)
Immature Granulocytes: 0 %
Lymphocytes Relative: 15 %
Lymphs Abs: 1.4 10*3/uL (ref 0.7–4.0)
MCH: 32.1 pg (ref 26.0–34.0)
MCHC: 33.4 g/dL (ref 30.0–36.0)
MCV: 96.2 fL (ref 80.0–100.0)
Monocytes Absolute: 1 10*3/uL (ref 0.1–1.0)
Monocytes Relative: 11 %
Neutro Abs: 6.5 10*3/uL (ref 1.7–7.7)
Neutrophils Relative %: 70 %
Platelets: 284 10*3/uL (ref 150–400)
RBC: 3.64 MIL/uL — ABNORMAL LOW (ref 4.22–5.81)
RDW: 14.9 % (ref 11.5–15.5)
WBC: 9.3 10*3/uL (ref 4.0–10.5)
nRBC: 0 % (ref 0.0–0.2)

## 2022-07-20 LAB — COMPREHENSIVE METABOLIC PANEL
ALT: 17 U/L (ref 0–44)
AST: 25 U/L (ref 15–41)
Albumin: 3.4 g/dL — ABNORMAL LOW (ref 3.5–5.0)
Alkaline Phosphatase: 65 U/L (ref 38–126)
Anion gap: 11 (ref 5–15)
BUN: 17 mg/dL (ref 8–23)
CO2: 26 mmol/L (ref 22–32)
Calcium: 9.1 mg/dL (ref 8.9–10.3)
Chloride: 92 mmol/L — ABNORMAL LOW (ref 98–111)
Creatinine, Ser: 1.12 mg/dL (ref 0.61–1.24)
GFR, Estimated: >60 mL/min (ref >60)
Glucose, Bld: 93 mg/dL (ref 70–99)
Potassium: 4.3 mmol/L (ref 3.5–5.1)
Sodium: 129 mmol/L — ABNORMAL LOW (ref 135–145)
Total Bilirubin: 0.7 mg/dL (ref 0.3–1.2)
Total Protein: 7 g/dL (ref 6.5–8.1)

## 2022-07-20 LAB — MAGNESIUM: Magnesium: 2.1 mg/dL (ref 1.7–2.4)

## 2022-07-20 LAB — LIPASE, BLOOD: Lipase: 32 U/L (ref 11–51)

## 2022-07-20 MED ORDER — IOHEXOL 350 MG/ML SOLN
75.0000 mL | Freq: Once | INTRAVENOUS | Status: AC | PRN
  Administered 2022-07-20: 75 mL via INTRAVENOUS

## 2022-07-20 MED ORDER — SODIUM CHLORIDE 0.9 % IV SOLN
1.0000 g | Freq: Once | INTRAVENOUS | Status: AC
  Administered 2022-07-20: 1 g via INTRAVENOUS
  Filled 2022-07-20: qty 10

## 2022-07-20 MED ORDER — ALBUTEROL SULFATE (2.5 MG/3ML) 0.083% IN NEBU
10.0000 mg/h | INHALATION_SOLUTION | RESPIRATORY_TRACT | Status: AC
  Administered 2022-07-20: 10 mg/h via RESPIRATORY_TRACT
  Filled 2022-07-20: qty 12

## 2022-07-20 MED ORDER — IPRATROPIUM-ALBUTEROL 0.5-2.5 (3) MG/3ML IN SOLN
3.0000 mL | Freq: Once | RESPIRATORY_TRACT | Status: AC
  Administered 2022-07-20: 3 mL via RESPIRATORY_TRACT
  Filled 2022-07-20: qty 3

## 2022-07-20 MED ORDER — LACTATED RINGERS IV BOLUS
500.0000 mL | Freq: Once | INTRAVENOUS | Status: AC
  Administered 2022-07-20: 500 mL via INTRAVENOUS

## 2022-07-20 MED ORDER — KETOROLAC TROMETHAMINE 15 MG/ML IJ SOLN
15.0000 mg | Freq: Once | INTRAMUSCULAR | Status: AC
  Administered 2022-07-20: 15 mg via INTRAVENOUS
  Filled 2022-07-20: qty 1

## 2022-07-20 MED ORDER — METHYLPREDNISOLONE SODIUM SUCC 125 MG IJ SOLR
125.0000 mg | Freq: Once | INTRAMUSCULAR | Status: AC
  Administered 2022-07-20: 125 mg via INTRAVENOUS
  Filled 2022-07-20: qty 2

## 2022-07-20 MED ORDER — LIDOCAINE 5 % EX PTCH
1.0000 | MEDICATED_PATCH | CUTANEOUS | Status: DC
  Administered 2022-07-20 – 2022-07-25 (×6): 1 via TRANSDERMAL
  Filled 2022-07-20 (×7): qty 1

## 2022-07-20 MED ORDER — METHOCARBAMOL 500 MG PO TABS
750.0000 mg | ORAL_TABLET | Freq: Once | ORAL | Status: AC
  Administered 2022-07-20: 750 mg via ORAL
  Filled 2022-07-20: qty 2

## 2022-07-20 MED ORDER — OXYCODONE-ACETAMINOPHEN 5-325 MG PO TABS
1.0000 | ORAL_TABLET | Freq: Once | ORAL | Status: AC
  Administered 2022-07-20: 1 via ORAL
  Filled 2022-07-20: qty 1

## 2022-07-20 MED ORDER — DOXYCYCLINE HYCLATE 100 MG PO TABS
100.0000 mg | ORAL_TABLET | Freq: Once | ORAL | Status: AC
  Administered 2022-07-20: 100 mg via ORAL
  Filled 2022-07-20: qty 1

## 2022-07-20 NOTE — ED Triage Notes (Addendum)
Pt reports this past Friday, he accidentally pushed a shopping car off a curb, causing him to fall onto the cart. PT c/o pain to the right side of his chest and right armpit. PT states since then he has been having a hard time breathing due to the pain and has a productive cough. Pt is currently on baseline 3LNC. PT is AxOx4. Pt states he is on Eliquis. Denies hitting his head.

## 2022-07-20 NOTE — ED Provider Notes (Signed)
[place] EMERGENCY DEPARTMENT AT [facility] Provider Note   CSN: [id number] Arrival date & time: 07/20/22  1626     History {Add pertinent medical, surgical, social history, OB history to HPI:1} Chief Complaint  Patient presents with   Fall   Shortness of Breath    Warren [name] is a 72 y.o. male.   Fall Associated symptoms include chest pain and shortness of breath.  Shortness of Breath Associated symptoms: chest pain and cough   Patient presents for shortness of breath and chest wall pain.  Medical history includes COPD, GERD, pancreatitis, PE, atrial fibrillation, anxiety, anemia, PAD.  He is on 3 L of supplemental oxygen at baseline.  5 days ago, patient was pushing a shopping cart.  He states that the shopping cart turned on its side causing him to fall forward and struck what he believes was the handle of the shopping cart in the right axillary area.  Since that time, he has had lateral right chest wall pain.  Pain is worsened with inspiration and coughing.  He has been taking ibuprofen at home with minimal relief.  He has developed worsening shortness of breath since this injury.  He has had a worsened cough that is productive of yellow sputum.     Home Medications Prior to Admission medications   Medication Sig Start Date End Date Taking? Authorizing Provider  albuterol (VENTOLIN HFA) 108 (90 Base) MCG/ACT inhaler Inhale 2 puffs into the lungs every 4 (four) hours as needed for wheezing or shortness of breath. 06/03/20   [provider]  apixaban (ELIQUIS) 5 MG TABS tablet Take 1 tablet (5 mg total) by mouth 2 (two) times daily. 06/25/20   Pokhrel, Rebekah Chesterfield, MD  atorvastatin (LIPITOR) 40 MG tablet Take 1 tablet (40 mg total) by mouth daily. 08/27/21 08/22/22  Iran Ouch, MD  cyanocobalamin 1000 MCG tablet Take 1,000 mcg by mouth daily.    [provider]  DALIRESP 250 MCG TABS Take 250 mcg by mouth daily. 06/17/20   [provider]   donepezil (ARICEPT) 10 MG tablet Take 10 mg by mouth daily. 08/03/20   [provider]  fluticasone (FLONASE) 50 MCG/ACT nasal spray Place 1 spray into both nostrils daily as needed for allergies or rhinitis. 04/08/20   [provider]  Fluticasone-Umeclidin-Vilant 100-62.5-25 MCG/INH AEPB Inhale 1 puff into the lungs daily.    [provider]  gabapentin (NEURONTIN) 600 MG tablet Take 600 mg by mouth 2 (two) times daily. 07/06/20   [provider]  Iron-Folic Acid-Vit B12 (IRON FORMULA PO) Take 81 mg by mouth 2 (two) times daily.    [provider]  nystatin cream (MYCOSTATIN) Apply 1 application topically 2 (two) times daily. 02/18/21   [provider]  omeprazole (PRILOSEC) 40 MG capsule Take 1 capsule (40 mg total) by mouth daily. 11/21/16   Armbruster, Willaim Rayas, MD  ondansetron (ZOFRAN ODT) 4 MG disintegrating tablet Take 1 tablet (4 mg total) by mouth every 8 (eight) hours as needed for nausea or vomiting. 08/09/20   Zannie Cove, MD  ondansetron (ZOFRAN) 4 MG tablet Take 1 tablet (4 mg total) by mouth every 6 (six) hours as needed for nausea. 06/25/20   Pokhrel, Rebekah Chesterfield, MD  Oxycodone HCl 10 MG TABS Take 10 mg by mouth 4 (four) times daily as needed (pain). 07/20/20   [provider]  predniSONE (DELTASONE) 20 MG tablet Take 1-2 tablets (20-40 mg total) by mouth daily with breakfast. Take  40mg  daily for 2days then 20mg  daily for 2days Patient not taking: Reported on 05/04/2021 08/10/20   Zannie Cove, MD  promethazine (PHENERGAN) 25 MG tablet Take 25 mg by mouth every 8 (eight) hours as needed. 04/27/21   [provider]  RESTASIS 0.05 % ophthalmic emulsion  03/01/21   [provider]  tamsulosin (FLOMAX) 0.4 MG CAPS capsule Take 0.4 mg by mouth in the morning and at bedtime. 03/18/16   [provider]  traZODone (DESYREL) 50 MG tablet Take 50 mg by mouth at bedtime. 08/19/20   [provider]   triamcinolone ointment (KENALOG) 0.1 % SMARTSIG:2 Topical Twice Daily 02/18/21   [provider]  VITAMIN D, ERGOCALCIFEROL, PO Take 5,000 Units by mouth daily.    [provider]      Allergies    Patient has no known allergies.    Review of Systems   Review of Systems  Respiratory:  Positive for cough and shortness of breath.   Cardiovascular:  Positive for chest pain.  All other systems reviewed and are negative.   Physical Exam Updated Vital Signs BP (!) 180/94 (BP Location: Left Arm)   Pulse 73   Temp 97.7 F (36.5 C) (Oral)   Resp 20   SpO2 95%  Physical Exam Vitals and nursing note reviewed.  Constitutional:      General: He is not in acute distress.    Appearance: He is well-developed. He is not toxic-appearing or diaphoretic.  HENT:     Head: Normocephalic and atraumatic.     Mouth/Throat:     Mouth: Mucous membranes are moist.  Eyes:     Conjunctiva/sclera: Conjunctivae normal.  Cardiovascular:     Rate and Rhythm: Normal rate and regular rhythm.     Heart sounds: No murmur heard. Pulmonary:     Effort: Pulmonary effort is normal. Tachypnea present. No respiratory distress.     Breath sounds: Wheezing present.  Chest:     Chest wall: Tenderness present.  Abdominal:     Palpations: Abdomen is soft.     Tenderness: There is no abdominal tenderness.  Musculoskeletal:        General: No swelling. Normal range of motion.     Cervical back: Normal range of motion and neck supple.     Right lower leg: No edema.     Left lower leg: No edema.  Skin:    General: Skin is warm and dry.     Coloration: Skin is not cyanotic or pale.  Neurological:     General: No focal deficit present.     Mental Status: He is alert and oriented to person, place, and time.  Psychiatric:        Mood and Affect: Mood normal.        Behavior: Behavior normal.     ED Results / Procedures / Treatments   Labs (all labs ordered are listed, but only abnormal  results are displayed) Labs Reviewed  CBC WITH DIFFERENTIAL/PLATELET - Abnormal; Notable for the following components:      Result Value   RBC 3.64 (*)    Hemoglobin 11.7 (*)    HCT 35.0 (*)    All other components within normal limits  COMPREHENSIVE METABOLIC PANEL - Abnormal; Notable for the following components:   Sodium 129 (*)    Chloride 92 (*)    Albumin 3.4 (*)    All other components within normal limits    EKG None  Radiology DG Chest  2 View  Result Date: 07/20/2022 CLINICAL DATA:  Shortness of breath, RIGHT chest pain, recent fall EXAM: CHEST - 2 VIEW COMPARISON:  08/05/2020 FINDINGS: Normal heart size, mediastinal contours, and pulmonary vascularity. Atherosclerotic calcification aorta. Emphysematous and bronchitic changes consistent with COPD. Chronic interstitial prominence greater in RIGHT lung than LEFT. No definite acute infiltrate, pleural effusion, or pneumothorax. Old fracture deformities of lower lateral RIGHT ribs. No acute osseous findings. IMPRESSION: COPD changes with chronic interstitial prominence, slightly greater on RIGHT, minimally progressive since 2022. No definite acute abnormalities. Aortic Atherosclerosis (ICD10-I70.0) and Emphysema (ICD10-J43.9). Electronically Signed   By: Ulyses Southward M.D.   On: 07/20/2022 17:39    Procedures Procedures  {Document cardiac monitor, telemetry assessment procedure when appropriate:1}  Medications Ordered in ED Medications - No data to display  ED Course/ Medical Decision Making/ A&P   {   Click here for ABCD2, HEART and other calculatorsREFRESH Note before signing :1}                          Medical Decision Making Amount and/or Complexity of Data Reviewed Labs: ordered. Radiology: ordered.   This patient presents to the ED for concern of ***, this involves an extensive number of treatment options, and is a complaint that carries with it a high risk of complications and morbidity.  The differential diagnosis  includes ***   Co morbidities that complicate the patient evaluation  ***   Additional history obtained:  Additional history obtained from *** External records from outside source obtained and reviewed including ***   Lab Tests:  I Ordered, and personally interpreted labs.  The pertinent results include:  ***   Imaging Studies ordered:  I ordered imaging studies including ***  I independently visualized and interpreted imaging which showed *** I agree with the radiologist interpretation   Cardiac Monitoring: / EKG:  The patient was maintained on a cardiac monitor.  I personally viewed and interpreted the cardiac monitored which showed an underlying rhythm of: ***   Consultations Obtained:  I requested consultation with the ***,  and discussed lab and imaging findings as well as pertinent plan - they recommend: ***   Problem List / ED Course / Critical interventions / Medication management  Patient presents for chest wall pain and shortness of breath.  Chest wall pain follows a recent injury.  On arrival in the ED, patient is on his home 3 L of supplemental oxygen with normal SpO2.  He does have mildly increased work of breathing.  On auscultation, wheezing is present.  He states that he has had a worsening cough that is productive of yellow sputum.  Treatment for COPD exacerbation was initiated.  Prior to being bedded in the ED, initial workup showed anemia improved from baseline, no leukocytosis, mild hyponatremia and hypochloremia with otherwise normal electrolytes.  Chest x-ray showed no acute findings.  CTA of chest was ordered to further evaluate recent injury as well as possible pneumonia.***. I ordered medication including ***  for ***  Reevaluation of the patient after these medicines showed that the patient {resolved/improved/worsened:23923::"improved"} I have reviewed the patients home medicines and have made adjustments as needed   Social Determinants of  Health:  ***   Test / Admission - Considered:  ***   {Document critical care time when appropriate:1} {Document review of labs and clinical decision tools ie heart score, Chads2Vasc2 etc:1}  {Document your independent review of radiology images, and any outside records:1} {Document  your discussion with family members, caretakers, and with consultants:1} {Document social determinants of health affecting pt's care:1} {Document your decision making why or why not admission, treatments were needed:1} Final Clinical Impression(s) / ED Diagnoses Final diagnoses:  None    Rx / DC Orders ED Discharge Orders     None

## 2022-07-21 DIAGNOSIS — I480 Paroxysmal atrial fibrillation: Secondary | ICD-10-CM | POA: Diagnosis present

## 2022-07-21 DIAGNOSIS — E871 Hypo-osmolality and hyponatremia: Secondary | ICD-10-CM

## 2022-07-21 DIAGNOSIS — Z7901 Long term (current) use of anticoagulants: Secondary | ICD-10-CM | POA: Diagnosis not present

## 2022-07-21 DIAGNOSIS — E785 Hyperlipidemia, unspecified: Secondary | ICD-10-CM | POA: Diagnosis present

## 2022-07-21 DIAGNOSIS — N179 Acute kidney failure, unspecified: Secondary | ICD-10-CM | POA: Diagnosis not present

## 2022-07-21 DIAGNOSIS — Z87891 Personal history of nicotine dependence: Secondary | ICD-10-CM | POA: Diagnosis not present

## 2022-07-21 DIAGNOSIS — G4700 Insomnia, unspecified: Secondary | ICD-10-CM | POA: Diagnosis present

## 2022-07-21 DIAGNOSIS — K219 Gastro-esophageal reflux disease without esophagitis: Secondary | ICD-10-CM | POA: Diagnosis present

## 2022-07-21 DIAGNOSIS — S2241XA Multiple fractures of ribs, right side, initial encounter for closed fracture: Secondary | ICD-10-CM | POA: Diagnosis present

## 2022-07-21 DIAGNOSIS — Z86711 Personal history of pulmonary embolism: Secondary | ICD-10-CM | POA: Diagnosis not present

## 2022-07-21 DIAGNOSIS — Z9981 Dependence on supplemental oxygen: Secondary | ICD-10-CM | POA: Diagnosis not present

## 2022-07-21 DIAGNOSIS — G8929 Other chronic pain: Secondary | ICD-10-CM | POA: Diagnosis present

## 2022-07-21 DIAGNOSIS — I10 Essential (primary) hypertension: Secondary | ICD-10-CM | POA: Diagnosis present

## 2022-07-21 DIAGNOSIS — N400 Enlarged prostate without lower urinary tract symptoms: Secondary | ICD-10-CM

## 2022-07-21 DIAGNOSIS — I739 Peripheral vascular disease, unspecified: Secondary | ICD-10-CM | POA: Diagnosis present

## 2022-07-21 DIAGNOSIS — Z9049 Acquired absence of other specified parts of digestive tract: Secondary | ICD-10-CM | POA: Diagnosis not present

## 2022-07-21 DIAGNOSIS — S2249XA Multiple fractures of ribs, unspecified side, initial encounter for closed fracture: Secondary | ICD-10-CM | POA: Diagnosis present

## 2022-07-21 DIAGNOSIS — I350 Nonrheumatic aortic (valve) stenosis: Secondary | ICD-10-CM | POA: Diagnosis present

## 2022-07-21 DIAGNOSIS — J9621 Acute and chronic respiratory failure with hypoxia: Secondary | ICD-10-CM | POA: Diagnosis present

## 2022-07-21 DIAGNOSIS — G4733 Obstructive sleep apnea (adult) (pediatric): Secondary | ICD-10-CM | POA: Diagnosis present

## 2022-07-21 DIAGNOSIS — E878 Other disorders of electrolyte and fluid balance, not elsewhere classified: Secondary | ICD-10-CM | POA: Diagnosis present

## 2022-07-21 DIAGNOSIS — J441 Chronic obstructive pulmonary disease with (acute) exacerbation: Secondary | ICD-10-CM | POA: Diagnosis present

## 2022-07-21 DIAGNOSIS — F419 Anxiety disorder, unspecified: Secondary | ICD-10-CM | POA: Diagnosis present

## 2022-07-21 DIAGNOSIS — R5381 Other malaise: Secondary | ICD-10-CM | POA: Diagnosis present

## 2022-07-21 DIAGNOSIS — F03A0 Unspecified dementia, mild, without behavioral disturbance, psychotic disturbance, mood disturbance, and anxiety: Secondary | ICD-10-CM | POA: Diagnosis present

## 2022-07-21 DIAGNOSIS — W1830XA Fall on same level, unspecified, initial encounter: Secondary | ICD-10-CM | POA: Diagnosis present

## 2022-07-21 LAB — BASIC METABOLIC PANEL
Anion gap: 9 (ref 5–15)
BUN: 18 mg/dL (ref 8–23)
CO2: 25 mmol/L (ref 22–32)
Calcium: 8.8 mg/dL — ABNORMAL LOW (ref 8.9–10.3)
Chloride: 93 mmol/L — ABNORMAL LOW (ref 98–111)
Creatinine, Ser: 1.03 mg/dL (ref 0.61–1.24)
GFR, Estimated: >60 mL/min (ref >60)
Glucose, Bld: 238 mg/dL — ABNORMAL HIGH (ref 70–99)
Potassium: 4.4 mmol/L (ref 3.5–5.1)
Sodium: 127 mmol/L — ABNORMAL LOW (ref 135–145)

## 2022-07-21 LAB — TROPONIN I (HIGH SENSITIVITY): Troponin I (High Sensitivity): 14 ng/L (ref <18)

## 2022-07-21 LAB — GLUCOSE, CAPILLARY
Glucose-Capillary: 231 mg/dL — ABNORMAL HIGH (ref 70–99)
Glucose-Capillary: 178 mg/dL — ABNORMAL HIGH (ref 70–99)

## 2022-07-21 LAB — NA AND K (SODIUM & POTASSIUM), RAND UR
Potassium Urine: 33 mmol/L
Sodium, Ur: <10 mmol/L

## 2022-07-21 LAB — OSMOLALITY: Osmolality: 279 mOsm/kg (ref 275–295)

## 2022-07-21 MED ORDER — PROMETHAZINE HCL 25 MG PO TABS: 25.0000 mg | ORAL_TABLET | Freq: Three times a day (TID) | ORAL | Status: DC | PRN

## 2022-07-21 MED ORDER — POLYVINYL ALCOHOL 1.4 % OP SOLN
1.0000 [drp] | OPHTHALMIC | Status: DC | PRN
  Administered 2022-07-21: 1 [drp] via OPHTHALMIC
  Filled 2022-07-21: qty 15

## 2022-07-21 MED ORDER — INSULIN ASPART 100 UNIT/ML IJ SOLN: 0.0000 [IU] | Freq: Every day | INTRAMUSCULAR | Status: DC

## 2022-07-21 MED ORDER — REVEFENACIN 175 MCG/3ML IN SOLN
175.0000 ug | Freq: Every day | RESPIRATORY_TRACT | Status: DC
  Administered 2022-07-21 – 2022-07-26 (×6): 175 ug via RESPIRATORY_TRACT
  Filled 2022-07-21 (×6): qty 3

## 2022-07-21 MED ORDER — METHYLPREDNISOLONE SODIUM SUCC 125 MG IJ SOLR
80.0000 mg | Freq: Every day | INTRAMUSCULAR | Status: DC
  Administered 2022-07-21 – 2022-07-23 (×3): 80 mg via INTRAVENOUS
  Filled 2022-07-21 (×3): qty 2

## 2022-07-21 MED ORDER — CYCLOSPORINE 0.05 % OP EMUL
1.0000 [drp] | Freq: Two times a day (BID) | OPHTHALMIC | Status: DC
  Administered 2022-07-21 – 2022-07-26 (×11): 1 [drp] via OPHTHALMIC
  Filled 2022-07-21 (×11): qty 30

## 2022-07-21 MED ORDER — PANTOPRAZOLE SODIUM 40 MG PO TBEC
40.0000 mg | DELAYED_RELEASE_TABLET | Freq: Every day | ORAL | Status: DC
  Administered 2022-07-21 – 2022-07-26 (×6): 40 mg via ORAL
  Filled 2022-07-21 (×6): qty 1

## 2022-07-21 MED ORDER — VITAMIN B-12 1000 MCG PO TABS
1000.0000 ug | ORAL_TABLET | Freq: Every day | ORAL | Status: DC
  Administered 2022-07-21 – 2022-07-26 (×6): 1000 ug via ORAL
  Filled 2022-07-21 (×6): qty 1

## 2022-07-21 MED ORDER — GABAPENTIN 300 MG PO CAPS
300.0000 mg | ORAL_CAPSULE | Freq: Three times a day (TID) | ORAL | Status: DC
  Administered 2022-07-21 – 2022-07-26 (×16): 300 mg via ORAL
  Filled 2022-07-21 (×16): qty 1

## 2022-07-21 MED ORDER — ARFORMOTEROL TARTRATE 15 MCG/2ML IN NEBU
15.0000 ug | INHALATION_SOLUTION | Freq: Two times a day (BID) | RESPIRATORY_TRACT | Status: DC
  Administered 2022-07-21 – 2022-07-26 (×11): 15 ug via RESPIRATORY_TRACT
  Filled 2022-07-21 (×11): qty 2

## 2022-07-21 MED ORDER — DUTASTERIDE 0.5 MG PO CAPS
0.5000 mg | ORAL_CAPSULE | Freq: Every day | ORAL | Status: DC
  Administered 2022-07-21 – 2022-07-26 (×6): 0.5 mg via ORAL
  Filled 2022-07-21 (×7): qty 1

## 2022-07-21 MED ORDER — IPRATROPIUM-ALBUTEROL 0.5-2.5 (3) MG/3ML IN SOLN
3.0000 mL | Freq: Two times a day (BID) | RESPIRATORY_TRACT | Status: DC
  Administered 2022-07-21 – 2022-07-26 (×10): 3 mL via RESPIRATORY_TRACT
  Filled 2022-07-21 (×10): qty 3

## 2022-07-21 MED ORDER — ATORVASTATIN CALCIUM 40 MG PO TABS
40.0000 mg | ORAL_TABLET | Freq: Every day | ORAL | Status: DC
  Administered 2022-07-21 – 2022-07-26 (×6): 40 mg via ORAL
  Filled 2022-07-21 (×6): qty 1

## 2022-07-21 MED ORDER — ACETAMINOPHEN 500 MG PO TABS
1000.0000 mg | ORAL_TABLET | Freq: Three times a day (TID) | ORAL | Status: DC
  Administered 2022-07-21 – 2022-07-26 (×15): 1000 mg via ORAL
  Filled 2022-07-21 (×15): qty 2

## 2022-07-21 MED ORDER — DOXYCYCLINE HYCLATE 100 MG PO TABS
100.0000 mg | ORAL_TABLET | Freq: Two times a day (BID) | ORAL | Status: AC
  Administered 2022-07-21 – 2022-07-25 (×10): 100 mg via ORAL
  Filled 2022-07-21 (×10): qty 1

## 2022-07-21 MED ORDER — KETOROLAC TROMETHAMINE 15 MG/ML IJ SOLN
15.0000 mg | Freq: Three times a day (TID) | INTRAMUSCULAR | Status: AC | PRN
  Administered 2022-07-21 – 2022-07-22 (×2): 15 mg via INTRAVENOUS
  Filled 2022-07-21 (×2): qty 1

## 2022-07-21 MED ORDER — INSULIN ASPART 100 UNIT/ML IJ SOLN
0.0000 [IU] | Freq: Three times a day (TID) | INTRAMUSCULAR | Status: DC
  Administered 2022-07-21: 3 [IU] via SUBCUTANEOUS
  Administered 2022-07-22: 1 [IU] via SUBCUTANEOUS
  Administered 2022-07-22: 2 [IU] via SUBCUTANEOUS
  Administered 2022-07-22: 3 [IU] via SUBCUTANEOUS
  Administered 2022-07-23: 2 [IU] via SUBCUTANEOUS
  Administered 2022-07-23 – 2022-07-24 (×2): 1 [IU] via SUBCUTANEOUS
  Administered 2022-07-24: 3 [IU] via SUBCUTANEOUS
  Administered 2022-07-24: 2 [IU] via SUBCUTANEOUS
  Administered 2022-07-25: 3 [IU] via SUBCUTANEOUS
  Administered 2022-07-25: 1 [IU] via SUBCUTANEOUS

## 2022-07-21 MED ORDER — APIXABAN 5 MG PO TABS
5.0000 mg | ORAL_TABLET | Freq: Two times a day (BID) | ORAL | Status: DC
  Administered 2022-07-21 – 2022-07-26 (×11): 5 mg via ORAL
  Filled 2022-07-21 (×11): qty 1

## 2022-07-21 MED ORDER — BUDESONIDE 0.25 MG/2ML IN SUSP
0.2500 mg | Freq: Two times a day (BID) | RESPIRATORY_TRACT | Status: DC
  Administered 2022-07-21 – 2022-07-26 (×11): 0.25 mg via RESPIRATORY_TRACT
  Filled 2022-07-21 (×11): qty 2

## 2022-07-21 MED ORDER — ALBUTEROL SULFATE (2.5 MG/3ML) 0.083% IN NEBU: 2.5000 mg | INHALATION_SOLUTION | RESPIRATORY_TRACT | Status: DC | PRN

## 2022-07-21 MED ORDER — SODIUM CHLORIDE 0.9 % IV SOLN: INTRAVENOUS | Status: DC

## 2022-07-21 MED ORDER — OXYCODONE HCL 5 MG PO TABS
5.0000 mg | ORAL_TABLET | Freq: Four times a day (QID) | ORAL | Status: DC | PRN
  Administered 2022-07-21 – 2022-07-22 (×3): 5 mg via ORAL
  Filled 2022-07-21 (×4): qty 1

## 2022-07-21 MED ORDER — SODIUM CHLORIDE 1 G PO TABS
1.0000 g | ORAL_TABLET | Freq: Two times a day (BID) | ORAL | Status: DC
  Administered 2022-07-21 – 2022-07-22 (×2): 1 g via ORAL
  Filled 2022-07-21 (×2): qty 1

## 2022-07-21 MED ORDER — MIRTAZAPINE 15 MG PO TABS
30.0000 mg | ORAL_TABLET | Freq: Every day | ORAL | Status: DC
  Administered 2022-07-21 – 2022-07-25 (×5): 30 mg via ORAL
  Filled 2022-07-21 (×5): qty 2

## 2022-07-21 MED ORDER — MECLIZINE HCL 12.5 MG PO TABS: 12.5000 mg | ORAL_TABLET | Freq: Three times a day (TID) | ORAL | Status: DC | PRN

## 2022-07-21 MED ORDER — IPRATROPIUM-ALBUTEROL 0.5-2.5 (3) MG/3ML IN SOLN
3.0000 mL | Freq: Four times a day (QID) | RESPIRATORY_TRACT | Status: DC
  Administered 2022-07-21: 3 mL via RESPIRATORY_TRACT
  Filled 2022-07-21 (×2): qty 3

## 2022-07-21 MED ORDER — DONEPEZIL HCL 10 MG PO TABS
10.0000 mg | ORAL_TABLET | Freq: Every day | ORAL | Status: DC
  Administered 2022-07-21 – 2022-07-25 (×5): 10 mg via ORAL
  Filled 2022-07-21 (×5): qty 1

## 2022-07-21 MED ORDER — TAMSULOSIN HCL 0.4 MG PO CAPS
0.4000 mg | ORAL_CAPSULE | Freq: Every day | ORAL | Status: DC
  Administered 2022-07-21 – 2022-07-26 (×6): 0.4 mg via ORAL
  Filled 2022-07-21 (×6): qty 1

## 2022-07-21 NOTE — ED Notes (Signed)
Attempted to call floor to apprise them of pt coming up, no answer.
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
## 2022-07-21 NOTE — ED Notes (Signed)
ED TO INPATIENT HANDOFF REPORT  ED Nurse Name and Phone #: Victorino Dike [phone]  S Name/Age/Gender Warren [name] 72 y.o. male Room/Bed: 022C/022C  Code Status   Code Status: Full Code  Home/SNF/Other Home Patient oriented to: self, place, time, and situation Is this baseline? Yes   Triage Complete: Triage complete  Chief Complaint Rib fractures [S22.49XA]  Triage Note Pt reports this past Friday, he accidentally pushed a shopping car off a curb, causing him to fall onto the cart. PT c/o pain to the right side of his chest and right armpit. PT states since then he has been having a hard time breathing due to the pain and has a productive cough. Pt is currently on baseline 3LNC. PT is AxOx4. Pt states he is on Eliquis. Denies hitting his head.    Allergies No Known Allergies  Level of Care/Admitting Diagnosis ED Disposition     ED Disposition  Admit   Condition  --   Comment  Hospital Area: [facility] [[id number]]  Level of Care: Telemetry Medical [104]  May place patient in observation at [facility] or [facility] if equivalent level of care is available:: Yes  Covid Evaluation: Asymptomatic - no recent exposure (last 10 days) testing not required  Diagnosis: Rib fractures [147829]  Admitting Physician: John Giovanni [[id number]]  Attending Physician: John Giovanni [[id number]]          B Medical/Surgery History Past Medical History:  Diagnosis Date   Allergy    Anemia    Aortic stenosis    COPD (chronic obstructive pulmonary disease) (HCC)    Elevated troponin    GERD (gastroesophageal reflux disease)    History of pulmonary embolus (PE)    Hypertension    Insomnia    OSA (obstructive sleep apnea)    PAD (peripheral artery disease) (HCC)    by CT imaging   PAF (paroxysmal atrial fibrillation) (HCC)    Septic shock (HCC)    Superficial venous thrombosis of arm    Past Surgical History:  Procedure Laterality Date   CHOLECYSTECTOMY      ERCP N/A 06/20/2020   Procedure: ENDOSCOPIC RETROGRADE CHOLANGIOPANCREATOGRAPHY (ERCP);  Surgeon: Rachael Fee, MD;  Location: [facility];  Service: Endoscopy;  Laterality: N/A;   SPHINCTEROTOMY  06/20/2020   Procedure: SPHINCTEROTOMY;  Surgeon: Rachael Fee, MD;  Location: [facility];  Service: Endoscopy;;  Balloon Sweep-No Stones     A IV Location/Drains/Wounds Patient Lines/Drains/Airways Status     Active Line/Drains/Airways     Name Placement date Placement time Site Days   Peripheral IV 07/20/22 20 G 1" Left Antecubital 07/20/22  2049  Antecubital  1            Intake/Output Last 24 hours  Intake/Output Summary (Last 24 hours) at 07/21/2022 0816 Last data filed at 07/21/2022 0023 Gross per 24 hour  Intake 597.06 ml  Output --  Net 597.06 ml    Labs/Imaging Results for orders placed or performed during the hospital encounter of 07/20/22 (from the past 48 hour(s))  CBC with Differential     Status: Abnormal   Collection Time: 07/20/22  5:10 PM  Result Value Ref Range   WBC 9.3 4.0 - 10.5 K/uL   RBC 3.64 (L) 4.22 - 5.81 MIL/uL   Hemoglobin 11.7 (L) 13.0 - 17.0 g/dL   HCT 62.9 (L) 52.8 - 41.3 %   MCV 96.2 80.0 - 100.0 fL   MCH 32.1 26.0 - 34.0 pg   MCHC  33.4 30.0 - 36.0 g/dL   RDW 73.2 20.2 - 54.2 %   Platelets 284 150 - 400 K/uL   nRBC 0.0 0.0 - 0.2 %   Neutrophils Relative % 70 %   Neutro Abs 6.5 1.7 - 7.7 K/uL   Lymphocytes Relative 15 %   Lymphs Abs 1.4 0.7 - 4.0 K/uL   Monocytes Relative 11 %   Monocytes Absolute 1.0 0.1 - 1.0 K/uL   Eosinophils Relative 3 %   Eosinophils Absolute 0.3 0.0 - 0.5 K/uL   Basophils Relative 1 %   Basophils Absolute 0.1 0.0 - 0.1 K/uL   Immature Granulocytes 0 %   Abs Immature Granulocytes 0.04 0.00 - 0.07 K/uL    Comment: Performed at [facility], [address], [place], [place] [address]  Comprehensive metabolic panel     Status: Abnormal   Collection Time: 07/20/22  5:10 PM  Result Value Ref Range    Sodium 129 (L) 135 - 145 mmol/L   Potassium 4.3 3.5 - 5.1 mmol/L   Chloride 92 (L) 98 - 111 mmol/L   CO2 26 22 - 32 mmol/L   Glucose, Bld 93 70 - 99 mg/dL    Comment: Glucose reference range applies only to samples taken after fasting for at least 8 hours.   BUN 17 8 - 23 mg/dL   Creatinine, Ser 7.62 0.61 - 1.24 mg/dL   Calcium 9.1 8.9 - 83.1 mg/dL   Total Protein 7.0 6.5 - 8.1 g/dL   Albumin 3.4 (L) 3.5 - 5.0 g/dL   AST 25 15 - 41 U/L   ALT 17 0 - 44 U/L   Alkaline Phosphatase 65 38 - 126 U/L   Total Bilirubin 0.7 0.3 - 1.2 mg/dL   GFR, Estimated >51 >76 mL/min    Comment: (NOTE) Calculated using the CKD-EPI Creatinine Equation (2021)    Anion gap 11 5 - 15    Comment: Performed at [facility], [address] [place], [place] [address]  Magnesium     Status: None   Collection Time: 07/20/22  9:40 PM  Result Value Ref Range   Magnesium 2.1 1.7 - 2.4 mg/dL    Comment: Performed at [facility], [address], [place], [place] [address]  Lipase, blood     Status: None   Collection Time: 07/20/22  9:40 PM  Result Value Ref Range   Lipase 32 11 - 51 U/L    Comment: Performed at [facility], [address] [place], [place] [address]  Troponin I (High Sensitivity)     Status: None   Collection Time: 07/20/22  9:40 PM  Result Value Ref Range   Troponin I (High Sensitivity) 17 <18 ng/L    Comment: (NOTE) Elevated high sensitivity troponin I (hsTnI) values and significant  changes across serial measurements may suggest ACS but many other  chronic and acute conditions are known to elevate hsTnI results.  Refer to the "Links" section for chest pain algorithms and additional  guidance. Performed at [facility], [address], [place], [place] [address]   Troponin I (High Sensitivity)     Status: None   Collection Time: 07/20/22 11:18 PM  Result Value Ref Range   Troponin I (High Sensitivity) 14 <18 ng/L    Comment: (NOTE) Elevated high sensitivity  troponin I (hsTnI) values and significant  changes across serial measurements may suggest ACS but many other  chronic and acute conditions are  known to elevate hsTnI results.  Refer to the "Links" section for chest pain algorithms and additional  guidance. Performed at [facility], [address], [place], [place] [address]    CT Angio Chest PE W and/or Wo Contrast  Result Date: 07/20/2022 CLINICAL DATA:  Shortness of breath and recent fall with right-sided chest pain, initial encounter EXAM: CT ANGIOGRAPHY CHEST WITH CONTRAST TECHNIQUE: Multidetector CT imaging of the chest was performed using the standard protocol during bolus administration of intravenous contrast. Multiplanar CT image reconstructions and MIPs were obtained to evaluate the vascular anatomy. RADIATION DOSE REDUCTION: This exam was performed according to the departmental dose-optimization program which includes automated exposure control, adjustment of the mA and/or kV according to patient size and/or use of iterative reconstruction technique. CONTRAST:  75mL OMNIPAQUE IOHEXOL 350 MG/ML SOLN COMPARISON:  Chest x-ray from earlier in the same day. FINDINGS: Cardiovascular: Thoracic aorta demonstrates atherosclerotic calcifications without aneurysmal dilatation or dissection. No cardiac enlargement is seen. Coronary calcifications are noted. No definitive filling defect to suggest pulmonary emboli are seen. Mediastinum/Nodes: Thoracic inlet is within normal limits. No hilar or mediastinal adenopathy is noted. The esophagus is within normal limits with the exception of a sliding-type hiatal hernia. Lungs/Pleura: Lungs are well aerated bilaterally. Some inspissated material is noted within the lower lobe bronchial tree bilaterally. Diffuse emphysematous changes are noted. No sizable parenchymal nodule is seen. No effusion or pneumothorax is seen. Upper Abdomen: Gallbladder has been surgically removed. The remainder of the upper abdomen  is within normal limits. Musculoskeletal: Mildly displaced fractures of the right third and fourth ribs are noted anteriorly. No other fracture is seen. Review of the MIP images confirms the above findings. IMPRESSION: No evidence of pulmonary emboli. Right third and fourth rib fractures anteriorly with no complicating factors. Inspissated material within the lower lobe bronchial tree bilaterally. Hiatal hernia. Aortic Atherosclerosis (ICD10-I70.0) and Emphysema (ICD10-J43.9). Electronically Signed   By: Alcide Clever M.D.   On: 07/20/2022 23:16   DG Chest 2 View  Result Date: 07/20/2022 CLINICAL DATA:  Shortness of breath, RIGHT chest pain, recent fall EXAM: CHEST - 2 VIEW COMPARISON:  08/05/2020 FINDINGS: Normal heart size, mediastinal contours, and pulmonary vascularity. Atherosclerotic calcification aorta. Emphysematous and bronchitic changes consistent with COPD. Chronic interstitial prominence greater in RIGHT lung than LEFT. No definite acute infiltrate, pleural effusion, or pneumothorax. Old fracture deformities of lower lateral RIGHT ribs. No acute osseous findings. IMPRESSION: COPD changes with chronic interstitial prominence, slightly greater on RIGHT, minimally progressive since 2022. No definite acute abnormalities. Aortic Atherosclerosis (ICD10-I70.0) and Emphysema (ICD10-J43.9). Electronically Signed   By: Ulyses Southward M.D.   On: 07/20/2022 17:39    Pending Labs Unresulted Labs (From admission, onward)     Start     Ordered   07/22/22 0500  Basic metabolic panel  Daily,   R      07/21/22 0801   07/22/22 0500  CBC  Tomorrow morning,   R        07/21/22 0801   07/21/22 0802  Na and K (sodium & potassium), rand urine  Once,   R        07/21/22 0801   07/21/22 0625  Basic metabolic panel  Once,   R        07/21/22 0629   07/21/22 0625  Osmolality  Once,   R        07/21/22 0629            Vitals/Pain Today's Vitals  07/21/22 0310 07/21/22 0600 07/21/22 0615 07/21/22 0807  BP:   (!) 176/85 (!) 184/95 (!) 193/91  Pulse: 78 71 76 71  Resp: 19 19 20  (!) 21  Temp:    97.7 F (36.5 C)  TempSrc:    Oral  SpO2: 90% 93% 92% 94%  PainSc:        Isolation Precautions No active isolations  Medications Medications  albuterol (PROVENTIL) (2.5 MG/3ML) 0.083% nebulizer solution (0 mg/hr Nebulization Stopped 07/20/22 2313)  lidocaine (LIDODERM) 5 % 1 patch (1 patch Transdermal Patch Applied 07/20/22 2134)  doxycycline (VIBRA-TABS) tablet 100 mg (has no administration in time range)  ipratropium-albuterol (DUONEB) 0.5-2.5 (3) MG/3ML nebulizer solution 3 mL (has no administration in time range)  ketorolac (TORADOL) 15 MG/ML injection 15 mg (has no administration in time range)  methylPREDNISolone sodium succinate (SOLU-MEDROL) 125 mg/2 mL injection 80 mg (has no administration in time range)  acetaminophen (TYLENOL) tablet 1,000 mg (has no administration in time range)  albuterol (PROVENTIL) (2.5 MG/3ML) 0.083% nebulizer solution 2.5 mg (has no administration in time range)  oxyCODONE (Oxy IR/ROXICODONE) immediate release tablet 5 mg (has no administration in time range)  0.9 %  sodium chloride infusion (has no administration in time range)  atorvastatin (LIPITOR) tablet 40 mg (has no administration in time range)  donepezil (ARICEPT) tablet 10 mg (has no administration in time range)  mirtazapine (REMERON) tablet 30 mg (has no administration in time range)  pantoprazole (PROTONIX) EC tablet 40 mg (has no administration in time range)  dutasteride (AVODART) capsule 0.5 mg (has no administration in time range)  tamsulosin (FLOMAX) capsule 0.4 mg (has no administration in time range)  apixaban (ELIQUIS) tablet 5 mg (has no administration in time range)  gabapentin (NEURONTIN) capsule 300 mg (has no administration in time range)  polyvinyl alcohol (LIQUIFILM TEARS) 1.4 % ophthalmic solution 1 drop (has no administration in time range)  lactated ringers bolus 500 mL (0 mLs  Intravenous Stopped 07/21/22 0023)  ketorolac (TORADOL) 15 MG/ML injection 15 mg (15 mg Intravenous Given 07/20/22 2139)  methocarbamol (ROBAXIN) tablet 750 mg (750 mg Oral Given 07/20/22 2133)  oxyCODONE-acetaminophen (PERCOCET/ROXICET) 5-325 MG per tablet 1 tablet (1 tablet Oral Given 07/20/22 2133)  methylPREDNISolone sodium succinate (SOLU-MEDROL) 125 mg/2 mL injection 125 mg (125 mg Intravenous Given 07/20/22 2139)  ipratropium-albuterol (DUONEB) 0.5-2.5 (3) MG/3ML nebulizer solution 3 mL (3 mLs Nebulization Given 07/20/22 2200)  cefTRIAXone (ROCEPHIN) 1 g in sodium chloride 0.9 % 100 mL IVPB (0 g Intravenous Stopped 07/20/22 2313)  doxycycline (VIBRA-TABS) tablet 100 mg (100 mg Oral Given 07/20/22 2133)  iohexol (OMNIPAQUE) 350 MG/ML injection 75 mL (75 mLs Intravenous Contrast Given 07/20/22 2259)    Mobility walks     Focused Assessments Pulmonary Assessment Handoff:  Lung sounds: Bilateral Breath Sounds: Expiratory wheezes O2 Device: Nasal Cannula O2 Flow Rate (L/min): 4 L/min    R Recommendations: See Admitting Provider Note  Report given to:   Additional Notes:

## 2022-07-21 NOTE — Progress Notes (Signed)
Patient seen and examined personally, I reviewed the chart, history and physical and admission note, done by admitting physician this morning and agree with the same with following addendum.  Please refer to the morning admission note for more detailed plan of care.  Briefly,   72 year old male with complicated medical history including PVD PAF on Eliquis mild to moderate aortic stenosis, COPD, chronic hypoxic respiratory failure on 3 L [place], HLD, history of pancreatitis, history of PE in 2018, BPH, anxiety, OSA, chronic back pain, dementia, GERD presented to the ED with right-sided chest pain and shortness of breath.  Patient had a mechanical fall on Friday - 5 days PTA, in the [employer] when he fell on the cart.  Having right-sided chest pain since then.  Reports pulse ox in the 80s on 3 L nasal cannula at home.  In the ED, afebrile BP fairly stable needing 3 to nasal cannula and SpO2 92%.  Labs with sodium 129 normal LFTs troponin -17-14, fairly stable CBC Imaging with chest x-ray and CT angio chest: COPD changes with chronic interstitial prominence aortic atherosclerosis: CTA showed no PE but right third and fourth rib fractures anteriorly with no complicating factors, inspissated material within the lower lobe bronchial tree b/l. On exam he was noted to have wheezing on auscultation.  Patient subsequently admitted for acute COPD exacerbation, acute on chronic hypoxic respiratory failure and right third and fourth rib fracture.   Seen in the ED on the way to his room Requesting to resume all his home medication   Issues: Acute anterior right third and fourth rib fracture due to fall 5 days PTA: Continue pain management with scheduled Tylenol, lidocaine patch Toradol as needed and Oxy IR PRN.  He is on gabapentin.  Continue pulmonary toileting, antitussives and supplemental oxygen.  Acute COPD exacerbation: Patient wheezing bilaterally in the ED, no signs of infection on imaging, still having  diminished breath sounds bilaterally with expiratory wheezing, Continue on IV Solu-Medrol, add Yupelri/Brovana//Pulmicort nebulizer on home inhalers, continue DuoNeb as needed. Cont supplemental oxygen   Acute on chronic hypoxic respiratory failure: Multifactorial due to splinting/right rib fractures and COPD exacerbation.  Continue supplemental oxygen, continue to address underlying pain and COPD exacerbation as above.  Hyponatremia sodium at 129 monitor on IV fluids and follow-up urine electrolytes.  Repeat sodium at 127. ? SIADH- will hold ivf, add salt tab, add fluid restriction PAF in NSR on Eliquis HLD continue Lipitor BPH continue Flomax/Avodart GERD continue PPI Mild dementia -continue home Aricept

## 2022-07-21 NOTE — ED Notes (Signed)
Pt o2 sats 88% on 3L [place], o2 turned up by provider. Now on 4L [place].

## 2022-07-21 NOTE — ED Provider Notes (Signed)
I assumed care of this patient.  Please see previous provider's note for further details of history, exam, and MDM.   Briefly patient is a 72 y.o. male who presented SOB due to right chest wall pain s/p fall 5 days ago.  Noted to have 2 rib fractures on the right.  No evidence of pneumonia.  Patient does have increased oxygen requirement from his baseline 3 L nasal cannula.  Plan is to admit patient for pulmonary toilet and pain control for the rib fractures.      Nira Conn, MD 07/21/22 [phone]

## 2022-07-22 LAB — BASIC METABOLIC PANEL
Anion gap: 4 — ABNORMAL LOW (ref 5–15)
BUN: 22 mg/dL (ref 8–23)
CO2: 27 mmol/L (ref 22–32)
Calcium: 8.7 mg/dL — ABNORMAL LOW (ref 8.9–10.3)
Chloride: 94 mmol/L — ABNORMAL LOW (ref 98–111)
Creatinine, Ser: 1.32 mg/dL — ABNORMAL HIGH (ref 0.61–1.24)
GFR, Estimated: 57 mL/min — ABNORMAL LOW (ref >60)
Glucose, Bld: 140 mg/dL — ABNORMAL HIGH (ref 70–99)
Potassium: 4.6 mmol/L (ref 3.5–5.1)
Sodium: 125 mmol/L — ABNORMAL LOW (ref 135–145)

## 2022-07-22 LAB — HEMOGLOBIN A1C
Hgb A1c MFr Bld: 6 % — ABNORMAL HIGH (ref 4.8–5.6)
Mean Plasma Glucose: 125.5 mg/dL

## 2022-07-22 LAB — CBC
HCT: 29.9 % — ABNORMAL LOW (ref 39.0–52.0)
Hemoglobin: 9.9 g/dL — ABNORMAL LOW (ref 13.0–17.0)
MCH: 31.9 pg (ref 26.0–34.0)
MCHC: 33.1 g/dL (ref 30.0–36.0)
MCV: 96.5 fL (ref 80.0–100.0)
Platelets: 234 10*3/uL (ref 150–400)
RBC: 3.1 MIL/uL — ABNORMAL LOW (ref 4.22–5.81)
RDW: 15.2 % (ref 11.5–15.5)
WBC: 8.9 10*3/uL (ref 4.0–10.5)
nRBC: 0 % (ref 0.0–0.2)

## 2022-07-22 LAB — GLUCOSE, CAPILLARY
Glucose-Capillary: 135 mg/dL — ABNORMAL HIGH (ref 70–99)
Glucose-Capillary: 169 mg/dL — ABNORMAL HIGH (ref 70–99)
Glucose-Capillary: 234 mg/dL — ABNORMAL HIGH (ref 70–99)
Glucose-Capillary: 114 mg/dL — ABNORMAL HIGH (ref 70–99)

## 2022-07-22 MED ORDER — KETOROLAC TROMETHAMINE 15 MG/ML IJ SOLN
15.0000 mg | Freq: Four times a day (QID) | INTRAMUSCULAR | Status: AC | PRN
  Administered 2022-07-22 (×2): 15 mg via INTRAVENOUS
  Filled 2022-07-22 (×2): qty 1

## 2022-07-22 MED ORDER — OXYCODONE HCL 5 MG PO TABS
5.0000 mg | ORAL_TABLET | ORAL | Status: DC | PRN
  Administered 2022-07-23 – 2022-07-26 (×15): 5 mg via ORAL
  Filled 2022-07-22 (×15): qty 1

## 2022-07-22 MED ORDER — SODIUM CHLORIDE 0.9 % IV SOLN: INTRAVENOUS | Status: DC

## 2022-07-22 NOTE — Progress Notes (Addendum)
PROGRESS NOTE Warren [name]  ZOX:[id number] DOB: 1950-05-15 DOA: 07/20/2022 PCP: Erskine Emery, NP  Brief Narrative/Hospital Course:  72 year old male with complicated medical history including PVD PAF on Eliquis mild to moderate aortic stenosis, COPD, chronic hypoxic respiratory failure on 3 L [place], HLD, history of pancreatitis, history of PE in 2018, BPH, anxiety, OSA, chronic back pain, dementia, GERD presented to the ED with right-sided chest pain and shortness of breath.  Patient had a mechanical fall on Friday - 5 days PTA, in the [employer] when he fell on the cart.  Having right-sided chest pain since then.  Reports pulse ox in the 80s on 3 L nasal cannula at home.  In the ED, afebrile BP fairly stable needing 3 to nasal cannula and SpO2 92%.  Labs with sodium 129 normal LFTs troponin -17-14, fairly stable CBC Imaging with chest x-ray and CT angio chest: COPD changes with chronic interstitial prominence aortic atherosclerosis: CTA showed no PE but right third and fourth rib fractures anteriorly with no complicating factors, inspissated material within the lower lobe bronchial tree b/l. On exam he was noted to have wheezing on auscultation.  Patient subsequently admitted for acute COPD exacerbation, acute on chronic hypoxic respiratory failure and right third and fourth rib fracture.    Subjective: Seen and examined this morning he feels much better overall still has some chest pain on the right rib side, breathing some easier but it does hurt when he coughs.   Assessment and Plan: Principal Problem:   Rib fractures Active Problems:   COPD with acute exacerbation (HCC)   Hyponatremia   Paroxysmal A-fib (HCC)   Hyperlipidemia   BPH (benign prostatic hyperplasia)   Acute anterior right third and fourth rib fracture due to fall 5 days PTA:  Overall pain much better on current regimen continue with scheduled Tylenol, lidocaine patch Toradol as needed and Oxy IR PRN, gabapentin.  Continue  pulmonary toileting, antitussives and supplemental oxygen.  Complains of chest pain with coughing spells-add antitussives   Acute COPD exacerbation: Slowly improving continues to have wheezing and diminished breath sounds, continue with doxycycline, current IV Solu-Medrol, Yupelri/Brovana//Pulmicort neb, duoneb and supplemental oxygen    Acute on chronic hypoxic respiratory failure: Multifactorial due to splinting/right rib fractures and COPD exacerbation.  Continue supplemental oxygen, continue to address underlying pain and COPD exacerbation as above.   Hyponatremia sodium at 129> 125  urine sod came at <10 and usmol at 279 .  Will do fluid challenge given very low urine sodium, monitor hold off salt tab Recent Labs  Lab 07/20/22 1710 07/21/22 0755 07/22/22 0338  NA 129* 127* 125*   Mild AKI creatinine slightly bumped 1.3 IV fluids started monitor  PAF in NSR on Eliquis HLD continue Lipitor BPH continue Flomax/Avodart GERD continue PPI Mild dementia -continue home Aricep  DVT prophylaxis: Eliquis Code Status:   Code Status: Full Code Family Communication: plan of care discussed with patient at bedside. Patient status is: Inpatient because of COPD exacerbation Level of care: Telemetry Medical   Dispo: The patient is from: home alone            Anticipated disposition: TBD  Objective: Vitals last 24 hrs: Vitals:   07/22/22 0750 07/22/22 0753 07/22/22 0754 07/22/22 1339  BP:    (!) 175/87  Pulse:    81  Resp:    18  Temp:    98 F (36.7 C)  TempSrc:    Oral  SpO2: 94% 95% 97% 94%  Weight change:   Physical Examination: General exam: alert awake, older than stated age HEENT:Oral mucosa moist, Ear/Nose WNL grossly Respiratory system: bilaterally diminished breath sounds with expiratory wheezing BS, no use of accessory muscle Cardiovascular system: S1 & S2 +, No JVD. Gastrointestinal system: Abdomen soft,NT,ND, BS+ Nervous System:Alert, awake, moving  extremities. Extremities: LE edema NEG,distal peripheral pulses palpable.  Skin: No rashes,no icterus. MSK: Normal muscle bulk,tone, power  Medications reviewed:  Scheduled Meds:  acetaminophen  1,000 mg Oral Q8H   apixaban  5 mg Oral BID   arformoterol  15 mcg Nebulization BID   atorvastatin  40 mg Oral Daily   budesonide (PULMICORT) nebulizer solution  0.25 mg Nebulization BID   cyanocobalamin  1,000 mcg Oral Daily   cycloSPORINE  1 drop Left Eye BID   donepezil  10 mg Oral QHS   doxycycline  100 mg Oral Q12H   dutasteride  0.5 mg Oral Daily   gabapentin  300 mg Oral TID   insulin aspart  0-5 Units Subcutaneous QHS   insulin aspart  0-9 Units Subcutaneous TID WC   ipratropium-albuterol  3 mL Nebulization BID   lidocaine  1 patch Transdermal Q24H   methylPREDNISolone (SOLU-MEDROL) injection  80 mg Intravenous Daily   mirtazapine  30 mg Oral QHS   pantoprazole  40 mg Oral Daily   revefenacin  175 mcg Nebulization Daily   sodium chloride  1 g Oral BID WC   tamsulosin  0.4 mg Oral Daily  Continuous Infusions:  sodium chloride 75 mL/hr at 07/22/22 1152    Diet Order             Diet Heart Room service appropriate? Yes; Fluid consistency: Thin; Fluid restriction: 1200 mL Fluid  Diet effective now                  Intake/Output Summary (Last 24 hours) at 07/22/2022 1351 Last data filed at 07/22/2022 0900 Gross per 24 hour  Intake 240 ml  Output 1650 ml  Net -1410 ml   Net IO Since Admission: -572.94 mL [07/22/22 1351]  Wt Readings from Last 3 Encounters:  05/04/21 70.5 kg  09/29/20 82.3 kg  08/11/20 84.5 kg     Unresulted Labs (From admission, onward)     Start     Ordered   07/22/22 0500  Basic metabolic panel  Daily,   R      07/21/22 0801          Data Reviewed: I have personally reviewed following labs and imaging studies CBC: Recent Labs  Lab 07/20/22 1710 07/22/22 0338  WBC 9.3 8.9  NEUTROABS 6.5  --   HGB 11.7* 9.9*  HCT 35.0* 29.9*  MCV  96.2 96.5  PLT 284 234   Basic Metabolic Panel: Recent Labs  Lab 07/20/22 1710 07/20/22 2140 07/21/22 0755 07/22/22 0338  NA 129*  --  127* 125*  K 4.3  --  4.4 4.6  CL 92*  --  93* 94*  CO2 26  --  25 27  GLUCOSE 93  --  238* 140*  BUN 17  --  18 22  CREATININE 1.12  --  1.03 1.32*  CALCIUM 9.1  --  8.8* 8.7*  MG  --  2.1  --   --    GFR: CrCl cannot be calculated (Unknown ideal weight.). Liver Function Tests: Recent Labs  Lab 07/20/22 1710  AST 25  ALT 17  ALKPHOS 65  BILITOT 0.7  PROT 7.0  ALBUMIN 3.4*   Recent Labs  Lab 07/20/22 2140  LIPASE 32  HbA1C: Recent Labs    07/22/22 0338  HGBA1C 6.0*   CBG: Recent Labs  Lab 07/21/22 1634 07/21/22 2118 07/22/22 0730 07/22/22 1124  GLUCAP 231* 178* 135* 169*  No results found for this or any previous visit (from the past 240 hour(s)).  Antimicrobials: Anti-infectives (From admission, onward)    Start     Dose/Rate Route Frequency Ordered Stop   07/21/22 1000  doxycycline (VIBRA-TABS) tablet 100 mg        100 mg Oral Every 12 hours 07/21/22 0629     07/20/22 2100  cefTRIAXone (ROCEPHIN) 1 g in sodium chloride 0.9 % 100 mL IVPB        1 g 200 mL/hr over 30 Minutes Intravenous  Once 07/20/22 2049 07/20/22 2313   07/20/22 2100  doxycycline (VIBRA-TABS) tablet 100 mg        100 mg Oral  Once 07/20/22 2049 07/20/22 2133      Culture/Microbiology    Component Value Date/Time   SDES  08/06/2020 0240    BLOOD RIGHT ARM Performed at [facility], [address] [place], [place] [address]    SPECREQUEST  08/06/2020 0240    BOTTLES DRAWN AEROBIC AND ANAEROBIC Blood Culture adequate volume Performed at [facility], [address], [place], [place] [address]    CULT  08/06/2020 0240    NO GROWTH 5 DAYS Performed at [facility], [address], [place], [place] [address]    REPTSTATUS 08/11/2020 FINAL 08/06/2020 0240    Radiology Studies: CT Angio Chest PE W and/or Wo  Contrast  Result Date: 07/20/2022 CLINICAL DATA:  Shortness of breath and recent fall with right-sided chest pain, initial encounter EXAM: CT ANGIOGRAPHY CHEST WITH CONTRAST TECHNIQUE: Multidetector CT imaging of the chest was performed using the standard protocol during bolus administration of intravenous contrast. Multiplanar CT image reconstructions and MIPs were obtained to evaluate the vascular anatomy. RADIATION DOSE REDUCTION: This exam was performed according to the departmental dose-optimization program which includes automated exposure control, adjustment of the mA and/or kV according to patient size and/or use of iterative reconstruction technique. CONTRAST:  75mL OMNIPAQUE IOHEXOL 350 MG/ML SOLN COMPARISON:  Chest x-ray from earlier in the same day. FINDINGS: Cardiovascular: Thoracic aorta demonstrates atherosclerotic calcifications without aneurysmal dilatation or dissection. No cardiac enlargement is seen. Coronary calcifications are noted. No definitive filling defect to suggest pulmonary emboli are seen. Mediastinum/Nodes: Thoracic inlet is within normal limits. No hilar or mediastinal adenopathy is noted. The esophagus is within normal limits with the exception of a sliding-type hiatal hernia. Lungs/Pleura: Lungs are well aerated bilaterally. Some inspissated material is noted within the lower lobe bronchial tree bilaterally. Diffuse emphysematous changes are noted. No sizable parenchymal nodule is seen. No effusion or pneumothorax is seen. Upper Abdomen: Gallbladder has been surgically removed. The remainder of the upper abdomen is within normal limits. Musculoskeletal: Mildly displaced fractures of the right third and fourth ribs are noted anteriorly. No other fracture is seen. Review of the MIP images confirms the above findings. IMPRESSION: No evidence of pulmonary emboli. Right third and fourth rib fractures anteriorly with no complicating factors. Inspissated material within the lower lobe  bronchial tree bilaterally. Hiatal hernia. Aortic Atherosclerosis (ICD10-I70.0) and Emphysema (ICD10-J43.9). Electronically Signed   By: Alcide Clever M.D.   On: 07/20/2022 23:16   DG Chest 2 View  Result Date: 07/20/2022 CLINICAL DATA:  Shortness  of breath, RIGHT chest pain, recent fall EXAM: CHEST - 2 VIEW COMPARISON:  08/05/2020 FINDINGS: Normal heart size, mediastinal contours, and pulmonary vascularity. Atherosclerotic calcification aorta. Emphysematous and bronchitic changes consistent with COPD. Chronic interstitial prominence greater in RIGHT lung than LEFT. No definite acute infiltrate, pleural effusion, or pneumothorax. Old fracture deformities of lower lateral RIGHT ribs. No acute osseous findings. IMPRESSION: COPD changes with chronic interstitial prominence, slightly greater on RIGHT, minimally progressive since 2022. No definite acute abnormalities. Aortic Atherosclerosis (ICD10-I70.0) and Emphysema (ICD10-J43.9). Electronically Signed   By: Ulyses Southward M.D.   On: 07/20/2022 17:39     LOS: 1 day   Lanae Boast, MD [facility]  07/22/2022, 1:51 PM

## 2022-07-22 NOTE — TOC Initial Note (Addendum)
Transition of Care [facility]) - Initial/Assessment Note    Patient Details  Name: Warren [name] MRN: [id number] Date of Birth: 1950/09/01  [facility]) CM/SW Contact:    Epifanio Lesches, RN Phone Number: 07/22/2022, 10:24 AM  Clinical Narrative:                 Admitted s/p fall, suffered rib fxs, acute COPD exacerbation. From home alone. Supportive son, Gerilyn Pilgrim. PTA admit independent with ADL's. DME: cane, home oxygen/[employer]. Pt states not happy with PCP. Requesting new PCP, NCM to f/u. Pt without RX med concerns or transportation needs.  TOC team following for needs.... 07/22/2022 @ 10:40 am [facility]     [address], [place], [place] [address] Phone: [phone]     Next Steps: Follow up on 10/24/2022 Instructions: appointment scheduled for 10/24/2022 at 9:20 am with Dr.Sagardia to establish primary care     Pt made aware by NCM.  Expected Discharge Plan: Home/Self Care Barriers to Discharge: Continued Medical Work up   Patient Goals and CMS Choice            Expected Discharge Plan and Services       Living arrangements for the past 2 months: Single Family Home                                      Prior Living Arrangements/Services Living arrangements for the past 2 months: Single Family Home Lives with:: Self Patient language and need for interpreter reviewed:: Yes Do you feel safe going back to the place where you live?: Yes      Need for Family Participation in Patient Care: Yes (Comment) Care giver support system in place?: Yes (comment) (son) Current home services: DME (cane) Criminal Activity/Legal Involvement Pertinent to Current Situation/Hospitalization: No - Comment as needed  Activities of Daily Living Home Assistive Devices/Equipment: None ADL Screening (condition at time of admission) Patient's cognitive ability adequate to safely complete daily activities?: Yes Is the patient deaf  or have difficulty hearing?: No Does the patient have difficulty seeing, even when wearing glasses/contacts?: No Does the patient have difficulty concentrating, remembering, or making decisions?: No Patient able to express need for assistance with ADLs?: Yes Does the patient have difficulty dressing or bathing?: No Independently performs ADLs?: Yes (appropriate for developmental age) Does the patient have difficulty walking or climbing stairs?: No Weakness of Legs: None Weakness of Arms/Hands: None  Permission Sought/Granted   Permission granted to share information with : Yes, Verbal Permission Granted  Share Information with NAME: Jacob [name]  Son  [phone]           Emotional Assessment Appearance:: Appears stated age Attitude/Demeanor/Rapport: Engaged Affect (typically observed): Accepting Orientation: : Oriented to Self, Oriented to Place, Oriented to  Time, Oriented to Situation Alcohol / Substance Use: Not Applicable Psych Involvement: No (comment)  Admission diagnosis:  Rib fractures [S22.49XA] COPD exacerbation (HCC) [J44.1] Acute on chronic respiratory failure with hypoxia (HCC) [J96.21] Closed fracture of multiple ribs of right side, initial encounter [S22.41XA] Patient Active Problem List   Diagnosis Date Noted   Rib fractures 07/21/2022   Hyponatremia 07/21/2022   Paroxysmal A-fib (HCC) 07/21/2022   Hyperlipidemia 07/21/2022   BPH (benign prostatic hyperplasia) 07/21/2022   Syncope 08/06/2020   Hypokalemia 08/06/2020   Bronchopneumonia 08/05/2020   Atrial fibrillation with rapid ventricular response (HCC) 06/19/2020  Septic shock (HCC) 06/19/2020   Choledocholithiasis with obstruction 06/18/2020   GERD (gastroesophageal reflux disease) 06/18/2020   S/P cholecystectomy 06/18/2020   History of pancreatitis 06/18/2020   History of pulmonary embolism 06/18/2020   COPD with acute exacerbation (HCC) 06/18/2020   Cholangitis    ANXIETY 03/22/2007    TOBACCO ABUSE 03/22/2007   COPD 03/22/2007   GERD 03/22/2007   TRANSAMINASES, SERUM, ELEVATED 03/22/2007   PANCREATITIS, HX OF 03/22/2007   PCP:  Erskine Emery, NP Pharmacy:   [facility] - [place], [place] - [address] [place] [address] Phone: [phone] Fax: [phone]     Social Determinants of Health (SDOH) Social History: SDOH Screenings   Transportation Needs: No Transportation Needs (07/21/2022)  Tobacco Use: Medium Risk (07/20/2022)   SDOH Interventions:     Readmission Risk Interventions     No data to display

## 2022-07-23 LAB — BASIC METABOLIC PANEL
Anion gap: 10 (ref 5–15)
BUN: 27 mg/dL — ABNORMAL HIGH (ref 8–23)
CO2: 25 mmol/L (ref 22–32)
Calcium: 8.6 mg/dL — ABNORMAL LOW (ref 8.9–10.3)
Chloride: 96 mmol/L — ABNORMAL LOW (ref 98–111)
Creatinine, Ser: 1.1 mg/dL (ref 0.61–1.24)
GFR, Estimated: >60 mL/min (ref >60)
Glucose, Bld: 122 mg/dL — ABNORMAL HIGH (ref 70–99)
Potassium: 4.4 mmol/L (ref 3.5–5.1)
Sodium: 131 mmol/L — ABNORMAL LOW (ref 135–145)

## 2022-07-23 LAB — GLUCOSE, CAPILLARY
Glucose-Capillary: 97 mg/dL (ref 70–99)
Glucose-Capillary: 125 mg/dL — ABNORMAL HIGH (ref 70–99)
Glucose-Capillary: 193 mg/dL — ABNORMAL HIGH (ref 70–99)
Glucose-Capillary: 129 mg/dL — ABNORMAL HIGH (ref 70–99)

## 2022-07-23 MED ORDER — METHYLPREDNISOLONE SODIUM SUCC 125 MG IJ SOLR
60.0000 mg | Freq: Two times a day (BID) | INTRAMUSCULAR | Status: DC
  Administered 2022-07-23 – 2022-07-24 (×3): 60 mg via INTRAVENOUS
  Filled 2022-07-23 (×3): qty 2

## 2022-07-23 NOTE — Progress Notes (Signed)
PROGRESS NOTE Warren [name]  ZOX:[id number] DOB: 13-May-1950 DOA: 07/20/2022 PCP: Erskine Emery, NP  Brief Narrative/Hospital Course: As per prior documentation done by Lanae Boast, MD: 72 year old male with complicated medical history including PVD PAF on Eliquis mild to moderate aortic stenosis, COPD, chronic hypoxic respiratory failure on 3 L [place], HLD, history of pancreatitis, history of PE in 2018, BPH, anxiety, OSA, chronic back pain, dementia, GERD presented to the ED with right-sided chest pain and shortness of breath.  Patient had a mechanical fall on Friday - 5 days PTA, in the [employer] when he fell on the cart.  Having right-sided chest pain since then.  Reports pulse ox in the 80s on 3 L nasal cannula at home.   In the ED, afebrile BP fairly stable needing 3 to nasal cannula and SpO2 92%.  Labs with sodium 129 normal LFTs troponin -17-14, fairly stable CBC Imaging with chest x-ray and CT angio chest: COPD changes with chronic interstitial prominence aortic atherosclerosis: CTA showed no PE but right third and fourth rib fractures anteriorly with no complicating factors, inspissated material within the lower lobe bronchial tree b/l. On exam he was noted to have wheezing on auscultation.  Patient subsequently admitted for acute COPD exacerbation, acute on chronic hypoxic respiratory failure and right third and fourth rib fracture"..  07/23/2022: Patient seen.  Patient continues to wheeze.  Pain seems reasonably controlled.    Subjective: -Right-sided rib pain  -Wheezing.   Assessment and Plan: Principal Problem:   Rib fractures Active Problems:   COPD with acute exacerbation (HCC)   Hyponatremia   Paroxysmal A-fib (HCC)   Hyperlipidemia   BPH (benign prostatic hyperplasia)   Acute anterior right third and fourth rib fracture due to fall 5 days PTA:  Overall pain much better on current regimen continue with scheduled Tylenol, lidocaine patch Toradol as needed and Oxy IR PRN,  gabapentin.  Continue pulmonary toileting, antitussives and supplemental oxygen.  Complains of chest pain with coughing spells-add antitussives 07/23/2022: Adequate pain control.   Acute COPD exacerbation: Slowly improving continues to have wheezing and diminished breath sounds, continue with doxycycline, current IV Solu-Medrol, Yupelri/Brovana//Pulmicort neb, duoneb and supplemental oxygen  07/23/2022: Increase IV Solu-Medrol from 80 Mg once daily to 60 Mg twice daily.   Acute on chronic hypoxic respiratory failure: Multifactorial due to splinting/right rib fractures and COPD exacerbation.  Continue supplemental oxygen, continue to address underlying pain and COPD exacerbation as above.   Hyponatremia sodium at 129> 125  urine sod came at <10 and usmol at 279 .  Will do fluid challenge given very low urine sodium, monitor hold off salt tab Recent Labs  Lab 07/20/22 1710 07/21/22 0755 07/22/22 0338 07/23/22 0259  NA 129* 127* 125* 131*    Mild AKI creatinine slightly bumped 1.3 IV fluids started monitor 07/23/2022: AKI has resolved.  DC IV fluids.  PAF in NSR on Eliquis HLD continue Lipitor BPH continue Flomax/Avodart GERD continue PPI Mild dementia -continue home Aricept.  No behavioral problems.  DVT prophylaxis: Eliquis Code Status:   Code Status: Full Code Family Communication: plan of care discussed with patient at bedside. Patient status is: Inpatient because of COPD exacerbation Level of care: Telemetry Medical   Dispo: The patient is from: home alone            Anticipated disposition: TBD  Objective: Vitals last 24 hrs: Vitals:   07/22/22 2011 07/22/22 2026 07/23/22 0500 07/23/22 0818  BP:  (!) 167/90 (!) 151/72 (!) 157/89  Pulse:  82 67 62  Resp:  18  16  Temp:  98.6 F (37 C) 98.9 F (37.2 C) 99.5 F (37.5 C)  TempSrc:  Oral Oral Oral  SpO2: 98% 98% 98% 100%   Weight change:   Physical Examination: General exam: Awake and alert.  Not in any distress.    HEENT: Mild pallor.  No jaundice.   Respiratory system: Decreased air entry.  Expiratory wheeze.   Cardiovascular system: S1 & S2. Gastrointestinal system: Abdomen soft,NT,ND, BS+ Nervous System:Alert, awake, moves extremities.     Medications reviewed:  Scheduled Meds:  acetaminophen  1,000 mg Oral Q8H   apixaban  5 mg Oral BID   arformoterol  15 mcg Nebulization BID   atorvastatin  40 mg Oral Daily   budesonide (PULMICORT) nebulizer solution  0.25 mg Nebulization BID   cyanocobalamin  1,000 mcg Oral Daily   cycloSPORINE  1 drop Left Eye BID   donepezil  10 mg Oral QHS   doxycycline  100 mg Oral Q12H   dutasteride  0.5 mg Oral Daily   gabapentin  300 mg Oral TID   insulin aspart  0-5 Units Subcutaneous QHS   insulin aspart  0-9 Units Subcutaneous TID WC   ipratropium-albuterol  3 mL Nebulization BID   lidocaine  1 patch Transdermal Q24H   methylPREDNISolone (SOLU-MEDROL) injection  80 mg Intravenous Daily   mirtazapine  30 mg Oral QHS   pantoprazole  40 mg Oral Daily   revefenacin  175 mcg Nebulization Daily   tamsulosin  0.4 mg Oral Daily  Continuous Infusions:  sodium chloride 75 mL/hr at 07/22/22 1152    Diet Order             Diet Heart Room service appropriate? Yes; Fluid consistency: Thin  Diet effective now                  Intake/Output Summary (Last 24 hours) at 07/23/2022 1339 Last data filed at 07/23/2022 0800 Gross per 24 hour  Intake 1868.82 ml  Output 2475 ml  Net -606.18 ml    Net IO Since Admission: -1,179.12 mL [07/23/22 1339]  Wt Readings from Last 3 Encounters:  05/04/21 70.5 kg  09/29/20 82.3 kg  08/11/20 84.5 kg     Unresulted Labs (From admission, onward)     Start     Ordered   07/22/22 0500  Basic metabolic panel  Daily,   R      07/21/22 0801          Data Reviewed: I have personally reviewed following labs and imaging studies CBC: Recent Labs  Lab 07/20/22 1710 07/22/22 0338  WBC 9.3 8.9  NEUTROABS 6.5  --   HGB  11.7* 9.9*  HCT 35.0* 29.9*  MCV 96.2 96.5  PLT 284 234    Basic Metabolic Panel: Recent Labs  Lab 07/20/22 1710 07/20/22 2140 07/21/22 0755 07/22/22 0338 07/23/22 0259  NA 129*  --  127* 125* 131*  K 4.3  --  4.4 4.6 4.4  CL 92*  --  93* 94* 96*  CO2 26  --  25 27 25   GLUCOSE 93  --  238* 140* 122*  BUN 17  --  18 22 27*  CREATININE 1.12  --  1.03 1.32* 1.10  CALCIUM 9.1  --  8.8* 8.7* 8.6*  MG  --  2.1  --   --   --     GFR: CrCl cannot be calculated (Unknown ideal weight.).  Liver Function Tests: Recent Labs  Lab 07/20/22 1710  AST 25  ALT 17  ALKPHOS 65  BILITOT 0.7  PROT 7.0  ALBUMIN 3.4*    Recent Labs  Lab 07/20/22 2140  LIPASE 32   HbA1C: Recent Labs    07/22/22 0338  HGBA1C 6.0*    CBG: Recent Labs  Lab 07/22/22 1124 07/22/22 1604 07/22/22 2035 07/23/22 0817 07/23/22 1121  GLUCAP 169* 234* 114* 97 125*   No results found for this or any previous visit (from the past 240 hour(s)).  Antimicrobials: Anti-infectives (From admission, onward)    Start     Dose/Rate Route Frequency Ordered Stop   07/21/22 1000  doxycycline (VIBRA-TABS) tablet 100 mg        100 mg Oral Every 12 hours 07/21/22 0629     07/20/22 2100  cefTRIAXone (ROCEPHIN) 1 g in sodium chloride 0.9 % 100 mL IVPB        1 g 200 mL/hr over 30 Minutes Intravenous  Once 07/20/22 2049 07/20/22 2313   07/20/22 2100  doxycycline (VIBRA-TABS) tablet 100 mg        100 mg Oral  Once 07/20/22 2049 07/20/22 2133      Culture/Microbiology    Component Value Date/Time   SDES  08/06/2020 0240    BLOOD RIGHT ARM Performed at [facility], [address] [place], [place] [address]    SPECREQUEST  08/06/2020 0240    BOTTLES DRAWN AEROBIC AND ANAEROBIC Blood Culture adequate volume Performed at [facility], [address] [place], [place] [address]    CULT  08/06/2020 0240    NO GROWTH 5 DAYS Performed at [facility], [address],  [place], [place] [address]    REPTSTATUS 08/11/2020 FINAL 08/06/2020 0240    Radiology Studies: No results found.   LOS: 2 days   Barnetta Chapel, MD [facility]  07/23/2022, 1:39 PM

## 2022-07-23 NOTE — Plan of Care (Signed)
  Problem: Clinical Measurements: Goal: Respiratory complications will improve Outcome: Progressing   Problem: Activity: Goal: Risk for activity intolerance will decrease Outcome: Progressing   Problem: Pain Managment: Goal: General experience of comfort will improve Outcome: Not Progressing

## 2022-07-24 LAB — BASIC METABOLIC PANEL
Anion gap: 10 (ref 5–15)
BUN: 20 mg/dL (ref 8–23)
CO2: 26 mmol/L (ref 22–32)
Calcium: 9.2 mg/dL (ref 8.9–10.3)
Chloride: 94 mmol/L — ABNORMAL LOW (ref 98–111)
Creatinine, Ser: 1.23 mg/dL (ref 0.61–1.24)
GFR, Estimated: >60 mL/min (ref >60)
Glucose, Bld: 173 mg/dL — ABNORMAL HIGH (ref 70–99)
Potassium: 4.5 mmol/L (ref 3.5–5.1)
Sodium: 130 mmol/L — ABNORMAL LOW (ref 135–145)

## 2022-07-24 LAB — GLUCOSE, CAPILLARY
Glucose-Capillary: 151 mg/dL — ABNORMAL HIGH (ref 70–99)
Glucose-Capillary: 217 mg/dL — ABNORMAL HIGH (ref 70–99)
Glucose-Capillary: 147 mg/dL — ABNORMAL HIGH (ref 70–99)
Glucose-Capillary: 145 mg/dL — ABNORMAL HIGH (ref 70–99)

## 2022-07-24 MED ORDER — PREDNISONE 20 MG PO TABS
60.0000 mg | ORAL_TABLET | Freq: Every day | ORAL | Status: DC
  Administered 2022-07-25 – 2022-07-26 (×2): 60 mg via ORAL
  Filled 2022-07-24 (×2): qty 3

## 2022-07-24 NOTE — Progress Notes (Signed)
PROGRESS NOTE Warren [name]  ZOX:[id number] DOB: [birth year] DOA: 07/20/2022 PCP: Erskine Emery, NP  Brief Narrative/Hospital Course: Patient is a 72 year old male with past medical history significant for PVD, PAF on Eliquis mild to moderate aortic stenosis, COPD, chronic hypoxic respiratory failure on 3 L [place], HLD, history of pancreatitis, history of PE in 2018, BPH, anxiety, OSA, chronic back pain, dementia, GERD presented to the ED with right-sided chest pain and shortness of breath following a mechanical fall.  Imaging with chest x-ray and CT angio chest revealed COPD changes with chronic interstitial prominence, aortic atherosclerosis and right third and fourth rib fractures anteriorly with no complicating factors, inspissated material within the lower lobe bronchial tree b/l.  Pain is controlled.  However, patient developed significant COPD with exacerbation.  COPD exacerbation has improved.  Will change IV Solu-Medrol to oral prednisone.  Likely discharge in the next 24 hours.  07/23/2022: Patient seen.  Patient continues to wheeze.  Pain seems reasonably controlled. 07/24/2022: Patient seen.  Shortness of breath and wheezing have improved.  Will change IV Solu-Medrol to oral prednisone.  Optimize blood sugar control.  Likely discharge in the next 24 hours.    Subjective: -Right-sided rib pain  -Wheezing.   Assessment and Plan: Principal Problem:   Rib fractures Active Problems:   COPD with acute exacerbation (HCC)   Hyponatremia   Paroxysmal A-fib (HCC)   Hyperlipidemia   BPH (benign prostatic hyperplasia)   Acute anterior right third and fourth rib fracture due to fall 5 days PTA:  Overall pain much better on current regimen continue with scheduled Tylenol, lidocaine patch Toradol as needed and Oxy IR PRN, gabapentin.  Continue pulmonary toileting, antitussives and supplemental oxygen.  Complains of chest pain with coughing spells-add antitussives 07/24/2022: Adequate pain control.    Acute COPD exacerbation: Slowly improving continues to have wheezing and diminished breath sounds, continue with doxycycline, current IV Solu-Medrol, Yupelri/Brovana//Pulmicort neb, duoneb and supplemental oxygen  07/23/2022: Increase IV Solu-Medrol from 80 Mg once daily to 60 Mg twice daily. 07/24/2022: Improvement in symptoms.  Will change IV Solu-Medrol to oral prednisone.  Optimize blood sugar control.  Likely discharge in the next 24 hours.   Acute on chronic hypoxic respiratory failure: Multifactorial due to splinting/right rib fractures and COPD exacerbation.  Continue supplemental oxygen, continue to address underlying pain and COPD exacerbation as above.   Hyponatremia sodium at 129> 125  urine sod came at <10 and usmol at 279 .  Will do fluid challenge given very low urine sodium, monitor hold off salt tab Recent Labs  Lab 07/20/22 1710 07/21/22 0755 07/22/22 0338 07/23/22 0259 07/24/22 0139  NA 129* 127* 125* 131* 130*    Mild AKI creatinine slightly bumped 1.3 IV fluids started monitor 07/23/2022: AKI has resolved.  DC IV fluids.  PAF in NSR on Eliquis HLD continue Lipitor BPH continue Flomax/Avodart GERD continue PPI Mild dementia -continue home Aricept.  No behavioral problems.  DVT prophylaxis: Eliquis Code Status:   Code Status: Full Code Family Communication: plan of care discussed with patient at bedside. Patient status is: Inpatient because of COPD exacerbation Level of care: Telemetry Medical   Dispo: The patient is from: home alone            Anticipated disposition: TBD  Objective: Vitals last 24 hrs: Vitals:   07/24/22 0454 07/24/22 0756 07/24/22 0856 07/24/22 1310  BP: (!) 151/77 (!) 168/87  (!) 157/84  Pulse: 65 65  81  Resp: 12 18  18  Temp: 97.7 F (36.5 C) 98 F (36.7 C)  97.6 F (36.4 C)  TempSrc: Oral     SpO2: 91% 93% 92% 94%   Weight change:   Physical Examination: General exam: Awake and alert.  Not in any distress.   HEENT: Mild  pallor.  No jaundice.   Respiratory system: Improved air entry.  Wheezing has improved as well.   Cardiovascular system: S1 & S2. Gastrointestinal system: Abdomen soft,NT,ND, BS+ Nervous System:Alert, awake, moves extremities.     Medications reviewed:  Scheduled Meds:  acetaminophen  1,000 mg Oral Q8H   apixaban  5 mg Oral BID   arformoterol  15 mcg Nebulization BID   atorvastatin  40 mg Oral Daily   budesonide (PULMICORT) nebulizer solution  0.25 mg Nebulization BID   cyanocobalamin  1,000 mcg Oral Daily   cycloSPORINE  1 drop Left Eye BID   donepezil  10 mg Oral QHS   doxycycline  100 mg Oral Q12H   dutasteride  0.5 mg Oral Daily   gabapentin  300 mg Oral TID   insulin aspart  0-5 Units Subcutaneous QHS   insulin aspart  0-9 Units Subcutaneous TID WC   ipratropium-albuterol  3 mL Nebulization BID   lidocaine  1 patch Transdermal Q24H   mirtazapine  30 mg Oral QHS   pantoprazole  40 mg Oral Daily   [START ON 07/25/2022] predniSONE  60 mg Oral Q breakfast   revefenacin  175 mcg Nebulization Daily   tamsulosin  0.4 mg Oral Daily  Continuous Infusions:    Diet Order             Diet Heart Room service appropriate? Yes; Fluid consistency: Thin  Diet effective now                  Intake/Output Summary (Last 24 hours) at 07/24/2022 1751 Last data filed at 07/24/2022 1227 Gross per 24 hour  Intake 240 ml  Output 3800 ml  Net -3560 ml    Net IO Since Admission: -4,999.12 mL [07/24/22 1751]  Wt Readings from Last 3 Encounters:  05/04/21 70.5 kg  09/29/20 82.3 kg  08/11/20 84.5 kg     Unresulted Labs (From admission, onward)     Start     Ordered   07/22/22 0500  Basic metabolic panel  Daily,   R      07/21/22 0801          Data Reviewed: I have personally reviewed following labs and imaging studies CBC: Recent Labs  Lab 07/20/22 1710 07/22/22 0338  WBC 9.3 8.9  NEUTROABS 6.5  --   HGB 11.7* 9.9*  HCT 35.0* 29.9*  MCV 96.2 96.5  PLT 284 234     Basic Metabolic Panel: Recent Labs  Lab 07/20/22 1710 07/20/22 2140 07/21/22 0755 07/22/22 0338 07/23/22 0259 07/24/22 0139  NA 129*  --  127* 125* 131* 130*  K 4.3  --  4.4 4.6 4.4 4.5  CL 92*  --  93* 94* 96* 94*  CO2 26  --  25 27 25 26   GLUCOSE 93  --  238* 140* 122* 173*  BUN 17  --  18 22 27* 20  CREATININE 1.12  --  1.03 1.32* 1.10 1.23  CALCIUM 9.1  --  8.8* 8.7* 8.6* 9.2  MG  --  2.1  --   --   --   --     GFR: CrCl cannot be calculated (Unknown ideal weight.). Liver Function  Tests: Recent Labs  Lab 07/20/22 1710  AST 25  ALT 17  ALKPHOS 65  BILITOT 0.7  PROT 7.0  ALBUMIN 3.4*    Recent Labs  Lab 07/20/22 2140  LIPASE 32   HbA1C: Recent Labs    07/22/22 0338  HGBA1C 6.0*    CBG: Recent Labs  Lab 07/23/22 1619 07/23/22 2006 07/24/22 0758 07/24/22 1118 07/24/22 1559  GLUCAP 193* 129* 151* 217* 147*   No results found for this or any previous visit (from the past 240 hour(s)).  Antimicrobials: Anti-infectives (From admission, onward)    Start     Dose/Rate Route Frequency Ordered Stop   07/21/22 1000  doxycycline (VIBRA-TABS) tablet 100 mg        100 mg Oral Every 12 hours 07/21/22 0629     07/20/22 2100  cefTRIAXone (ROCEPHIN) 1 g in sodium chloride 0.9 % 100 mL IVPB        1 g 200 mL/hr over 30 Minutes Intravenous  Once 07/20/22 2049 07/20/22 2313   07/20/22 2100  doxycycline (VIBRA-TABS) tablet 100 mg        100 mg Oral  Once 07/20/22 2049 07/20/22 2133      Culture/Microbiology    Component Value Date/Time   SDES  08/06/2020 0240    BLOOD RIGHT ARM Performed at [facility], [address], [place], [place] [address]    SPECREQUEST  08/06/2020 0240    BOTTLES DRAWN AEROBIC AND ANAEROBIC Blood Culture adequate volume Performed at [facility], [address], [place], [place] [address]    CULT  08/06/2020 0240    NO GROWTH 5 DAYS Performed at [facility], [address]  [place], [place] [address]    REPTSTATUS 08/11/2020 FINAL 08/06/2020 0240    Radiology Studies: No results found.   LOS: 3 days   Barnetta Chapel, MD [facility]  07/24/2022, 5:51 PM

## 2022-07-25 DIAGNOSIS — S2241XA Multiple fractures of ribs, right side, initial encounter for closed fracture: Secondary | ICD-10-CM | POA: Diagnosis not present

## 2022-07-25 DIAGNOSIS — E871 Hypo-osmolality and hyponatremia: Secondary | ICD-10-CM

## 2022-07-25 DIAGNOSIS — E785 Hyperlipidemia, unspecified: Secondary | ICD-10-CM | POA: Diagnosis not present

## 2022-07-25 DIAGNOSIS — J441 Chronic obstructive pulmonary disease with (acute) exacerbation: Secondary | ICD-10-CM | POA: Diagnosis not present

## 2022-07-25 DIAGNOSIS — I480 Paroxysmal atrial fibrillation: Secondary | ICD-10-CM

## 2022-07-25 LAB — BASIC METABOLIC PANEL
Anion gap: 13 (ref 5–15)
BUN: 25 mg/dL — ABNORMAL HIGH (ref 8–23)
CO2: 27 mmol/L (ref 22–32)
Calcium: 9.3 mg/dL (ref 8.9–10.3)
Chloride: 93 mmol/L — ABNORMAL LOW (ref 98–111)
Creatinine, Ser: 1.3 mg/dL — ABNORMAL HIGH (ref 0.61–1.24)
GFR, Estimated: 58 mL/min — ABNORMAL LOW (ref >60)
Glucose, Bld: 119 mg/dL — ABNORMAL HIGH (ref 70–99)
Potassium: 4.4 mmol/L (ref 3.5–5.1)
Sodium: 133 mmol/L — ABNORMAL LOW (ref 135–145)

## 2022-07-25 LAB — GLUCOSE, CAPILLARY
Glucose-Capillary: 109 mg/dL — ABNORMAL HIGH (ref 70–99)
Glucose-Capillary: 129 mg/dL — ABNORMAL HIGH (ref 70–99)
Glucose-Capillary: 243 mg/dL — ABNORMAL HIGH (ref 70–99)
Glucose-Capillary: 259 mg/dL — ABNORMAL HIGH (ref 70–99)
Glucose-Capillary: 111 mg/dL — ABNORMAL HIGH (ref 70–99)

## 2022-07-25 NOTE — Progress Notes (Signed)
PROGRESS NOTE    Warren [name]  NFA:[id number] DOB: [birth year] DOA: 07/20/2022 PCP: Erskine Emery, NP    Brief Narrative:  72 year old male with past medical history of PVD, paroxysmal atrial fibrillation on Eliquis, mild to moderate aortic stenosis, COPD, chronic hypoxic respiratory failure on 3 L [place], hyperlipidemia, history of pancreatitis, history of pulmonary embolism in 2018, BPH, anxiety, OSA, chronic back pain, dementia, GERD hospital with right-sided chest pain and shortness of breath status post mechanical fall 5 days prior to the presentation.  Initially patient was hypoxic and required supplemental oxygen.  Labs showed hyponatremia with sodium of 129.  Troponin 17-14.  Chest x-ray and CT angiogram of the chest showed COPD with positive FOBT.  Patient was then admitted to hospital for acute COPD exacerbation, acute on chronic hypoxic respiratory failure and right third and fourth rib fracture.  Assessment and plan.  Acute anterior right third and fourth rib fracture due to fall 5 days PTA:  Patient has overall improved with Tylenol Lidoderm Toradol and Oxy IR with gabapentin.  Continue antitussives and focus on pain control.  Encouraged incentive spirometry.  Still has a lot of wheezing cough and discomfort and has not mobilized himself much.  Acute COPD exacerbation: Has ongoing wheezing.  Received doxycycline, IV Solu-Medrol, Yupelri/Brovana//Pulmicort neb, duoneb and supplemental oxygen.  Will change to prednisone starting today.  Patient is worried about high blood glucose levels.  Hyperglycemia likely secondary to steroids.  On insulin regimen.  Patient is worried about it.   Acute on chronic hypoxic respiratory failure: Multifactorial due to splinting/right rib fractures and COPD exacerbation.  Currently on 3 L of oxygen by nasal cannula.  Patient is on 3 L of oxygen at baseline.  Hyponatremia  Latest sodium of 133.  Will monitor.  Deconditioning debility.  Will get active  mobilization today.   DVT prophylaxis:  apixaban (ELIQUIS) tablet 5 mg   Code Status:     Code Status: Full Code  Disposition: Home likely 07/26/2022 continues to feel clinically well.  Status is: Inpatient Remains inpatient appropriate because: COPD exacerbation, rib fractures,   Family Communication: None at bedside  Consultants:  None  Procedures:  None  Antimicrobials:  Doxycycline  Anti-infectives (From admission, onward)    Start     Dose/Rate Route Frequency Ordered Stop   07/21/22 1000  doxycycline (VIBRA-TABS) tablet 100 mg        100 mg Oral Every 12 hours 07/21/22 0629     07/20/22 2100  cefTRIAXone (ROCEPHIN) 1 g in sodium chloride 0.9 % 100 mL IVPB        1 g 200 mL/hr over 30 Minutes Intravenous  Once 07/20/22 2049 07/20/22 2313   07/20/22 2100  doxycycline (VIBRA-TABS) tablet 100 mg        100 mg Oral  Once 07/20/22 2049 07/20/22 2133        Subjective: Today, patient was seen and examined at bedside.  Complains of rib pain, cough congestion wheezing.  Feels weak and states that he has not mobilized himself much.  Worried about blood glucose levels being high.  Objective: Vitals:   07/25/22 0633 07/25/22 0739 07/25/22 0744 07/25/22 0746  BP: (!) 163/92 134/79    Pulse: 71 61    Resp: 16 16    Temp: 98.1 F (36.7 C) 97.8 F (36.6 C)    TempSrc:  Oral    SpO2:  96% 93% 94%    Intake/Output Summary (Last 24 hours) at 07/25/2022 1103 Last data  filed at 07/25/2022 0744 Gross per 24 hour  Intake 720 ml  Output 1075 ml  Net -355 ml   There were no vitals filed for this visit.  Physical Examination: There is no height or weight on file to calculate BMI.  General:  Average built,  on nasal cannula oxygen, on mild distress HENT:   No scleral pallor or icterus noted. Oral mucosa is moist.  Chest: Diminished breath sounds bilaterally wheezing and coarse breath sounds noted.  Tenderness over the chest wall on palpation. CVS: S1 &S2 heard. No murmur.   Regular rate and rhythm. Abdomen: Soft, nontender, nondistended.  Bowel sounds are heard.   Extremities: No cyanosis, clubbing or edema.  Peripheral pulses are palpable. Psych: Alert, awake and oriented, mildly anxious CNS:  No cranial nerve deficits.  Power equal in all extremities.   Skin: Warm and dry.  No rashes noted.  Data Reviewed:   CBC: Recent Labs  Lab 07/20/22 1710 07/22/22 0338  WBC 9.3 8.9  NEUTROABS 6.5  --   HGB 11.7* 9.9*  HCT 35.0* 29.9*  MCV 96.2 96.5  PLT 284 234    Basic Metabolic Panel: Recent Labs  Lab 07/20/22 2140 07/21/22 0755 07/22/22 0338 07/23/22 0259 07/24/22 0139 07/25/22 0104  NA  --  127* 125* 131* 130* 133*  K  --  4.4 4.6 4.4 4.5 4.4  CL  --  93* 94* 96* 94* 93*  CO2  --  25 27 25 26 27   GLUCOSE  --  238* 140* 122* 173* 119*  BUN  --  18 22 27* 20 25*  CREATININE  --  1.03 1.32* 1.10 1.23 1.30*  CALCIUM  --  8.8* 8.7* 8.6* 9.2 9.3  MG 2.1  --   --   --   --   --     Liver Function Tests: Recent Labs  Lab 07/20/22 1710  AST 25  ALT 17  ALKPHOS 65  BILITOT 0.7  PROT 7.0  ALBUMIN 3.4*     Radiology Studies: No results found.    LOS: 4 days    Joycelyn Das, MD [facility] Available via Epic secure chat 7am-7pm After these hours, please refer to coverage provider listed on [email or web address] 07/25/2022, 11:03 AM

## 2022-07-25 NOTE — Care Management Important Message (Signed)
Important Message  Patient Details  Name: Warren [name] MRN: [id number] Date of Birth: [birth year]   Medicare Important Message Given:  Yes     [name] 07/25/2022, 12:26 PM

## 2022-07-26 DIAGNOSIS — S2241XA Multiple fractures of ribs, right side, initial encounter for closed fracture: Secondary | ICD-10-CM | POA: Diagnosis not present

## 2022-07-26 DIAGNOSIS — N400 Enlarged prostate without lower urinary tract symptoms: Secondary | ICD-10-CM

## 2022-07-26 DIAGNOSIS — E871 Hypo-osmolality and hyponatremia: Secondary | ICD-10-CM | POA: Diagnosis not present

## 2022-07-26 DIAGNOSIS — E785 Hyperlipidemia, unspecified: Secondary | ICD-10-CM | POA: Diagnosis not present

## 2022-07-26 LAB — MAGNESIUM: Magnesium: 1.7 mg/dL (ref 1.7–2.4)

## 2022-07-26 LAB — CBC
HCT: 32.1 % — ABNORMAL LOW (ref 39.0–52.0)
Hemoglobin: 10.3 g/dL — ABNORMAL LOW (ref 13.0–17.0)
MCH: 31 pg (ref 26.0–34.0)
MCHC: 32.1 g/dL (ref 30.0–36.0)
MCV: 96.7 fL (ref 80.0–100.0)
Platelets: 249 10*3/uL (ref 150–400)
RBC: 3.32 MIL/uL — ABNORMAL LOW (ref 4.22–5.81)
RDW: 15.4 % (ref 11.5–15.5)
WBC: 8.3 10*3/uL (ref 4.0–10.5)
nRBC: 0 % (ref 0.0–0.2)

## 2022-07-26 LAB — BASIC METABOLIC PANEL
Anion gap: 7 (ref 5–15)
BUN: 31 mg/dL — ABNORMAL HIGH (ref 8–23)
CO2: 30 mmol/L (ref 22–32)
Calcium: 9 mg/dL (ref 8.9–10.3)
Chloride: 94 mmol/L — ABNORMAL LOW (ref 98–111)
Creatinine, Ser: 1.19 mg/dL (ref 0.61–1.24)
GFR, Estimated: >60 mL/min (ref >60)
Glucose, Bld: 117 mg/dL — ABNORMAL HIGH (ref 70–99)
Potassium: 4 mmol/L (ref 3.5–5.1)
Sodium: 131 mmol/L — ABNORMAL LOW (ref 135–145)

## 2022-07-26 LAB — GLUCOSE, CAPILLARY: Glucose-Capillary: 96 mg/dL (ref 70–99)

## 2022-07-26 MED ORDER — LIDOCAINE 5 % EX PTCH: 1.0000 | MEDICATED_PATCH | CUTANEOUS | 0 refills | Status: DC

## 2022-07-26 MED ORDER — PREDNISONE 20 MG PO TABS: 40.0000 mg | ORAL_TABLET | Freq: Every day | ORAL | 0 refills | Status: AC

## 2022-07-26 MED ORDER — OXYCODONE HCL 5 MG PO TABS: 5.0000 mg | ORAL_TABLET | ORAL | 0 refills | Status: DC | PRN

## 2022-07-26 NOTE — Discharge Summary (Addendum)
Physician Discharge Summary  Warren [name] ZOX:[id number] DOB: Aug 12, 1950 DOA: 07/20/2022  PCP: Erskine Emery, NP  Admit date: 07/20/2022 Discharge date: 07/26/2022  Admitted From: Home  Discharge disposition: Home  Recommendations for Outpatient Follow-Up:   Follow up with your primary care provider in one week.  Check CBC, BMP, magnesium in the next visit Encourage deep breathing incentive spirometry.  Discharge Diagnosis:   Principal Problem:   Rib fractures Active Problems:   COPD with acute exacerbation (HCC)   Hyponatremia   Paroxysmal A-fib (HCC)   Hyperlipidemia   BPH (benign prostatic hyperplasia)  Discharge Condition: Improved.  Diet recommendation: Low sodium, heart healthy.    Wound care: None.  Code status: Full.   History of Present Illness:   72 year old male with past medical history of PVD, paroxysmal atrial fibrillation on Eliquis, mild to moderate aortic stenosis, COPD, chronic hypoxic respiratory failure on 3 L Bernard, hyperlipidemia, history of pancreatitis, history of pulmonary embolism in 2018, BPH, anxiety, OSA, chronic back pain, dementia, GERD hospital with right-sided chest pain and shortness of breath status post mechanical fall 5 days prior to the presentation.  Initially patient was hypoxic and required supplemental oxygen.  Labs showed hyponatremia with sodium of 129.  Troponin 17-14.  Chest x-ray and CT angiogram of the chest showed COPD with positive FOBT.  Patient was then admitted to hospital for acute COPD exacerbation, acute on chronic hypoxic respiratory failure and right third and fourth rib fracture.   Hospital Course:   Following conditions were addressed during hospitalization as listed below,  Acute anterior right third and fourth rib fracture due to fall 5 days PTA:  Patient has overall improved with Tylenol Lidoderm Toradol and Oxy IR with gabapentin.  Continue antitussives and focus on pain control on discharge..  Encouraged  incentive spirometry.    Acute COPD exacerbation: Received nebulizer and completed course of doxycycline while in the hospital.  Will continue with short course of p.o. steroids on discharge.  Patient does have multiple inhalers and nebulizers at home he does have baseline chronic hypoxic respiratory failure and baseline dyspnea   Hyperglycemia likely secondary to steroids.  Will have short course on discharge.   Acute on chronic hypoxic respiratory failure: Multifactorial due to splinting/right rib fractures and COPD exacerbation.  Currently on 3 L of oxygen by nasal cannula.  Patient is on 3 L of oxygen at baseline.  Has remained stable.   Hyponatremia  Mild.  Recommend outpatient monitoring.  Sodium prior to discharge was 131.   Deconditioning debility.  Was able to ambulate well prior to discharge.  Disposition.  At this time, patient is stable for disposition home with outpatient PCP follow-up.  Medical Consultants:   None.  Procedures:    None Subjective:   Today, patient was seen and examined at bedside.  Has some shortness of breath but at baseline.  Denies overt pain.  Wants to go home.  Was able to ambulate.  Discharge Exam:   Vitals:   07/25/22 2032 07/26/22 0300  BP: 135/73 125/60  Pulse: 79   Resp: 20   Temp: 97.8 F (36.6 C) (!) 97.3 F (36.3 C)  SpO2: 100%    Vitals:   07/25/22 2013 07/25/22 2018 07/25/22 2032 07/26/22 0300  BP:   135/73 125/60  Pulse:   79   Resp:   20   Temp:   97.8 F (36.6 C) (!) 97.3 F (36.3 C)  TempSrc:   Oral Oral  SpO2: 94% 95%  100%    There is no height or weight on file to calculate BMI.  General: Alert awake, not in obvious distress HENT: pupils equally reacting to light,  No scleral pallor or icterus noted. Oral mucosa is moist.  Chest:  Clear breath sounds.  Diminished breath sounds bilaterally. No crackles or wheezes.  CVS: S1 &S2 heard. No murmur.  Regular rate and rhythm. Abdomen: Soft, nontender, nondistended.   Bowel sounds are heard.   Extremities: No cyanosis, clubbing or edema.  Peripheral pulses are palpable. Psych: Alert, awake and oriented, normal mood CNS:  No cranial nerve deficits.  Power equal in all extremities.   Skin: Warm and dry.  No rashes noted.  The results of significant diagnostics from this hospitalization (including imaging, microbiology, ancillary and laboratory) are listed below for reference.     Diagnostic Studies:   CT Angio Chest PE W and/or Wo Contrast  Result Date: 07/20/2022 CLINICAL DATA:  Shortness of breath and recent fall with right-sided chest pain, initial encounter EXAM: CT ANGIOGRAPHY CHEST WITH CONTRAST TECHNIQUE: Multidetector CT imaging of the chest was performed using the standard protocol during bolus administration of intravenous contrast. Multiplanar CT image reconstructions and MIPs were obtained to evaluate the vascular anatomy. RADIATION DOSE REDUCTION: This exam was performed according to the departmental dose-optimization program which includes automated exposure control, adjustment of the mA and/or kV according to patient size and/or use of iterative reconstruction technique. CONTRAST:  75mL OMNIPAQUE IOHEXOL 350 MG/ML SOLN COMPARISON:  Chest x-ray from earlier in the same day. FINDINGS: Cardiovascular: Thoracic aorta demonstrates atherosclerotic calcifications without aneurysmal dilatation or dissection. No cardiac enlargement is seen. Coronary calcifications are noted. No definitive filling defect to suggest pulmonary emboli are seen. Mediastinum/Nodes: Thoracic inlet is within normal limits. No hilar or mediastinal adenopathy is noted. The esophagus is within normal limits with the exception of a sliding-type hiatal hernia. Lungs/Pleura: Lungs are well aerated bilaterally. Some inspissated material is noted within the lower lobe bronchial tree bilaterally. Diffuse emphysematous changes are noted. No sizable parenchymal nodule is seen. No effusion or  pneumothorax is seen. Upper Abdomen: Gallbladder has been surgically removed. The remainder of the upper abdomen is within normal limits. Musculoskeletal: Mildly displaced fractures of the right third and fourth ribs are noted anteriorly. No other fracture is seen. Review of the MIP images confirms the above findings. IMPRESSION: No evidence of pulmonary emboli. Right third and fourth rib fractures anteriorly with no complicating factors. Inspissated material within the lower lobe bronchial tree bilaterally. Hiatal hernia. Aortic Atherosclerosis (ICD10-I70.0) and Emphysema (ICD10-J43.9). Electronically Signed   By: Alcide Clever M.D.   On: 07/20/2022 23:16   DG Chest 2 View  Result Date: 07/20/2022 CLINICAL DATA:  Shortness of breath, RIGHT chest pain, recent fall EXAM: CHEST - 2 VIEW COMPARISON:  08/05/2020 FINDINGS: Normal heart size, mediastinal contours, and pulmonary vascularity. Atherosclerotic calcification aorta. Emphysematous and bronchitic changes consistent with COPD. Chronic interstitial prominence greater in RIGHT lung than LEFT. No definite acute infiltrate, pleural effusion, or pneumothorax. Old fracture deformities of lower lateral RIGHT ribs. No acute osseous findings. IMPRESSION: COPD changes with chronic interstitial prominence, slightly greater on RIGHT, minimally progressive since 2022. No definite acute abnormalities. Aortic Atherosclerosis (ICD10-I70.0) and Emphysema (ICD10-J43.9). Electronically Signed   By: Ulyses Southward M.D.   On: 07/20/2022 17:39     Labs:   Basic Metabolic Panel: Recent Labs  Lab 07/20/22 2140 07/21/22 0755 07/22/22 0338 07/23/22 0259 07/24/22 0139 07/25/22 0104 07/26/22 0301  NA  --    < >  125* 131* 130* 133* 131*  K  --    < > 4.6 4.4 4.5 4.4 4.0  CL  --    < > 94* 96* 94* 93* 94*  CO2  --    < > 27 25 26 27 30   GLUCOSE  --    < > 140* 122* 173* 119* 117*  BUN  --    < > 22 27* 20 25* 31*  CREATININE  --    < > 1.32* 1.10 1.23 1.30* 1.19  CALCIUM   --    < > 8.7* 8.6* 9.2 9.3 9.0  MG 2.1  --   --   --   --   --  1.7   < > = values in this interval not displayed.   GFR CrCl cannot be calculated (Unknown ideal weight.). Liver Function Tests: Recent Labs  Lab 07/20/22 1710  AST 25  ALT 17  ALKPHOS 65  BILITOT 0.7  PROT 7.0  ALBUMIN 3.4*   Recent Labs  Lab 07/20/22 2140  LIPASE 32   No results for input(s): "AMMONIA" in the last 168 hours. Coagulation profile No results for input(s): "INR", "PROTIME" in the last 168 hours.  CBC: Recent Labs  Lab 07/20/22 1710 07/22/22 0338 07/26/22 0301  WBC 9.3 8.9 8.3  NEUTROABS 6.5  --   --   HGB 11.7* 9.9* 10.3*  HCT 35.0* 29.9* 32.1*  MCV 96.2 96.5 96.7  PLT 284 234 249   Cardiac Enzymes: No results for input(s): "CKTOTAL", "CKMB", "CKMBINDEX", "TROPONINI" in the last 168 hours. BNP: Invalid input(s): "POCBNP" CBG: Recent Labs  Lab 07/25/22 1137 07/25/22 1634 07/25/22 1637 07/25/22 2306 07/26/22 0804  GLUCAP 129* 259* 243* 111* 96   D-Dimer No results for input(s): "DDIMER" in the last 72 hours. Hgb A1c No results for input(s): "HGBA1C" in the last 72 hours. Lipid Profile No results for input(s): "CHOL", "HDL", "LDLCALC", "TRIG", "CHOLHDL", "LDLDIRECT" in the last 72 hours. Thyroid function studies No results for input(s): "TSH", "T4TOTAL", "T3FREE", "THYROIDAB" in the last 72 hours.  Invalid input(s): "FREET3" Anemia work up No results for input(s): "VITAMINB12", "FOLATE", "FERRITIN", "TIBC", "IRON", "RETICCTPCT" in the last 72 hours. Microbiology No results found for this or any previous visit (from the past 240 hour(s)).   Discharge Instructions:   Discharge Instructions     Diet - low sodium heart healthy   Complete by: As directed    Discharge instructions   Complete by: As directed    Follow-up with your primary care provider in 1 week. Seek medical attention for worsening symptoms. Take deep breaths and  continue incentive spirometry. NO  overexertion. Continue oxygen and nebulizers at home.   Increase activity slowly   Complete by: As directed       Allergies as of 07/26/2022   No Known Allergies      Medication List     TAKE these medications    apixaban 5 MG Tabs tablet Commonly known as: ELIQUIS Take 1 tablet (5 mg total) by mouth 2 (two) times daily.   atorvastatin 40 MG tablet Commonly known as: LIPITOR Take 1 tablet (40 mg total) by mouth daily.   cyanocobalamin 1000 MCG tablet Take 1,000 mcg by mouth daily.   donepezil 10 MG tablet Commonly known as: ARICEPT Take 10 mg by mouth at bedtime.   dutasteride 0.5 MG capsule Commonly known as: AVODART Take 0.5 mg by mouth daily.   Fluticasone-Umeclidin-Vilant 100-62.5-25 MCG/INH Aepb Inhale 1 puff into the  lungs daily. Trelegy   gabapentin 300 MG capsule Commonly known as: NEURONTIN Take 300 mg by mouth in the morning, at noon, in the evening, and at bedtime.   lidocaine 5 % Commonly known as: LIDODERM Place 1 patch onto the skin daily. Remove & Discard patch within 12 hours or as directed by MD   Meclizine HCl 25 MG Chew Chew 12.5 mg by mouth every 8 (eight) hours as needed (for dizziness).   mirtazapine 30 MG tablet Commonly known as: REMERON Take 30 mg by mouth at bedtime.   omeprazole 40 MG capsule Commonly known as: PRILOSEC Take 1 capsule (40 mg total) by mouth daily.   oxyCODONE 5 MG immediate release tablet Commonly known as: Oxy IR/ROXICODONE Take 1 tablet (5 mg total) by mouth every 4 (four) hours as needed for severe pain or breakthrough pain.   predniSONE 20 MG tablet Commonly known as: DELTASONE Take 2 tablets (40 mg total) by mouth daily with breakfast for 3 days. Start taking on: Jul 27, 2022   ProAir RespiClick 108 (90 Base) MCG/ACT Aepb Generic drug: Albuterol Sulfate Inhale 2 puffs into the lungs every 4 (four) hours as needed (for wheezing and shortness of breath).   promethazine 25 MG tablet Commonly known as:  PHENERGAN Take 25 mg by mouth every 8 (eight) hours as needed for nausea.   Restasis 0.05 % ophthalmic emulsion Generic drug: cycloSPORINE Place 1 drop into the left eye 2 (two) times daily.   tamsulosin 0.4 MG Caps capsule Commonly known as: FLOMAX Take 0.4 mg by mouth in the morning and at bedtime.   VITAMIN D (ERGOCALCIFEROL) PO Take 5,000 Units by mouth daily.        Follow-up Information     Erskine Emery, NP Follow up.   Contact information: [address] [place] [address] [phone]         [facility] Follow up on 10/24/2022.   Why: appointment scheduled for 10/24/2022 at 9:20 am with Dr.Sagardia to establish primary care Contact information: [address], [place], [place] [address] Phone: [phone]                 Time coordinating discharge: 39 minutes  Signed:  Lotoya Casella  [facility] 07/26/2022, 2:56 PM

## 2022-07-26 NOTE — Progress Notes (Signed)
Discharge instructions given. Patient verbalized understanding and all questions were answered.  ?

## 2022-07-26 NOTE — Plan of Care (Signed)

## 2022-08-30 ENCOUNTER — Other Ambulatory Visit: Payer: Self-pay | Admitting: Cardiovascular Disease

## 2022-08-30 DIAGNOSIS — I739 Peripheral vascular disease, unspecified: Secondary | ICD-10-CM

## 2022-08-30 DIAGNOSIS — E785 Hyperlipidemia, unspecified: Secondary | ICD-10-CM

## 2022-08-30 NOTE — Telephone Encounter (Signed)
Refill request

## 2022-10-24 ENCOUNTER — Ambulatory Visit (INDEPENDENT_AMBULATORY_CARE_PROVIDER_SITE_OTHER): Payer: Medicare (Managed Care)

## 2022-10-24 ENCOUNTER — Ambulatory Visit: Payer: Medicare (Managed Care) | Admitting: Emergency Medicine

## 2022-10-24 ENCOUNTER — Other Ambulatory Visit: Payer: Self-pay | Admitting: Emergency Medicine

## 2022-10-24 ENCOUNTER — Encounter: Payer: Self-pay | Admitting: Emergency Medicine

## 2022-10-24 VITALS — BP 128/86 | HR 78 | Temp 97.9°F | Ht 71.0 in | Wt 183.4 lb

## 2022-10-24 DIAGNOSIS — J441 Chronic obstructive pulmonary disease with (acute) exacerbation: Secondary | ICD-10-CM

## 2022-10-24 DIAGNOSIS — J22 Unspecified acute lower respiratory infection: Secondary | ICD-10-CM

## 2022-10-24 DIAGNOSIS — K219 Gastro-esophageal reflux disease without esophagitis: Secondary | ICD-10-CM | POA: Diagnosis not present

## 2022-10-24 DIAGNOSIS — Z7689 Persons encountering health services in other specified circumstances: Secondary | ICD-10-CM

## 2022-10-24 DIAGNOSIS — G894 Chronic pain syndrome: Secondary | ICD-10-CM

## 2022-10-24 DIAGNOSIS — E785 Hyperlipidemia, unspecified: Secondary | ICD-10-CM

## 2022-10-24 DIAGNOSIS — D649 Anemia, unspecified: Secondary | ICD-10-CM

## 2022-10-24 DIAGNOSIS — I480 Paroxysmal atrial fibrillation: Secondary | ICD-10-CM | POA: Diagnosis not present

## 2022-10-24 DIAGNOSIS — N400 Enlarged prostate without lower urinary tract symptoms: Secondary | ICD-10-CM

## 2022-10-24 LAB — LIPID PANEL
Cholesterol: 144 mg/dL (ref 0–200)
HDL: 74.4 mg/dL (ref >39.00)
LDL Cholesterol: 58 mg/dL (ref 0–99)
NonHDL: 69.36
Total CHOL/HDL Ratio: 2
Triglycerides: 58 mg/dL (ref 0.0–149.0)
VLDL: 11.6 mg/dL (ref 0.0–40.0)

## 2022-10-24 LAB — COMPREHENSIVE METABOLIC PANEL
ALT: 14 U/L (ref 0–53)
AST: 23 U/L (ref 0–37)
Albumin: 4 g/dL (ref 3.5–5.2)
Alkaline Phosphatase: 69 U/L (ref 39–117)
BUN: 21 mg/dL (ref 6–23)
CO2: 31 mEq/L (ref 19–32)
Calcium: 9.4 mg/dL (ref 8.4–10.5)
Chloride: 96 mEq/L (ref 96–112)
Creatinine, Ser: 1.25 mg/dL (ref 0.40–1.50)
GFR: 56.83 mL/min — ABNORMAL LOW (ref >60.00)
Glucose, Bld: 117 mg/dL — ABNORMAL HIGH (ref 70–99)
Potassium: 4.5 mEq/L (ref 3.5–5.1)
Sodium: 133 mEq/L — ABNORMAL LOW (ref 135–145)
Total Bilirubin: 0.5 mg/dL (ref 0.2–1.2)
Total Protein: 7 g/dL (ref 6.0–8.3)

## 2022-10-24 LAB — CBC WITH DIFFERENTIAL/PLATELET
Basophils Absolute: 0 10*3/uL (ref 0.0–0.1)
Basophils Relative: 0.3 % (ref 0.0–3.0)
Eosinophils Absolute: 0.4 10*3/uL (ref 0.0–0.7)
Eosinophils Relative: 4.7 % (ref 0.0–5.0)
HCT: 27.3 % — ABNORMAL LOW (ref 39.0–52.0)
Hemoglobin: 8.9 g/dL — ABNORMAL LOW (ref 13.0–17.0)
Lymphocytes Relative: 13.2 % (ref 12.0–46.0)
Lymphs Abs: 1.2 10*3/uL (ref 0.7–4.0)
MCHC: 32.7 g/dL (ref 30.0–36.0)
MCV: 96.3 fl (ref 78.0–100.0)
Monocytes Absolute: 1.1 10*3/uL — ABNORMAL HIGH (ref 0.1–1.0)
Monocytes Relative: 12.3 % — ABNORMAL HIGH (ref 3.0–12.0)
Neutro Abs: 6.3 10*3/uL (ref 1.4–7.7)
Neutrophils Relative %: 69.5 % (ref 43.0–77.0)
Platelets: 433 10*3/uL — ABNORMAL HIGH (ref 150.0–400.0)
RBC: 2.84 Mil/uL — ABNORMAL LOW (ref 4.22–5.81)
RDW: 14.6 % (ref 11.5–15.5)
WBC: 9.1 10*3/uL (ref 4.0–10.5)

## 2022-10-24 LAB — HEMOGLOBIN A1C: Hgb A1c MFr Bld: 5.8 % (ref 4.6–6.5)

## 2022-10-24 MED ORDER — AMOXICILLIN-POT CLAVULANATE 875-125 MG PO TABS
1.0000 | ORAL_TABLET | Freq: Two times a day (BID) | ORAL | 0 refills | Status: AC
Start: 2022-10-24 — End: 2022-10-31

## 2022-10-24 NOTE — Assessment & Plan Note (Signed)
Well-controlled.  Continues tamsulosin 0.4 mg daily

## 2022-10-24 NOTE — Assessment & Plan Note (Signed)
Asymptomatic and well-controlled.  No concerns.

## 2022-10-24 NOTE — Patient Instructions (Signed)
Health Maintenance After Age 74 After age 74, you are at a higher risk for certain long-term diseases and infections as well as injuries from falls. Falls are a major cause of broken bones and head injuries in people who are older than age 74. Getting regular preventive care can help to keep you healthy and well. Preventive care includes getting regular testing and making lifestyle changes as recommended by your health care provider. Talk with your health care provider about: Which screenings and tests you should have. A screening is a test that checks for a disease when you have no symptoms. A diet and exercise plan that is right for you. What should I know about screenings and tests to prevent falls? Screening and testing are the best ways to find a health problem early. Early diagnosis and treatment give you the best chance of managing medical conditions that are common after age 74. Certain conditions and lifestyle choices may make you more likely to have a fall. Your health care provider may recommend: Regular vision checks. Poor vision and conditions such as cataracts can make you more likely to have a fall. If you wear glasses, make sure to get your prescription updated if your vision changes. Medicine review. Work with your health care provider to regularly review all of the medicines you are taking, including over-the-counter medicines. Ask your health care provider about any side effects that may make you more likely to have a fall. Tell your health care provider if any medicines that you take make you feel dizzy or sleepy. Strength and balance checks. Your health care provider may recommend certain tests to check your strength and balance while standing, walking, or changing positions. Foot health exam. Foot pain and numbness, as well as not wearing proper footwear, can make you more likely to have a fall. Screenings, including: Osteoporosis screening. Osteoporosis is a condition that causes  the bones to get weaker and break more easily. Blood pressure screening. Blood pressure changes and medicines to control blood pressure can make you feel dizzy. Depression screening. You may be more likely to have a fall if you have a fear of falling, feel depressed, or feel unable to do activities that you used to do. Alcohol use screening. Using too much alcohol can affect your balance and may make you more likely to have a fall. Follow these instructions at home: Lifestyle Do not drink alcohol if: Your health care provider tells you not to drink. If you drink alcohol: Limit how much you have to: 0-1 drink a day for women. 0-2 drinks a day for men. Know how much alcohol is in your drink. In the [place] one drink equals one 12 oz bottle of beer (355 mL), one 5 oz glass of wine (148 mL), or one 1 oz glass of hard liquor (44 mL). Do not use any products that contain nicotine or tobacco. These products include cigarettes, chewing tobacco, and vaping devices, such as e-cigarettes. If you need help quitting, ask your health care provider. Activity  Follow a regular exercise program to stay fit. This will help you maintain your balance. Ask your health care provider what types of exercise are appropriate for you. If you need a cane or walker, use it as recommended by your health care provider. Wear supportive shoes that have nonskid soles. Safety  Remove any tripping hazards, such as rugs, cords, and clutter. Install safety equipment such as grab bars in bathrooms and safety rails on stairs. Keep rooms and walkways   well-lit. General instructions Talk with your health care provider about your risks for falling. Tell your health care provider if: You fall. Be sure to tell your health care provider about all falls, even ones that seem minor. You feel dizzy, tiredness (fatigue), or off-balance. Take over-the-counter and prescription medicines only as told by your health care provider. These include  supplements. Eat a healthy diet and maintain a healthy weight. A healthy diet includes low-fat dairy products, low-fat (lean) meats, and fiber from whole grains, beans, and lots of fruits and vegetables. Stay current with your vaccines. Schedule regular health, dental, and eye exams. Summary Having a healthy lifestyle and getting preventive care can help to protect your health and wellness after age 74. Screening and testing are the best way to find a health problem early and help you avoid having a fall. Early diagnosis and treatment give you the best chance for managing medical conditions that are more common for people who are older than age 74. Falls are a major cause of broken bones and head injuries in people who are older than age 74. Take precautions to prevent a fall at home. Work with your health care provider to learn what changes you can make to improve your health and wellness and to prevent falls. This information is not intended to replace advice given to you by your health care provider. Make sure you discuss any questions you have with your health care provider. Document Revised: 07/20/2020 Document Reviewed: 07/20/2020 Elsevier Patient Education  2024 [employer]  

## 2022-10-24 NOTE — Assessment & Plan Note (Addendum)
Continues daily Trelegy and albuterol as needed Oxygen dependent.  O2 sats today lower than usual. Acute lower respiratory infection. Chest x-ray done today.  Report reviewed Recommend to start Augmentin 875 mg twice a day for 7 days

## 2022-10-24 NOTE — Progress Notes (Signed)
Warren [name] 72 y.o.   Chief Complaint  Patient presents with   New Patient (Initial Visit)    No concerns/questions     HISTORY OF PRESENT ILLNESS: This is a 72 y.o. male first visit to this office, here to establish care with me. Has the following chronic medical problems: 1.  Chronic atrial fibrillation on Eliquis.  Sees cardiologist on a regular basis 2.  COPD oxygen dependent.  Complaining of productive phlegm the last several days. 3.  Chronic pain syndrome from previous multiple trauma injuries.  Has been to pain management clinic in the past and has an upcoming appointment.  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  Not presently taking opioids per patient. 4.  BPH with lower urinary tract symptoms 5.  Dyslipidemia 6.  Chronic itching  HPI   Prior to Admission medications   Medication Sig Start Date End Date Taking? Authorizing Provider  apixaban (ELIQUIS) 5 MG TABS tablet Take 1 tablet (5 mg total) by mouth 2 (two) times daily. 06/25/20  Yes Pokhrel, Laxman, MD  atorvastatin (LIPITOR) 40 MG tablet TAKE 1 TABLET BY MOUTH DAILY 08/30/22  Yes Iran Ouch, MD  cyanocobalamin 1000 MCG tablet Take 1,000 mcg by mouth daily.   Yes [provider]  donepezil (ARICEPT) 10 MG tablet Take 10 mg by mouth at bedtime. 08/03/20  Yes [provider]  dutasteride (AVODART) 0.5 MG capsule Take 0.5 mg by mouth daily. 05/24/22  Yes [provider]  Fluticasone-Umeclidin-Vilant 100-62.5-25 MCG/INH AEPB Inhale 1 puff into the lungs daily. Trelegy   Yes [provider]  gabapentin (NEURONTIN) 300 MG capsule Take 300 mg by mouth in the morning, at noon, in the evening, and at bedtime. 07/06/20  Yes [provider]  ipratropium-albuterol (DUONEB) 0.5-2.5 (3) MG/3ML SOLN Take 3 mLs by nebulization 4 (four) times daily. 10/03/22  Yes [provider]  lidocaine (LIDODERM) 5 % Place 1 patch onto the skin daily. Remove & Discard patch within 12 hours  or as directed by MD 07/26/22  Yes Pokhrel, Rebekah Chesterfield, MD  Meclizine HCl 25 MG CHEW Chew 12.5 mg by mouth every 8 (eight) hours as needed (for dizziness). 07/05/22  Yes [provider]  mirtazapine (REMERON) 30 MG tablet Take 30 mg by mouth at bedtime. 04/25/22  Yes [provider]  omeprazole (PRILOSEC) 40 MG capsule Take 1 capsule (40 mg total) by mouth daily. 11/21/16  Yes Armbruster, Willaim Rayas, MD  PROAIR RESPICLICK 108 [phone] Base) MCG/ACT AEPB Inhale 2 puffs into the lungs every 4 (four) hours as needed (for wheezing and shortness of breath). 05/25/22  Yes [provider]  RESTASIS 0.05 % ophthalmic emulsion Place 1 drop into the left eye 2 (two) times daily. 03/01/21  Yes [provider]  tamsulosin (FLOMAX) 0.4 MG CAPS capsule Take 0.4 mg by mouth in the morning and at bedtime. 03/18/16  Yes [provider]  VITAMIN D, ERGOCALCIFEROL, PO Take 5,000 Units by mouth daily.   Yes [provider]  oxyCODONE (OXY IR/ROXICODONE) 5 MG immediate release tablet Take 1 tablet (5 mg total) by mouth every 4 (four) hours as needed for severe pain or breakthrough pain. Patient not taking: Reported on 10/24/2022 07/26/22   Joycelyn Das, MD  promethazine (PHENERGAN) 25 MG tablet Take 25 mg by mouth every 8 (eight) hours as needed for nausea. Patient not taking: Reported on 10/24/2022 04/27/21   [provider]    No Known Allergies  Patient Active Problem List  Diagnosis Date Noted   Paroxysmal A-fib (HCC) 07/21/2022   Hyperlipidemia 07/21/2022   BPH (benign prostatic hyperplasia) 07/21/2022   GERD (gastroesophageal reflux disease) 06/18/2020   S/P cholecystectomy 06/18/2020   History of pancreatitis 06/18/2020   History of pulmonary embolism 06/18/2020   COPD with acute exacerbation (HCC) 06/18/2020   ANXIETY 03/22/2007   TOBACCO ABUSE 03/22/2007   COPD 03/22/2007   GERD 03/22/2007   PANCREATITIS, HX OF 03/22/2007    Past Medical History:   Diagnosis Date   Allergy    Anemia    Aortic stenosis    COPD (chronic obstructive pulmonary disease) (HCC)    Elevated troponin    GERD (gastroesophageal reflux disease)    History of pulmonary embolus (PE)    Hypertension    Insomnia    OSA (obstructive sleep apnea)    PAD (peripheral artery disease) (HCC)    by CT imaging   PAF (paroxysmal atrial fibrillation) (HCC)    Septic shock (HCC)    Superficial venous thrombosis of arm     Past Surgical History:  Procedure Laterality Date   CHOLECYSTECTOMY     ERCP N/A 06/20/2020   Procedure: ENDOSCOPIC RETROGRADE CHOLANGIOPANCREATOGRAPHY (ERCP);  Surgeon: Rachael Fee, MD;  Location: [facility];  Service: Endoscopy;  Laterality: N/A;   SPHINCTEROTOMY  06/20/2020   Procedure: SPHINCTEROTOMY;  Surgeon: Rachael Fee, MD;  Location: [facility];  Service: Endoscopy;;  Balloon Sweep-No Stones    Social History   Socioeconomic History   Marital status: Divorced    Spouse name: Not on file   Number of children: Not on file   Years of education: Not on file   Highest education level: Not on file  Occupational History   Not on file  Tobacco Use   Smoking status: Former    Current packs/day: 0.00    Types: Cigarettes    Quit date: 03/15/2015    Years since quitting: 7.6   Smokeless tobacco: Never  Substance and Sexual Activity   Alcohol use: Not Currently   Drug use: Not on file   Sexual activity: Not on file  Other Topics Concern   Not on file  Social History Narrative   Not on file   Social Determinants of Health   Financial Resource Strain: Not on file  Food Insecurity: Not on file  Transportation Needs: No Transportation Needs (07/21/2022)   PRAPARE - Administrator, Civil Service (Medical): No    Lack of Transportation (Non-Medical): No  Physical Activity: Not on file  Stress: Not on file  Social Connections: Not on file  Intimate Partner Violence: Not on file    Family History  Problem  Relation Age of Onset   Colon cancer Neg Hx      Review of Systems  Constitutional: Negative.  Negative for chills and fever.  HENT: Negative.  Negative for congestion and sore throat.   Respiratory:  Positive for cough, sputum production and shortness of breath.   Cardiovascular: Negative.  Negative for chest pain and palpitations.  Gastrointestinal:  Negative for abdominal pain, diarrhea, nausea and vomiting.  Genitourinary: Negative.  Negative for dysuria and hematuria.  Skin: Negative.  Negative for rash.  Neurological: Negative.  Negative for dizziness and headaches.  All other systems reviewed and are negative.   Vitals:   10/24/22 0935  BP: 128/86  Pulse: 78  Temp: 97.9 F (36.6 C)  SpO2: (!) 86%    Physical Exam Vitals reviewed.  Constitutional:  Appearance: Normal appearance.  HENT:     Head: Normocephalic.  Eyes:     Extraocular Movements: Extraocular movements intact.  Cardiovascular:     Rate and Rhythm: Normal rate and regular rhythm.     Heart sounds: Murmur heard.  Pulmonary:     Effort: Pulmonary effort is normal.     Breath sounds: Wheezing and rhonchi present.  Musculoskeletal:     Cervical back: No tenderness.     Right lower leg: No edema.     Left lower leg: No edema.  Lymphadenopathy:     Cervical: No cervical adenopathy.  Skin:    General: Skin is warm and dry.  Neurological:     Mental Status: He is alert.  Psychiatric:        Mood and Affect: Mood normal.        Behavior: Behavior normal.    DG Chest 2 View  Result Date: 10/24/2022 CLINICAL DATA:  COPD with productive cough EXAM: CHEST - 2 VIEW COMPARISON:  None Available. FINDINGS: Cardiomediastinal silhouette is within normal limits. Similar hyperinflation of the lungs with flattening diaphragms. No significant pleural effusion. No pneumothorax. New heterogeneous densities in the right lung. No acute osseous abnormality. IMPRESSION: New heterogeneous densities of the right lung,  compatible with multifocal infection. Electronically Signed   By: Olive Bass M.D.   On: 10/24/2022 10:45     ASSESSMENT & PLAN: A total of 48 minutes was spent with the patient and counseling/coordination of care regarding preparing for this visit, review of available medical records, establishing care with me, review of multiple chronic medical conditions under management, comprehensive history and physical examination, review of all medications, pain management and need for follow-up with pain management clinic, education on nutrition, prognosis, documentation, and need for follow-up.  Problem List Items Addressed This Visit       Cardiovascular and Mediastinum   Paroxysmal A-fib (HCC)    Presently on normal sinus rhythm with well-controlled ventricular rate Continues Eliquis 5 mg twice a day No clinical bleeding episodes. Sees cardiologist on a regular basis.      Relevant Orders   Comprehensive metabolic panel     Respiratory   COPD with acute exacerbation (HCC) - Primary    Continues daily Trelegy and albuterol as needed Oxygen dependent.  O2 sats today lower than usual. Acute lower respiratory infection. Chest x-ray done today.  Report reviewed Recommend to start Augmentin 875 mg twice a day for 7 days      Relevant Medications   ipratropium-albuterol (DUONEB) 0.5-2.5 (3) MG/3ML SOLN   Other Relevant Orders   DG Chest 2 View     Digestive   GERD (gastroesophageal reflux disease)    Asymptomatic and well-controlled.  No concerns.        Genitourinary   BPH (benign prostatic hyperplasia)    Well-controlled.  Continues tamsulosin 0.4 mg daily        Other   Dyslipidemia    Continues atorvastatin 40 mg daily Lipid profile done today Diet and nutrition discussed      Relevant Orders   Comprehensive metabolic panel   Hemoglobin A1c   Lipid panel   Chronic pain syndrome    With history of opioid dependence in the past. Need follow-up with pain management  clinic Aware I will not be prescribing opiates or handling his chronic pain      Other Visit Diagnoses     Encounter to establish care       Lower respiratory  infection       Relevant Medications   amoxicillin-clavulanate (AUGMENTIN) 875-125 MG tablet   Other Relevant Orders   CBC with Differential/Platelet   DG Chest 2 View      Patient Instructions  Health Maintenance After Age 53 After age 71, you are at a higher risk for certain long-term diseases and infections as well as injuries from falls. Falls are a major cause of broken bones and head injuries in people who are older than age 74. Getting regular preventive care can help to keep you healthy and well. Preventive care includes getting regular testing and making lifestyle changes as recommended by your health care provider. Talk with your health care provider about: Which screenings and tests you should have. A screening is a test that checks for a disease when you have no symptoms. A diet and exercise plan that is right for you. What should I know about screenings and tests to prevent falls? Screening and testing are the best ways to find a health problem early. Early diagnosis and treatment give you the best chance of managing medical conditions that are common after age 24. Certain conditions and lifestyle choices may make you more likely to have a fall. Your health care provider may recommend: Regular vision checks. Poor vision and conditions such as cataracts can make you more likely to have a fall. If you wear glasses, make sure to get your prescription updated if your vision changes. Medicine review. Work with your health care provider to regularly review all of the medicines you are taking, including over-the-counter medicines. Ask your health care provider about any side effects that may make you more likely to have a fall. Tell your health care provider if any medicines that you take make you feel dizzy or sleepy. Strength  and balance checks. Your health care provider may recommend certain tests to check your strength and balance while standing, walking, or changing positions. Foot health exam. Foot pain and numbness, as well as not wearing proper footwear, can make you more likely to have a fall. Screenings, including: Osteoporosis screening. Osteoporosis is a condition that causes the bones to get weaker and break more easily. Blood pressure screening. Blood pressure changes and medicines to control blood pressure can make you feel dizzy. Depression screening. You may be more likely to have a fall if you have a fear of falling, feel depressed, or feel unable to do activities that you used to do. Alcohol use screening. Using too much alcohol can affect your balance and may make you more likely to have a fall. Follow these instructions at home: Lifestyle Do not drink alcohol if: Your health care provider tells you not to drink. If you drink alcohol: Limit how much you have to: 0-1 drink a day for women. 0-2 drinks a day for men. Know how much alcohol is in your drink. In the [place] one drink equals one 12 oz bottle of beer (355 mL), one 5 oz glass of wine (148 mL), or one 1 oz glass of hard liquor (44 mL). Do not use any products that contain nicotine or tobacco. These products include cigarettes, chewing tobacco, and vaping devices, such as e-cigarettes. If you need help quitting, ask your health care provider. Activity  Follow a regular exercise program to stay fit. This will help you maintain your balance. Ask your health care provider what types of exercise are appropriate for you. If you need a cane or walker, use it as recommended by your  health care provider. Wear supportive shoes that have nonskid soles. Safety  Remove any tripping hazards, such as rugs, cords, and clutter. Install safety equipment such as grab bars in bathrooms and safety rails on stairs. Keep rooms and walkways well-lit. General  instructions Talk with your health care provider about your risks for falling. Tell your health care provider if: You fall. Be sure to tell your health care provider about all falls, even ones that seem minor. You feel dizzy, tiredness (fatigue), or off-balance. Take over-the-counter and prescription medicines only as told by your health care provider. These include supplements. Eat a healthy diet and maintain a healthy weight. A healthy diet includes low-fat dairy products, low-fat (lean) meats, and fiber from whole grains, beans, and lots of fruits and vegetables. Stay current with your vaccines. Schedule regular health, dental, and eye exams. Summary Having a healthy lifestyle and getting preventive care can help to protect your health and wellness after age 74. Screening and testing are the best way to find a health problem early and help you avoid having a fall. Early diagnosis and treatment give you the best chance for managing medical conditions that are more common for people who are older than age 2. Falls are a major cause of broken bones and head injuries in people who are older than age 60. Take precautions to prevent a fall at home. Work with your health care provider to learn what changes you can make to improve your health and wellness and to prevent falls. This information is not intended to replace advice given to you by your health care provider. Make sure you discuss any questions you have with your health care provider. Document Revised: 07/20/2020 Document Reviewed: 07/20/2020 Elsevier Patient Education  2024 [employer]    Edwina Barth, MD [facility]

## 2022-10-24 NOTE — Assessment & Plan Note (Signed)
Continues atorvastatin 40 mg daily Lipid profile done today Diet and nutrition discussed

## 2022-10-24 NOTE — Assessment & Plan Note (Signed)
Presently on normal sinus rhythm with well-controlled ventricular rate Continues Eliquis 5 mg twice a day No clinical bleeding episodes. Sees cardiologist on a regular basis.

## 2022-10-24 NOTE — Assessment & Plan Note (Signed)
With history of opioid dependence in the past. Need follow-up with pain management clinic Aware I will not be prescribing opiates or handling his chronic pain

## 2022-11-01 ENCOUNTER — Other Ambulatory Visit: Payer: Self-pay | Admitting: Emergency Medicine

## 2022-11-01 DIAGNOSIS — J22 Unspecified acute lower respiratory infection: Secondary | ICD-10-CM
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
## 2022-11-12 ENCOUNTER — Inpatient Hospital Stay: Payer: Medicare (Managed Care) | Admitting: Hematology

## 2022-11-12 ENCOUNTER — Inpatient Hospital Stay: Payer: Medicare (Managed Care)

## 2022-11-12 ENCOUNTER — Other Ambulatory Visit: Payer: Self-pay

## 2022-11-12 ENCOUNTER — Encounter: Payer: Self-pay | Admitting: Hematology

## 2022-11-12 VITALS — BP 144/82 | HR 79 | Temp 97.9°F | Resp 18 | Wt 180.9 lb

## 2022-11-12 DIAGNOSIS — D649 Anemia, unspecified: Secondary | ICD-10-CM | POA: Diagnosis not present

## 2022-11-12 DIAGNOSIS — F17210 Nicotine dependence, cigarettes, uncomplicated: Secondary | ICD-10-CM

## 2022-11-12 DIAGNOSIS — Z7901 Long term (current) use of anticoagulants: Secondary | ICD-10-CM | POA: Diagnosis not present

## 2022-11-12 DIAGNOSIS — D7589 Other specified diseases of blood and blood-forming organs: Secondary | ICD-10-CM | POA: Insufficient documentation

## 2022-11-12 DIAGNOSIS — Z86711 Personal history of pulmonary embolism: Secondary | ICD-10-CM | POA: Diagnosis not present

## 2022-11-12 DIAGNOSIS — Z9981 Dependence on supplemental oxygen: Secondary | ICD-10-CM | POA: Insufficient documentation

## 2022-11-12 DIAGNOSIS — K635 Polyp of colon: Secondary | ICD-10-CM | POA: Insufficient documentation

## 2022-11-12 DIAGNOSIS — J449 Chronic obstructive pulmonary disease, unspecified: Secondary | ICD-10-CM | POA: Diagnosis not present

## 2022-11-12 LAB — CBC WITH DIFFERENTIAL (CANCER CENTER ONLY)
Abs Immature Granulocytes: 0.02 10*3/uL (ref 0.00–0.07)
Basophils Absolute: 0 10*3/uL (ref 0.0–0.1)
Basophils Relative: 1 %
Eosinophils Absolute: 0.2 10*3/uL (ref 0.0–0.5)
Eosinophils Relative: 3 %
HCT: 28.9 % — ABNORMAL LOW (ref 39.0–52.0)
Hemoglobin: 9 g/dL — ABNORMAL LOW (ref 13.0–17.0)
Immature Granulocytes: 0 %
Lymphocytes Relative: 11 %
Lymphs Abs: 0.7 10*3/uL (ref 0.7–4.0)
MCH: 29.9 pg (ref 26.0–34.0)
MCHC: 31.1 g/dL (ref 30.0–36.0)
MCV: 96 fL (ref 80.0–100.0)
Monocytes Absolute: 0.6 10*3/uL (ref 0.1–1.0)
Monocytes Relative: 10 %
Neutro Abs: 4.5 10*3/uL (ref 1.7–7.7)
Neutrophils Relative %: 75 %
Platelet Count: 253 10*3/uL (ref 150–400)
RBC: 3.01 MIL/uL — ABNORMAL LOW (ref 4.22–5.81)
RDW: 14.7 % (ref 11.5–15.5)
WBC Count: 6 10*3/uL (ref 4.0–10.5)
nRBC: 0 % (ref 0.0–0.2)

## 2022-11-12 LAB — IRON AND IRON BINDING CAPACITY (CC-WL,HP ONLY)
Iron: 22 ug/dL — ABNORMAL LOW (ref 45–182)
Saturation Ratios: 5 % — ABNORMAL LOW (ref 17.9–39.5)
TIBC: 459 ug/dL — ABNORMAL HIGH (ref 250–450)
UIBC: 437 ug/dL — ABNORMAL HIGH (ref 117–376)

## 2022-11-12 LAB — RETIC PANEL
Immature Retic Fract: 16.9 % — ABNORMAL HIGH (ref 2.3–15.9)
RBC.: 3.05 MIL/uL — ABNORMAL LOW (ref 4.22–5.81)
Retic Count, Absolute: 67 10*3/uL (ref 19.0–186.0)
Retic Ct Pct: 2.2 % (ref 0.4–3.1)
Reticulocyte Hemoglobin: 25.9 pg — ABNORMAL LOW (ref >27.9)

## 2022-11-12 LAB — VITAMIN B12: Vitamin B-12: 1052 pg/mL — ABNORMAL HIGH (ref 180–914)

## 2022-11-12 LAB — FOLATE: Folate: 15.1 ng/mL (ref >5.9)

## 2022-11-12 LAB — FERRITIN: Ferritin: 8 ng/mL — ABNORMAL LOW (ref 24–336)

## 2022-11-12 LAB — LACTATE DEHYDROGENASE: LDH: 126 U/L (ref 98–192)

## 2022-11-13 IMAGING — CT CT ABD-PELV W/O CM
2 of 4 series · 15 of 46 positions shown, 17 images · non-contrast
Comparison: 06/19/2020, CT chest 05/27/2016

CLINICAL DATA: Nausea, vomiting

EXAM:
CT ABDOMEN AND PELVIS WITHOUT CONTRAST
TECHNIQUE: Multidetector CT imaging of the abdomen and pelvis was performed
following the standard protocol without IV contrast.

[Series 2: axial st · axial · 0.75mm/px · z∈[+1062,+1458]mm · 12 of 89 slices shown, 14 images]
[im 5/89  soft-tissue]
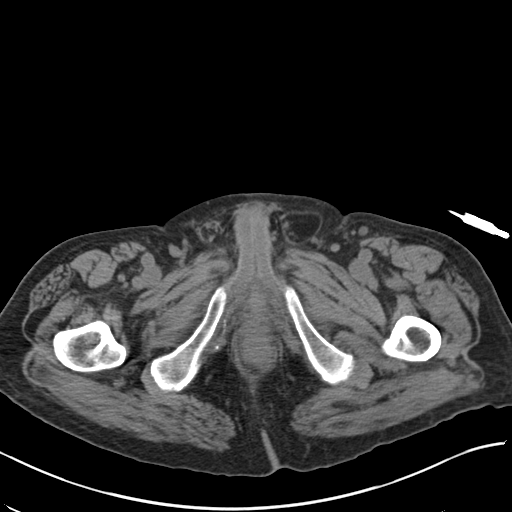
[im 5/89  bone]
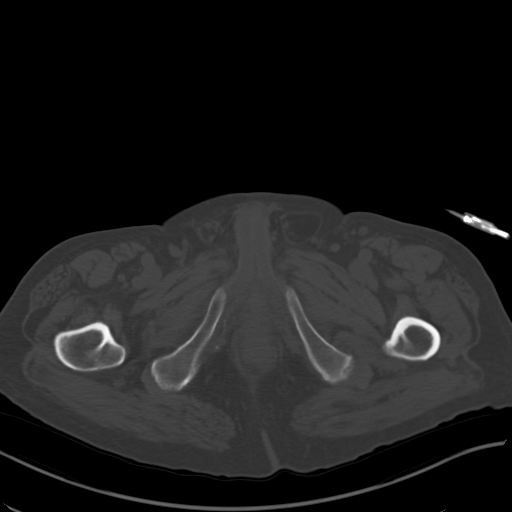
[im 14/89  soft-tissue]
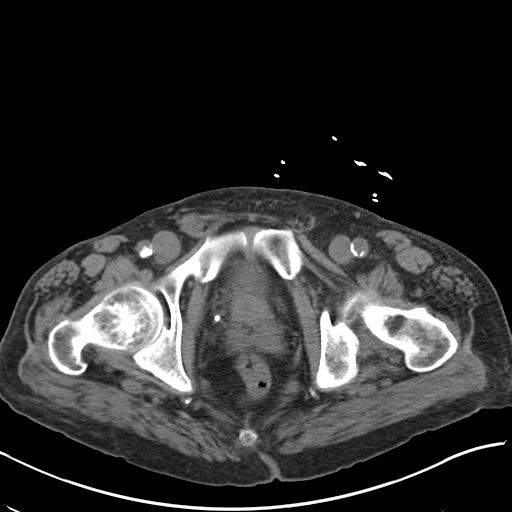
[im 19/89  soft-tissue]
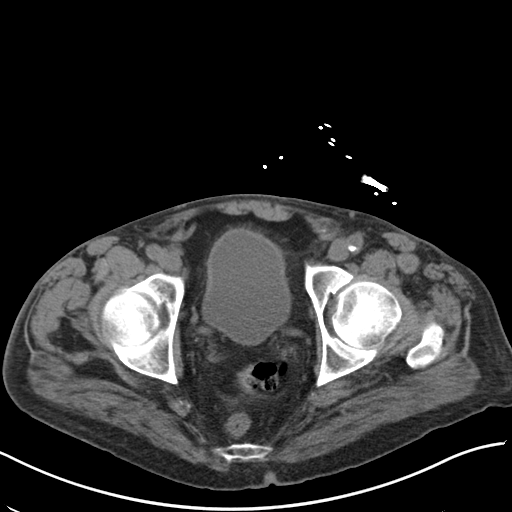
[im 28/89  soft-tissue]
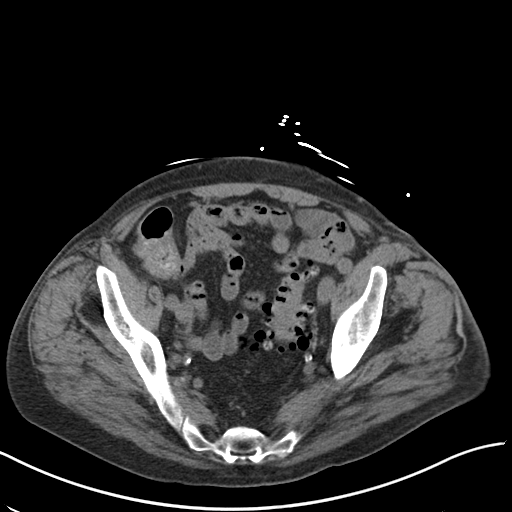
[im 33/89  soft-tissue]
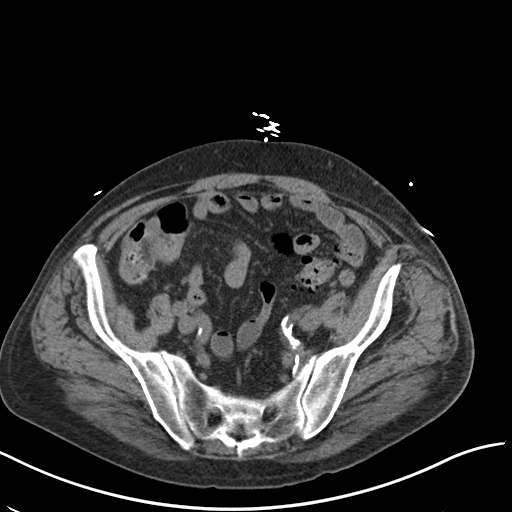
[im 42/89  soft-tissue]
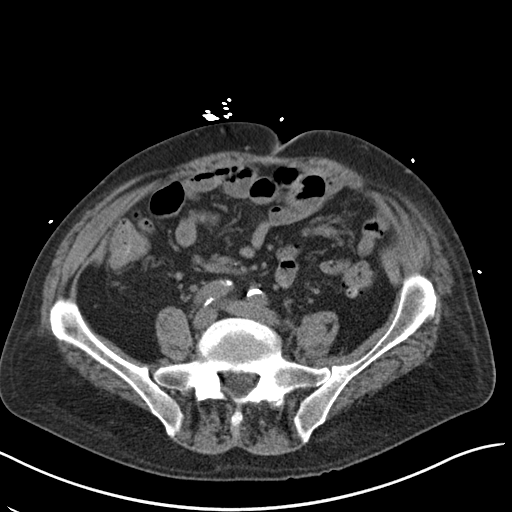
[im 47/89  soft-tissue]
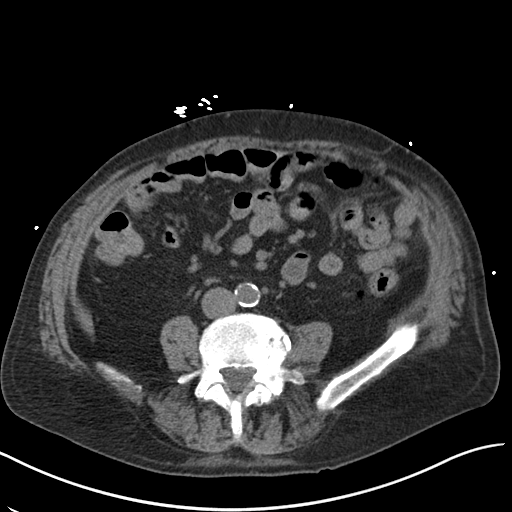
[im 56/89  soft-tissue]
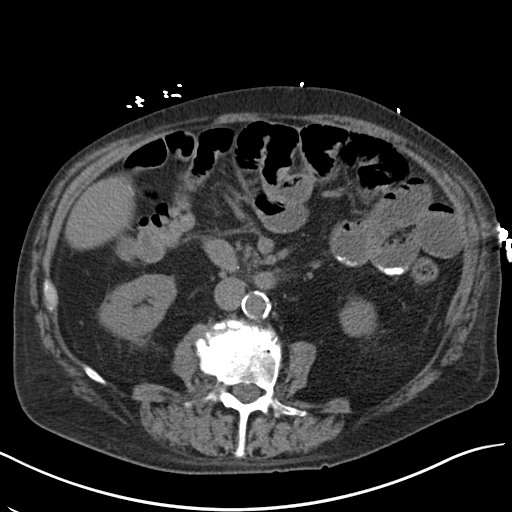
[im 61/89  soft-tissue]
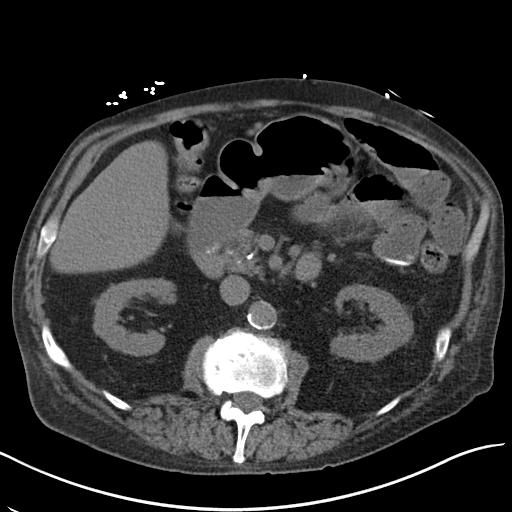
[im 61/89  bone]
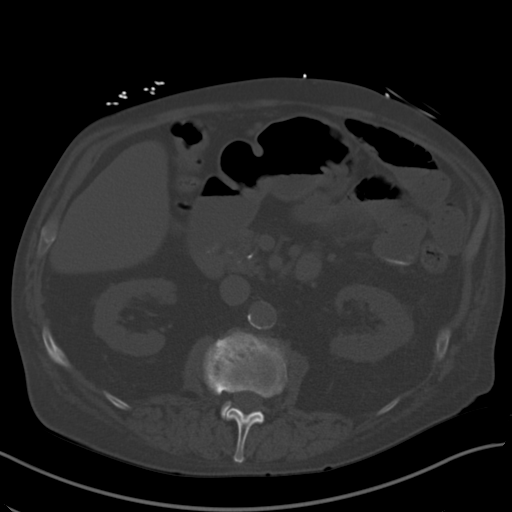
[im 70/89  soft-tissue]
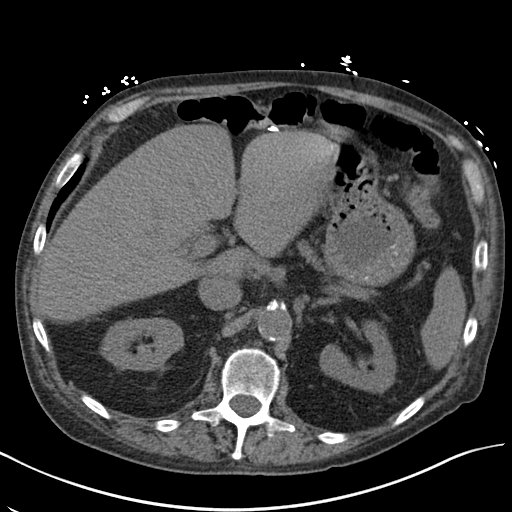
[im 75/89  soft-tissue]
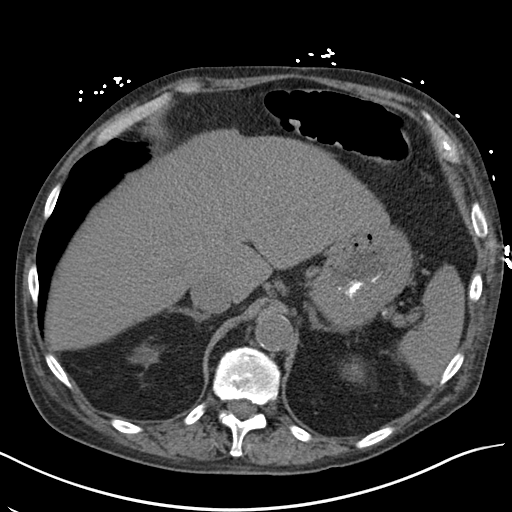
[im 84/89  soft-tissue]
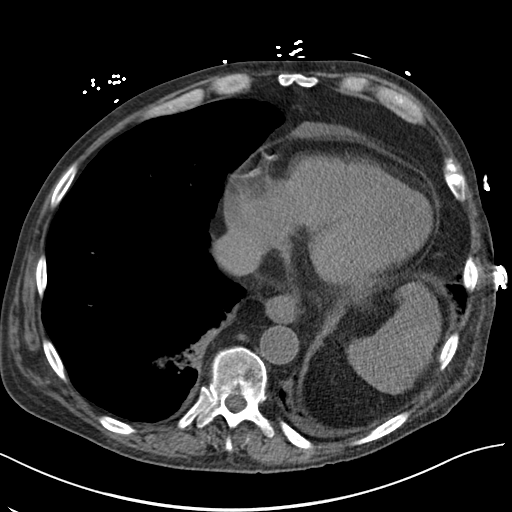

[Series 4: coronal st · coronal · 0.76mm/px · 3 of 159 slices shown]
[im 53/159  soft-tissue]
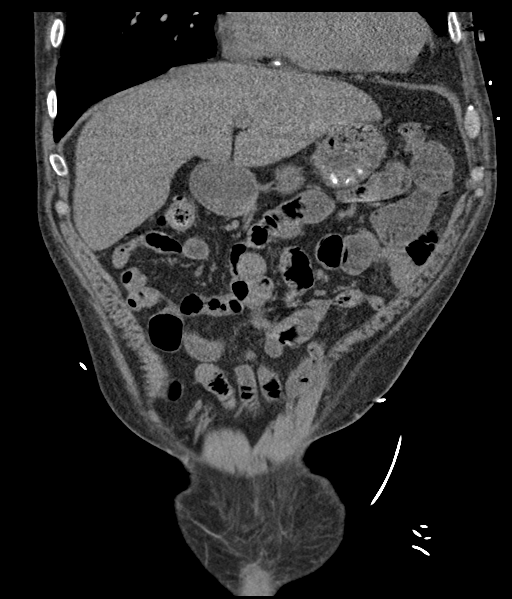
[im 71/159  soft-tissue]
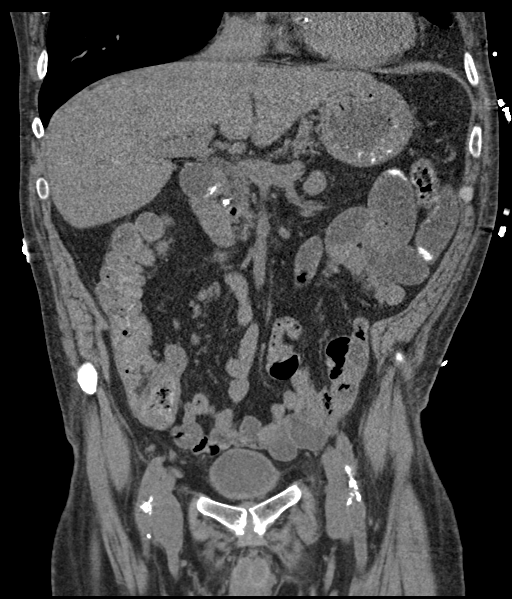
[im 88/159  soft-tissue]
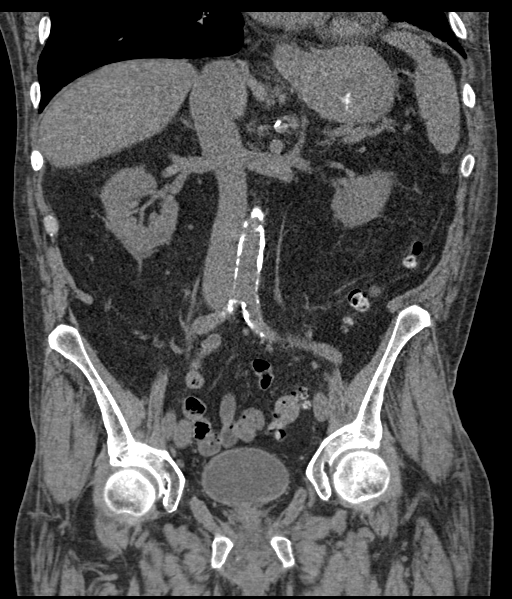

[15 of 46 positions shown; findings below may reference images not displayed]

FINDINGS: Lower chest: There is extensive bronchial wall thickening again
noted within the visualized lung bases bilaterally with
peribronchial and centrilobular nodularity noted within the a
peripheral right middle lobe in keeping with bronchiolitis.
Nodularity within the visualized left lung base appears progressive.
These findings can be seen in the setting of atypical infection or
aspiration. These are new when compared to remote prior examination
of 05/27/2016. Superimposed advanced emphysema is again noted.
Extensive right coronary artery calcification. Global cardiac size
within normal limits. Trace pericardial effusion.

Hepatobiliary: No focal liver abnormality is seen. Status post
cholecystectomy. No biliary dilatation.

Pancreas: Unremarkable

Spleen: Unremarkable

Adrenals/Urinary Tract: Adrenal glands are unremarkable. Kidneys are
normal, without renal calculi, focal lesion, or hydronephrosis.
Bladder is unremarkable.

Stomach/Bowel: Severe sigmoid diverticulosis. The stomach, small
bowel, and large bowel are otherwise unremarkable. No evidence of
obstruction or focal inflammation. Appendix normal. No free
intraperitoneal gas. Trace free fluid noted within the pelvis.

Vascular/Lymphatic: Extensive aortoiliac atherosclerotic
calcification. Particularly prominent atherosclerotic calcification
is seen at the origin of the superior mesenteric artery and renal
arteries bilaterally. While this almost certainly results in a
hemodynamically significant stenosis of the celiac axis and right
renal artery, the degree of stenosis is not well assessed on this
non arteriographic study. No pathologic adenopathy within the
abdomen and pelvis.

Reproductive: Prostate is unremarkable.

Other: No abdominal wall hernia. Left inguinal cord lipoma noted.
Rectum unremarkable.

Musculoskeletal: No acute bone abnormality. No lytic or blastic bone
lesions. Osseous structures are age-appropriate.
IMPRESSION: Extensive bronchial wall thickening and progressive peribronchial
nodularity noted at the lung bases bilaterally in keeping with
atypical infection or aspiration. Superimposed severe centrilobular
emphysema.

No acute intra-abdominal pathology identified.

Severe sigmoid diverticulosis.

Peripheral vascular disease.

Aortic Atherosclerosis (SHODA-RPF.F) and Emphysema (SHODA-5GL.L).

## 2022-11-15 ENCOUNTER — Telehealth: Payer: Self-pay

## 2022-11-15 ENCOUNTER — Other Ambulatory Visit: Payer: Self-pay

## 2022-11-15 DIAGNOSIS — D649 Anemia, unspecified: Secondary | ICD-10-CM

## 2022-11-15 NOTE — Telephone Encounter (Addendum)
Called patient and relayed message below as per Dr. Mosetta Putt. Patient agreed to have infusions made scheduling aware.    ---- Message from Malachy Mood sent at 11/13/2022  9:18 AM EDT ----- Please let pt know his iron is low, and I recommend iv iron. Please check and let me know which iron products are preferred and let Heather place the orders and get scheduled, plan to give about 1g iv iron in total, let Heather order future labs, and schedule lab monthlyX3 and see Heather in 3 months, thanks   [employer]  11/13/2022

## 2022-11-15 NOTE — Telephone Encounter (Signed)
-----   Message from Benancio Deeds sent at 11/15/2022  7:34 AM EDT ----- Regarding: outpatient follow up [facility] can you help get this patient an appointment in the office? I am out all week covering the hospital, not sure if one of the APPS have an opening in the next 1-2 weeks? He is a higher risk patient who will need procedures at the hospital, and needs to be seen before scheduling. Thanks!  Dr. Mervyn Skeeters ----- Message ----- From: Malachy Mood, MD Sent: 11/12/2022  11:16 AM EDT To: Benancio Deeds, MD; #  Dr. Alvy Bimler, thanks for the referral, I have done some anemia lab work today and will f/u on that.  Dr. Adela Lank, he is probably overdue for EGD and colonoscopy. Given his recent moderate anemia (iron level still pending), could you see him back for endoscopy?  Thanks all,  Malachy Mood

## 2022-11-15 NOTE — Telephone Encounter (Signed)
Pt has been scheduled for an appt with Doug Sou, PA-C on Tuesday, 11/22/22 at 9:30 am.  Attempted to reach patient twice - his vm is not set up at this time.

## 2022-11-16 NOTE — Telephone Encounter (Signed)
Attempted to reach pt by phone again, no vm set up.   Lm on vm for patient's son Gerilyn Pilgrim to return call to discuss appt.

## 2022-11-17 ENCOUNTER — Inpatient Hospital Stay: Payer: Medicare (Managed Care) | Attending: Hematology | Admitting: Hematology

## 2022-11-17 ENCOUNTER — Telehealth: Payer: Self-pay

## 2022-11-17 ENCOUNTER — Encounter: Payer: Self-pay | Admitting: Hematology

## 2022-11-17 ENCOUNTER — Other Ambulatory Visit: Payer: Self-pay

## 2022-11-17 DIAGNOSIS — D509 Iron deficiency anemia, unspecified: Secondary | ICD-10-CM | POA: Insufficient documentation

## 2022-11-17 DIAGNOSIS — D500 Iron deficiency anemia secondary to blood loss (chronic): Secondary | ICD-10-CM

## 2022-11-17 NOTE — Telephone Encounter (Signed)
Auth Submission: NO AUTH NEEDED Site of care: Site of care: [facility] Payer: [employer] Medicare Medication & CPT/J Code(s) submitted: Venofer (Iron Sucrose) J1756  Auth type: Buy/Bill PB Units/visits requested: 200mg  x 5 doses  Approval from: 11/17/22 to 03/19/23

## 2022-11-17 NOTE — Assessment & Plan Note (Signed)
-  initially seen by [place] on 11/12/2022 for new onset anemia -lab work from November 12, 2022 showed iron deficient anemia,  probably related to slow GI bleeding from anticoagulation -will give iv iron

## 2022-11-17 NOTE — Telephone Encounter (Signed)
Inbound call from patient returning phone call regarding 9/10 appointment. Patient is wondering if there is any afternoon times available. States he does not do too well in the mornings but will come in at 9:30 if there are no other times available. Patient requesting a call back. Please advise, thank you.

## 2022-11-17 NOTE — Progress Notes (Signed)
[facility]   Telephone:(336) [phone] Fax:(336) [phone]   Clinic Follow up Note   Patient Care Team: Erskine Emery, NP as PCP - General Nahser, Deloris Ping, MD as PCP - Cardiology (Cardiology) Malachy Mood, MD as Consulting Physician (Hematology and Oncology)  Date of Service:  11/17/2022  I connected with Warren [name] on 11/17/2022 at  2:40 PM EDT by telephone visit and verified that I am speaking with the correct person using two identifiers.  I discussed the limitations, risks, security and privacy concerns of performing an evaluation and management service by telephone and the availability of in person appointments. I also discussed with the patient that there may be a patient responsible charge related to this service. The patient expressed understanding and agreed to proceed.   Other persons participating in the visit and their role in the encounter:  No  Patient's location:  Home Provider's location:  [facility] Office  CHIEF COMPLAINT: f/u of Anemia   CURRENT THERAPY:  Iron Sucrose (Venofer)  ASSESSMENT & PLAN:  Warren [name] is a 72 y.o. male with    Iron deficiency anemia -initially seen by [place] on 11/12/2022 for new onset anemia -lab work from November 12, 2022 showed iron deficient anemia,  probably related to slow GI bleeding from anticoagulation -will give iv iron .  I discussed the benefit and potential side effect, especially allergy reactions, including anaphylactic reactions.  He voiced good understanding and agrees to proceed. -His last endoscopy was done in  2018, I recommended him to repeat the EGD in a colonoscopy due to history of iron deficient anemia.  PLAN: -recommend IV Iron Infusion and he is schedule -lab monthly -lab and f/u in 3 months - I send a message to GI Dr. Adela Lank for colonoscopy and EGD   INTERVAL HISTORY:  Warren [name] was contacted for a follow up of Anemia .He was last seen by me on 11/12/2022.    All other systems were  reviewed with the patient and are negative.  MEDICAL HISTORY:  Past Medical History:  Diagnosis Date   Allergy    Anemia    Aortic stenosis    COPD (chronic obstructive pulmonary disease) (HCC)    Elevated troponin    GERD (gastroesophageal reflux disease)    History of pulmonary embolus (PE)    Hypertension    Insomnia    OSA (obstructive sleep apnea)    PAD (peripheral artery disease) (HCC)    by CT imaging   PAF (paroxysmal atrial fibrillation) (HCC)    Septic shock (HCC)    Superficial venous thrombosis of arm     SURGICAL HISTORY: Past Surgical History:  Procedure Laterality Date   CHOLECYSTECTOMY     ERCP N/A 06/20/2020   Procedure: ENDOSCOPIC RETROGRADE CHOLANGIOPANCREATOGRAPHY (ERCP);  Surgeon: Rachael Fee, MD;  Location: [facility];  Service: Endoscopy;  Laterality: N/A;   SPHINCTEROTOMY  06/20/2020   Procedure: SPHINCTEROTOMY;  Surgeon: Rachael Fee, MD;  Location: [facility];  Service: Endoscopy;;  Balloon Sweep-No Stones    I have reviewed the social history and family history with the patient and they are unchanged from previous note.  ALLERGIES:  has no allergies on file.  MEDICATIONS:  Current Outpatient Medications  Medication Sig Dispense Refill   apixaban (ELIQUIS) 5 MG TABS tablet Take 1 tablet (5 mg total) by mouth 2 (two) times daily. 60 tablet 2   atorvastatin (LIPITOR) 40 MG tablet TAKE 1 TABLET BY MOUTH DAILY 90 tablet  3   cyanocobalamin 1000 MCG tablet Take 1,000 mcg by mouth daily.     donepezil (ARICEPT) 10 MG tablet Take 10 mg by mouth at bedtime.     dutasteride (AVODART) 0.5 MG capsule Take 0.5 mg by mouth daily.     Fluticasone-Umeclidin-Vilant 100-62.5-25 MCG/INH AEPB Inhale 1 puff into the lungs daily. Trelegy     gabapentin (NEURONTIN) 300 MG capsule Take 300 mg by mouth in the morning, at noon, in the evening, and at bedtime.     ipratropium-albuterol (DUONEB) 0.5-2.5 (3) MG/3ML SOLN Take 3 mLs by nebulization 4 (four) times  daily.     lidocaine (LIDODERM) 5 % Place 1 patch onto the skin daily. Remove & Discard patch within 12 hours or as directed by MD 30 patch 0   Meclizine HCl 25 MG CHEW Chew 12.5 mg by mouth every 8 (eight) hours as needed (for dizziness).     mirtazapine (REMERON) 30 MG tablet Take 30 mg by mouth at bedtime.     omeprazole (PRILOSEC) 40 MG capsule Take 1 capsule (40 mg total) by mouth daily. 90 capsule 3   oxyCODONE (OXY IR/ROXICODONE) 5 MG immediate release tablet Take 1 tablet (5 mg total) by mouth every 4 (four) hours as needed for severe pain or breakthrough pain. 30 tablet 0   PROAIR RESPICLICK 108 (90 Base) MCG/ACT AEPB Inhale 2 puffs into the lungs every 4 (four) hours as needed (for wheezing and shortness of breath).     promethazine (PHENERGAN) 25 MG tablet Take 25 mg by mouth every 8 (eight) hours as needed for nausea.     RESTASIS 0.05 % ophthalmic emulsion Place 1 drop into the left eye 2 (two) times daily.     tamsulosin (FLOMAX) 0.4 MG CAPS capsule Take 0.4 mg by mouth in the morning and at bedtime.     VITAMIN D, ERGOCALCIFEROL, PO Take 5,000 Units by mouth daily.     No current facility-administered medications for this visit.    PHYSICAL EXAMINATION: ECOG PERFORMANCE STATUS: 2 - Symptomatic, <50% confined to bed  There were no vitals filed for this visit. Wt Readings from Last 3 Encounters:  11/12/22 180 lb 14.4 oz (82.1 kg)  10/24/22 183 lb 6 oz (83.2 kg)  05/04/21 155 lb 6.4 oz (70.5 kg)     No vitals taken today, Exam not performed today  LABORATORY DATA:  I have reviewed the data as listed    Latest Ref Rng & Units 11/12/2022   11:21 AM 10/24/2022   10:15 AM 07/26/2022    3:01 AM  CBC  WBC 4.0 - 10.5 K/uL 6.0  9.1  8.3   Hemoglobin 13.0 - 17.0 g/dL 9.0  8.9  C 81.1   Hematocrit 39.0 - 52.0 % 28.9  27.3  32.1   Platelets 150 - 400 K/uL 253  433.0  249     C Corrected result        Latest Ref Rng & Units 10/24/2022   10:15 AM 07/26/2022    3:01 AM  07/25/2022    1:04 AM  CMP  Glucose 70 - 99 mg/dL 914  782  956   BUN 6 - 23 mg/dL 21  31  25    Creatinine 0.40 - 1.50 mg/dL 2.13  0.86  5.78   Sodium 135 - 145 mEq/L 133  131  133   Potassium 3.5 - 5.1 mEq/L 4.5  4.0  4.4   Chloride 96 - 112 mEq/L 96  94  93   CO2 19 - 32 mEq/L 31  30  27    Calcium 8.4 - 10.5 mg/dL 9.4  9.0  9.3   Total Protein 6.0 - 8.3 g/dL 7.0     Total Bilirubin 0.2 - 1.2 mg/dL 0.5     Alkaline Phos 39 - 117 U/L 69     AST 0 - 37 U/L 23     ALT 0 - 53 U/L 14         RADIOGRAPHIC STUDIES: I have personally reviewed the radiological images as listed and agreed with the findings in the report. No results found.    No orders of the defined types were placed in this encounter.  All questions were answered. The patient knows to call the clinic with any problems, questions or concerns. No barriers to learning was detected. The total time spent in the appointment was 15 minutes.     Malachy Mood, MD 11/17/2022   Carolin Coy am acting as scribe for Malachy Mood, MD.   I have reviewed the above documentation for accuracy and completeness, and I agree with the above.

## 2022-11-18 ENCOUNTER — Telehealth: Payer: Self-pay | Admitting: Hematology

## 2022-11-18 NOTE — Telephone Encounter (Signed)
Attempted to reach patient by phone. Not able to speak with patient or leave a vm. No afternoon appts available within the next few weeks. MyChart message sent to patient.

## 2022-11-21 ENCOUNTER — Ambulatory Visit (INDEPENDENT_AMBULATORY_CARE_PROVIDER_SITE_OTHER): Payer: Medicare (Managed Care)

## 2022-11-21 VITALS — BP 118/75 | HR 69 | Temp 97.8°F | Resp 14 | Ht 71.0 in | Wt 181.4 lb

## 2022-11-21 DIAGNOSIS — D508 Other iron deficiency anemias: Secondary | ICD-10-CM

## 2022-11-21 DIAGNOSIS — D509 Iron deficiency anemia, unspecified: Secondary | ICD-10-CM | POA: Diagnosis not present

## 2022-11-21 MED ORDER — DIPHENHYDRAMINE HCL 25 MG PO CAPS
25.0000 mg | ORAL_CAPSULE | Freq: Once | ORAL | Status: AC
  Administered 2022-11-21: 25 mg via ORAL
  Filled 2022-11-21: qty 1

## 2022-11-21 MED ORDER — SODIUM CHLORIDE 0.9 % IV SOLN
200.0000 mg | Freq: Once | INTRAVENOUS | Status: AC
  Administered 2022-11-21: 200 mg via INTRAVENOUS
  Filled 2022-11-21: qty 10

## 2022-11-21 MED ORDER — ACETAMINOPHEN 325 MG PO TABS
650.0000 mg | ORAL_TABLET | Freq: Once | ORAL | Status: AC
  Administered 2022-11-21: 650 mg via ORAL
  Filled 2022-11-21: qty 2

## 2022-11-21 NOTE — Progress Notes (Signed)
Diagnosis: Iron Deficiency Anemia  Provider:  Chilton Greathouse MD  Procedure: IV Infusion  IV Type: Peripheral, IV Location: L Antecubital  Venofer (Iron Sucrose), Dose: 200 mg  Infusion Start Time: 1358  Infusion Stop Time: 1418  Post Infusion IV Care: Observation period completed and Peripheral IV Discontinued  Discharge: Condition: Good, Destination: Home . AVS Provided  Performed by:  Rico Ala, LPN

## 2022-11-22 ENCOUNTER — Ambulatory Visit: Payer: Medicare (Managed Care) | Admitting: Gastroenterology

## 2022-11-22 ENCOUNTER — Encounter: Payer: Self-pay | Admitting: Gastroenterology

## 2022-11-22 VITALS — BP 110/52 | HR 88 | Ht 71.0 in | Wt 183.0 lb

## 2022-11-22 DIAGNOSIS — Z9981 Dependence on supplemental oxygen: Secondary | ICD-10-CM | POA: Insufficient documentation

## 2022-11-22 DIAGNOSIS — I480 Paroxysmal atrial fibrillation: Secondary | ICD-10-CM | POA: Diagnosis not present

## 2022-11-22 DIAGNOSIS — D509 Iron deficiency anemia, unspecified: Secondary | ICD-10-CM

## 2022-11-22 NOTE — Patient Instructions (Signed)
_______________________________________________________  If your blood pressure at your visit was 140/90 or greater, please contact your primary care physician to follow up on this.  _______________________________________________________  If you are age 74 or older, your body mass index should be between 23-30. Your Body mass index is 25.52 kg/m. If this is out of the aforementioned range listed, please consider follow up with your Primary Care Provider.  If you are age 49 or younger, your body mass index should be between 19-25. Your Body mass index is 25.52 kg/m. If this is out of the aformentioned range listed, please consider follow up with your Primary Care Provider.   ________________________________________________________  The [facility] providers would like to encourage you to use [facility] to communicate with providers for non-urgent requests or questions.  Due to long hold times on the telephone, sending your provider a message by [facility] may be a faster and more efficient way to get a response.  Please allow 48 business hours for a response.  Please remember that this is for non-urgent requests.  _______________________________________________________

## 2022-11-22 NOTE — Progress Notes (Signed)
I agree with assessment and plan as outlined. He warrants an EGD and colonoscopy to be done at the hospital.  He has a very long segment of Barrett's esophagus and is very overdue for surveillance of this area as well as his colon.  Unfortunately given his oxygen supplementation his case cannot be done in the outpatient setting and need to be done at the hospital.  My next available opening is not until November 18.  I would prefer him not to wait that long to get this case done, ideally would do his procedures as soon as possible.  I am going to ask one of my colleagues who has an opening sooner at the hospital to see if they can help with his case and if so, we can proceed at that time.  If not, however and there are no other openings, he may have to wait until November, I will put him on a wait list and try to get him done as soon as possible or otherwise with one of my colleagues if they have an opening.  Thanks

## 2022-11-22 NOTE — Progress Notes (Addendum)
11/22/2022 Warren [name] [id number] Sep 24, 1950   HISTORY OF PRESENT ILLNESS: This is a 72 year old male is a patient Dr. Lanetta Inch.  He has history of COPD on oxygen 3 L, hypertension, paroxysmal atrial fibrillation, aortic stenosis and coronary artery calcifications.  He is on Eliquis and follows with Dr. Elease Hashimoto as his primary cardiologist.  He is here today at the request of hematology, Dr. Mosetta Putt, for evaluation of iron deficiency anemia.  Recent hemoglobin 9.0 g.  MCV is normal, but iron studies are low.  He did receive IV iron infusion recently.  Vitamin B12 and folate levels are normal.  He denies seeing black or bloody stools.  He is on omeprazole 40 mg daily for acid reflux and says that that controls his symptoms well, no issues there.  Moves his bowels well.  Essentially no GI complaints today and was not sure why he was here.  EGD March 2018 showed a hiatal hernia, esophagitis to the mid to proximal esophagus, Barrett's esophagus, gastritis and duodenitis.  Colonoscopy March 2018 showed 5 polyps that were removed and diverticulosis.  1. Surgical [P], gastric antrum and gastric body - MILD CHRONIC GASTRITIS WITH REACTIVE GASTROPATHY. -A WARTHIN-STARRY STAIN IS NEGATIVE FOR HELICOBACTER PYLORI. -NEGATIVE FOR INTESTINAL METAPLASIA OR MALIGNANCY. 2. Surgical [P], esophageal BX at 36, 37 cm -BENIGN GLANDULAR MUCOSA WITH CHRONIC INFLAMMATION AND INTESTINAL METAPLASIA CONSISTENT WITH BARRETT'S ESOPHAGUS. -NEGATIVE FOR DYSPLASIA OR MALIGNANCY. 3. Surgical [P], esophageal BX at 34, 35 -BENIGN GLANDULAR MUCOSA WITH CHRONIC INFLAMMATION AND INTESTINAL METAPLASIA CONSISTENT WITH BARRETT'S ESOPHAGUS. -NEGATIVE FOR DYSPLASIA OR MALIGNANCY. 4. Surgical [P], esophageal BX at 33, 32 cm -BENIGN GLANDULAR MUCOSA WITH CHRONIC INFLAMMATION AND INTESTINAL METAPLASIA CONSISTENT WITH BARRETT'S ESOPHAGUS. -NEGATIVE FOR DYSPLASIA OR MALIGNANCY. 5. Surgical [P], esophageal BX at 31, 30 -BENIGN  GLANDULAR MUCOSA WITH CHRONIC INFLAMMATION AND INTESTINAL METAPLASIA CONSISTENT WITH BARRETT'S ESOPHAGUS. -NEGATIVE FOR DYSPLASIA OR MALIGNANCY. 6. Surgical [P], at 29, 28 cm - esophagheal BX -BENIGN GLANDULAR MUCOSA WITH CHRONIC INFLAMMATION AND INTESTINAL METAPLASIA CONSISTENT WITH BARRETT'S ESOPHAGUS. -NEGATIVE FOR DYSPLASIA OR MALIGNANCY. 7. Surgical [P], esophageal BX at 27-25 cm - HYPERPLASTIC SQUAMOUS MUCOSA WITH ACUTE INFLAMMATION. - NO DYSPLASIA OR MALIGNANCY IDENTIFIED. - A PAS-F STAIN WILL BE ORDERED TO EVALUATE FOR FUNGAL ORGANISMS AND REPORTED AS AN ADDENDUM. 8. Surgical [P], hepatic flexure, splenic flexure, descending, polyp (4) -TUBULAR ADENOMA AND BENIGN POLYPOID COLONIC MUCOSA WITH FOCI OF MELANOSIS COLI. -NO HIGH GRADE DYSPLASIA OR MALIGNANCY IDENTIFIED.   Past Medical History:  Diagnosis Date   Allergy    Anemia    Aortic stenosis    COPD (chronic obstructive pulmonary disease) (HCC)    Elevated troponin    GERD (gastroesophageal reflux disease)    History of pulmonary embolus (PE)    Hypertension    Insomnia    OSA (obstructive sleep apnea)    PAD (peripheral artery disease) (HCC)    by CT imaging   PAF (paroxysmal atrial fibrillation) (HCC)    Septic shock (HCC)    Superficial venous thrombosis of arm    Past Surgical History:  Procedure Laterality Date   CHOLECYSTECTOMY     ERCP N/A 06/20/2020   Procedure: ENDOSCOPIC RETROGRADE CHOLANGIOPANCREATOGRAPHY (ERCP);  Surgeon: Rachael Fee, MD;  Location: [facility];  Service: Endoscopy;  Laterality: N/A;   SPHINCTEROTOMY  06/20/2020   Procedure: SPHINCTEROTOMY;  Surgeon: Rachael Fee, MD;  Location: [facility];  Service: Endoscopy;;  Balloon Sweep-No Stones    reports that he quit smoking about 7 years ago.  His smoking use included cigarettes. He has never used smokeless tobacco. He reports that he does not currently use alcohol. No history on file for drug use. family history is not on file. No  Known Allergies    Outpatient Encounter Medications as of 11/22/2022  Medication Sig   apixaban (ELIQUIS) 5 MG TABS tablet Take 1 tablet (5 mg total) by mouth 2 (two) times daily.   atorvastatin (LIPITOR) 40 MG tablet TAKE 1 TABLET BY MOUTH DAILY   cyanocobalamin 1000 MCG tablet Take 1,000 mcg by mouth daily.   donepezil (ARICEPT) 10 MG tablet Take 10 mg by mouth at bedtime.   dutasteride (AVODART) 0.5 MG capsule Take 0.5 mg by mouth daily.   Fluticasone-Umeclidin-Vilant 100-62.5-25 MCG/INH AEPB Inhale 1 puff into the lungs daily. Trelegy   gabapentin (NEURONTIN) 300 MG capsule Take 300 mg by mouth in the morning, at noon, in the evening, and at bedtime.   ipratropium-albuterol (DUONEB) 0.5-2.5 (3) MG/3ML SOLN Take 3 mLs by nebulization 4 (four) times daily.   lidocaine (LIDODERM) 5 % Place 1 patch onto the skin daily. Remove & Discard patch within 12 hours or as directed by MD   Meclizine HCl 25 MG CHEW Chew 12.5 mg by mouth every 8 (eight) hours as needed (for dizziness).   mirtazapine (REMERON) 30 MG tablet Take 30 mg by mouth at bedtime.   omeprazole (PRILOSEC) 40 MG capsule Take 1 capsule (40 mg total) by mouth daily.   oxyCODONE (OXY IR/ROXICODONE) 5 MG immediate release tablet Take 1 tablet (5 mg total) by mouth every 4 (four) hours as needed for severe pain or breakthrough pain.   PROAIR RESPICLICK 108 (90 Base) MCG/ACT AEPB Inhale 2 puffs into the lungs every 4 (four) hours as needed (for wheezing and shortness of breath).   promethazine (PHENERGAN) 25 MG tablet Take 25 mg by mouth every 8 (eight) hours as needed for nausea.   RESTASIS 0.05 % ophthalmic emulsion Place 1 drop into the left eye 2 (two) times daily.   tamsulosin (FLOMAX) 0.4 MG CAPS capsule Take 0.4 mg by mouth in the morning and at bedtime.   VITAMIN D, ERGOCALCIFEROL, PO Take 5,000 Units by mouth daily.   No facility-administered encounter medications on file as of 11/22/2022.     REVIEW OF SYSTEMS  : All other  systems reviewed and negative except where noted in the History of Present Illness.   PHYSICAL EXAM: BP (!) 110/52   Pulse 88   Ht 5\' 11"  (1.803 m)   Wt 183 lb (83 kg)   BMI 25.52 kg/m  General: Well developed white male in no acute distress Head: Normocephalic and atraumatic Eyes:  Sclerae anicteric, conjunctiva pink. Ears: Normal auditory acuity Lungs: Sound coarse BS noted. Heart: Regular rate and rhythm; no M/R/G. Musculoskeletal: Symmetrical with no gross deformities  Skin: No lesions on visible extremities Extremities: No edema  Neurological: Alert oriented x 4, grossly non-focal Psychological:  Alert and cooperative. Normal mood and affect  ASSESSMENT AND PLAN: *Iron deficiency anemia: Hemoglobin 9.0 g with a normal MCV, but iron studies low.  He just received IV iron infusion by hematology.  Last EGD and colonoscopy in March 2018, was due for 3-year recall colonoscopy for history of polyps and one year recall for EGD for Barrett's esophagus. *History of Barrett's esophagus: Seen on endoscopy in March 2018.  Is on omeprazole 40 mg daily.  Was supposed to have repeat in one year. *Personal history of colon polyps: Had several tubular adenomas  removed in March 2018, was supposed to have a repeat in 2021. *O2 dependent:  on 3 Liters of O2. *Chronic anticoagulation with Eliquis due to history of atrial fibrillation:  Will hold Eliquis for 2 days prior to endoscopic procedures - will instruct when and how to resume after procedure. Benefits and risks of procedure explained including risks of bleeding, perforation, infection, missed lesions, reactions to medications and possible need for hospitalization and surgery for complications. Additional rare but real risk of stroke or other vascular clotting events off of Eliquis also explained and need to seek urgent help if any signs of these problems occur. Will communicate by phone or EMR with patient's prescribing provider, Dr. Elease Hashimoto, to  confirm that holding Eliquis is reasonable in this case.    **Will need EGD and colonoscopy at [facility] with Dr. Adela Lank.  Dr. Adela Lank to review first before scheduling.  CC:  Erskine Emery, NP

## 2022-11-23 ENCOUNTER — Telehealth: Payer: Self-pay

## 2022-11-23 ENCOUNTER — Ambulatory Visit (INDEPENDENT_AMBULATORY_CARE_PROVIDER_SITE_OTHER): Payer: Medicare (Managed Care)

## 2022-11-23 ENCOUNTER — Other Ambulatory Visit: Payer: Self-pay

## 2022-11-23 VITALS — BP 120/69 | HR 72 | Temp 98.3°F | Resp 17 | Ht 71.0 in | Wt 181.4 lb

## 2022-11-23 DIAGNOSIS — Z9981 Dependence on supplemental oxygen: Secondary | ICD-10-CM

## 2022-11-23 DIAGNOSIS — D509 Iron deficiency anemia, unspecified: Secondary | ICD-10-CM

## 2022-11-23 DIAGNOSIS — Z8719 Personal history of other diseases of the digestive system: Secondary | ICD-10-CM

## 2022-11-23 DIAGNOSIS — Z86010 Personal history of colonic polyps: Secondary | ICD-10-CM

## 2022-11-23 DIAGNOSIS — D508 Other iron deficiency anemias: Secondary | ICD-10-CM

## 2022-11-23 MED ORDER — ACETAMINOPHEN 325 MG PO TABS: 650.0000 mg | ORAL_TABLET | Freq: Once | ORAL | Status: DC

## 2022-11-23 MED ORDER — NA SULFATE-K SULFATE-MG SULF 17.5-3.13-1.6 GM/177ML PO SOLN: 1.0000 | Freq: Once | ORAL | 0 refills | Status: AC

## 2022-11-23 MED ORDER — DIPHENHYDRAMINE HCL 25 MG PO CAPS: 25.0000 mg | ORAL_CAPSULE | Freq: Once | ORAL | Status: DC

## 2022-11-23 MED ORDER — SODIUM CHLORIDE 0.9 % IV SOLN
200.0000 mg | Freq: Once | INTRAVENOUS | Status: AC
  Administered 2022-11-23: 200 mg via INTRAVENOUS
  Filled 2022-11-23: qty 10

## 2022-11-23 NOTE — Telephone Encounter (Signed)
-----   Message from [name] sent at 11/23/2022 12:40 PM EDT ----- Regarding: [facility] case Hi [name] - this patient needs an EGD and colonoscopy at the hospital. Needs to hold Eliquis 2 days prior to the exam. Can you book him into my November slot ([name] is out of the office for the next several days) - I should have one opening in November. I will reach out to my colleagues to see if anything else is open sooner. Dr. Tomasa Rand has an opening in October but told me he has a wait list of 15 so he is using that for his own.  If you can book the patient so he has a slot in November that would be great, and then if I have a cancellation I will try to bump him up sooner. Thanks  [name] - FYI  Dr. Mervyn Skeeters

## 2022-11-23 NOTE — Progress Notes (Signed)
Diagnosis: Iron Deficiency Anemia  Provider:  Chilton Greathouse MD  Procedure: IV Infusion  IV Type: Peripheral, IV Location: L Forearm  Venofer (Iron Sucrose), Dose: 200 mg  Infusion Start Time: 1437  Infusion Stop Time: 1452  Post Infusion IV Care: Patient declined observation and Peripheral IV Discontinued  Discharge: Condition: Good, Destination: Home . AVS Declined  Performed by:  Rico Ala, LPN

## 2022-11-23 NOTE — Telephone Encounter (Signed)
Patient informed of procedure time and date. Instructions sent to patient via Mychart and mailed patient a copy of instructions. Prep sent to pharmacy.

## 2022-11-23 NOTE — Telephone Encounter (Signed)
Heather, I scheduled EGD and colonoscopy at [facility] on 01/30/23 at 10 am with Dr. Adela Lank. Ambulatory referral has been entered and I have created a telephone encounter to request cardiac clearance. Please send patient prep instructions for appointment as they were seen in the office yesterday. Will also forward you cardiac clearance request. Thanks

## 2022-11-23 NOTE — Addendum Note (Signed)
Addended by: Mariane Duval on: 11/23/2022 04:12 PM   Modules accepted: Orders

## 2022-11-23 NOTE — Telephone Encounter (Signed)
[facility] Pre-operative Risk Assessment     Request for surgical clearance:     Endoscopy Procedure  What type of surgery is being performed?     Upper endoscopy and colonoscopy  When is this surgery scheduled?     01/30/23  What type of clearance is required ?   Pharmacy  Are there any medications that need to be held prior to surgery and how long? Eliquis - 2 days  Practice name and name of physician performing surgery?      [facility]  What is your office phone and fax number?      Phone- [phone]  Fax- [phone]  Anesthesia type (None, local, MAC, general) ?       MAC

## 2022-11-25 ENCOUNTER — Ambulatory Visit (INDEPENDENT_AMBULATORY_CARE_PROVIDER_SITE_OTHER): Payer: Medicare (Managed Care)

## 2022-11-25 VITALS — BP 130/77 | HR 66 | Temp 97.5°F | Resp 18 | Ht 71.0 in | Wt 181.6 lb

## 2022-11-25 DIAGNOSIS — D508 Other iron deficiency anemias: Secondary | ICD-10-CM

## 2022-11-25 DIAGNOSIS — D509 Iron deficiency anemia, unspecified: Secondary | ICD-10-CM

## 2022-11-25 MED ORDER — ACETAMINOPHEN 325 MG PO TABS: 650.0000 mg | ORAL_TABLET | Freq: Once | ORAL | Status: DC

## 2022-11-25 MED ORDER — DIPHENHYDRAMINE HCL 25 MG PO CAPS: 25.0000 mg | ORAL_CAPSULE | Freq: Once | ORAL | Status: DC

## 2022-11-25 MED ORDER — SODIUM CHLORIDE 0.9 % IV SOLN
200.0000 mg | Freq: Once | INTRAVENOUS | Status: AC
  Administered 2022-11-25: 200 mg via INTRAVENOUS
  Filled 2022-11-25: qty 10

## 2022-11-25 NOTE — Telephone Encounter (Signed)
Patient Name: Warren [name]  DOB: 03/25/1950 MRN: [id number]  Primary Cardiologist: Kristeen Miss, MD  Clinical pharmacists have reviewed the patient's past medical history, labs, and current medications as part of preoperative protocol coverage. The following recommendations have been made:   Patient with diagnosis of atrial fibrillation on Eliquis for anticoagulation.     What type of surgery is being performed?     Upper endoscopy and colonoscopy  When is this surgery scheduled?     01/30/23      CHA2DS2-VASc Score = 3   This indicates a 3.2% annual risk of stroke. The patient's score is based upon: CHF History: 0 HTN History: 1 Diabetes History: 0 Stroke History: 0 Vascular Disease History: 1 Age Score: 1 Gender Score: 0     CrCl 60 Platelet count 253   Patient does have history of PE in 2018, no indication if provoked or not   Per office protocol, patient can hold Eliquis for 2 days prior to procedure.   Patient will not need bridging with Lovenox (enoxaparin) around procedure. Please resume Eliquis as soon as possible postprocedure, at the discretion of the surgeon.   I will route this recommendation to the requesting party via Epic fax function and remove from pre-op pool.  Please call with questions.  Joylene Grapes, NP 11/25/2022, 1:16 PM

## 2022-11-25 NOTE — Telephone Encounter (Signed)
Patient with diagnosis of atrial fibrillation on Eliquis for anticoagulation.    What type of surgery is being performed?     Upper endoscopy and colonoscopy  When is this surgery scheduled?     01/30/23    CHA2DS2-VASc Score = 3   This indicates a 3.2% annual risk of stroke. The patient's score is based upon: CHF History: 0 HTN History: 1 Diabetes History: 0 Stroke History: 0 Vascular Disease History: 1 Age Score: 1 Gender Score: 0    CrCl 60 Platelet count 253  Patient does have history of PE in 2018, no indication if provoked or not  Per office protocol, patient can hold Eliquis for 2 days prior to procedure.   Patient will not need bridging with Lovenox (enoxaparin) around procedure.  **This guidance is not considered finalized until pre-operative APP has relayed final recommendations.**

## 2022-11-25 NOTE — Progress Notes (Signed)
Diagnosis: Iron Deficiency Anemia  Provider:  Chilton Greathouse MD  Procedure: IV Infusion  IV Type: Peripheral, IV Location: L Antecubital  Venofer (Iron Sucrose), Dose: 200 mg  Infusion Start Time: 1428  Infusion Stop Time: 1448  Post Infusion IV Care: Patient declined observation and Peripheral IV Discontinued  Discharge: Condition: Good, Destination: Home . AVS Declined  Performed by:  Rico Ala, LPN

## 2022-11-28 ENCOUNTER — Ambulatory Visit (INDEPENDENT_AMBULATORY_CARE_PROVIDER_SITE_OTHER): Payer: Medicare (Managed Care)

## 2022-11-28 VITALS — BP 130/73 | HR 72 | Temp 97.5°F | Resp 16 | Ht 71.0 in | Wt 182.4 lb

## 2022-11-28 DIAGNOSIS — D509 Iron deficiency anemia, unspecified: Secondary | ICD-10-CM | POA: Diagnosis not present

## 2022-11-28 DIAGNOSIS — D508 Other iron deficiency anemias: Secondary | ICD-10-CM

## 2022-11-28 MED ORDER — ACETAMINOPHEN 325 MG PO TABS: 650.0000 mg | ORAL_TABLET | Freq: Once | ORAL | Status: DC

## 2022-11-28 MED ORDER — DIPHENHYDRAMINE HCL 25 MG PO CAPS: 25.0000 mg | ORAL_CAPSULE | Freq: Once | ORAL | Status: DC

## 2022-11-28 MED ORDER — SODIUM CHLORIDE 0.9 % IV SOLN
200.0000 mg | Freq: Once | INTRAVENOUS | Status: AC
  Administered 2022-11-28: 200 mg via INTRAVENOUS
  Filled 2022-11-28: qty 10

## 2022-11-28 NOTE — Progress Notes (Signed)
Diagnosis: Iron Deficiency Anemia  Provider:  Chilton Greathouse MD  Procedure: IV Infusion  IV Type: Peripheral, IV Location: R Antecubital  Venofer (Iron Sucrose), Dose: 200 mg  Infusion Start Time: 1446  Infusion Stop Time: 1504  Post Infusion IV Care: Patient declined observation and Peripheral IV Discontinued  Discharge: Condition: Good, Destination: Home . AVS Declined  Performed by:  Rico Ala, LPN

## 2022-11-29 NOTE — Telephone Encounter (Signed)
Patient informed. 

## 2022-11-30 ENCOUNTER — Ambulatory Visit (INDEPENDENT_AMBULATORY_CARE_PROVIDER_SITE_OTHER): Payer: Medicare (Managed Care)

## 2022-11-30 VITALS — BP 134/68 | HR 81 | Temp 98.4°F | Resp 16 | Ht 71.0 in | Wt 183.6 lb

## 2022-11-30 DIAGNOSIS — D508 Other iron deficiency anemias: Secondary | ICD-10-CM

## 2022-11-30 DIAGNOSIS — D509 Iron deficiency anemia, unspecified: Secondary | ICD-10-CM | POA: Diagnosis not present

## 2022-11-30 MED ORDER — SODIUM CHLORIDE 0.9 % IV SOLN
200.0000 mg | Freq: Once | INTRAVENOUS | Status: AC
  Administered 2022-11-30: 200 mg via INTRAVENOUS
  Filled 2022-11-30: qty 10

## 2022-11-30 MED ORDER — ACETAMINOPHEN 325 MG PO TABS: 650.0000 mg | ORAL_TABLET | Freq: Once | ORAL | Status: DC

## 2022-11-30 MED ORDER — DIPHENHYDRAMINE HCL 25 MG PO CAPS: 25.0000 mg | ORAL_CAPSULE | Freq: Once | ORAL | Status: DC

## 2022-11-30 NOTE — Progress Notes (Signed)
Diagnosis: Iron Deficiency Anemia  Provider:  Chilton Greathouse MD  Procedure: IV Infusion  IV Type: Peripheral, IV Location: R Antecubital  Venofer (Iron Sucrose), Dose: 200 mg  Infusion Start Time: 1446  Infusion Stop Time: 1502  Post Infusion IV Care: Patient declined observation and Peripheral IV Discontinued  Discharge: Condition: Good, Destination: Home . AVS Provided.  Performed by:  Rico Ala, LPN

## 2022-12-05 ENCOUNTER — Encounter: Payer: Self-pay | Admitting: Family

## 2022-12-09 ENCOUNTER — Inpatient Hospital Stay: Payer: Medicare (Managed Care)

## 2022-12-09 ENCOUNTER — Telehealth: Payer: Self-pay

## 2022-12-09 DIAGNOSIS — D509 Iron deficiency anemia, unspecified: Secondary | ICD-10-CM | POA: Diagnosis present

## 2022-12-09 DIAGNOSIS — D649 Anemia, unspecified: Secondary | ICD-10-CM

## 2022-12-09 LAB — CBC WITH DIFFERENTIAL (CANCER CENTER ONLY)
Abs Immature Granulocytes: 0.02 10*3/uL (ref 0.00–0.07)
Basophils Absolute: 0 10*3/uL (ref 0.0–0.1)
Basophils Relative: 1 %
Eosinophils Absolute: 0.2 10*3/uL (ref 0.0–0.5)
Eosinophils Relative: 4 %
HCT: 34.9 % — ABNORMAL LOW (ref 39.0–52.0)
Hemoglobin: 11 g/dL — ABNORMAL LOW (ref 13.0–17.0)
Immature Granulocytes: 0 %
Lymphocytes Relative: 23 %
Lymphs Abs: 1.3 10*3/uL (ref 0.7–4.0)
MCH: 30.3 pg (ref 26.0–34.0)
MCHC: 31.5 g/dL (ref 30.0–36.0)
MCV: 96.1 fL (ref 80.0–100.0)
Monocytes Absolute: 0.7 10*3/uL (ref 0.1–1.0)
Monocytes Relative: 12 %
Neutro Abs: 3.6 10*3/uL (ref 1.7–7.7)
Neutrophils Relative %: 60 %
Platelet Count: 284 10*3/uL (ref 150–400)
RBC: 3.63 MIL/uL — ABNORMAL LOW (ref 4.22–5.81)
RDW: 18.7 % — ABNORMAL HIGH (ref 11.5–15.5)
WBC Count: 5.9 10*3/uL (ref 4.0–10.5)
nRBC: 0 % (ref 0.0–0.2)

## 2022-12-09 LAB — FERRITIN: Ferritin: 83 ng/mL (ref 24–336)

## 2022-12-09 LAB — IRON AND IRON BINDING CAPACITY (CC-WL,HP ONLY)
Iron: 96 ug/dL (ref 45–182)
Saturation Ratios: 28 % (ref 17.9–39.5)
TIBC: 349 ug/dL (ref 250–450)
UIBC: 253 ug/dL

## 2022-12-09 LAB — SAMPLE TO BLOOD BANK

## 2022-12-09 NOTE — Telephone Encounter (Signed)
CRITICAL VALUE STICKER  CRITICAL VALUE: Hgb. 11.0  RECEIVER : Ivan Maskell M,CMA  DATE & TIME NOTIFIED: 4:27 PM, 12/09/2022  MESSENGER: Efraim Kaufmann from Lab  MD NOTIFIED: Mosetta Putt  TIME OF NOTIFICATION: 4:28 pm  RESPONSE:

## 2023-01-06 ENCOUNTER — Inpatient Hospital Stay: Payer: Medicare (Managed Care) | Attending: Hematology

## 2023-01-13 DEATH — deceased

## 2023-01-23 ENCOUNTER — Encounter (HOSPITAL_COMMUNITY): Payer: Self-pay | Admitting: Gastroenterology

## 2023-01-23 NOTE — Progress Notes (Addendum)
Attempted to obtain medical history via telephone, unable to reach at this time. Unable to leave voicemail to return pre surgical testing department's phone call,due to mailbox not set up.   

## 2023-01-25 ENCOUNTER — Telehealth: Payer: Self-pay | Admitting: Gastroenterology

## 2023-01-25 NOTE — Telephone Encounter (Signed)
Inbound call from star at [facility] requesting a call back in regards to patient upcoming apt at the hospital. States she has made several attempts to pre cert and has been unsuccessful with anyone getting back in touch with her. Please advise at your earliest convenience.   [phone] Ext [phone]  Please advise.

## 2023-01-25 NOTE — Telephone Encounter (Signed)
Inbound call from pre service at [facility] regarding scheduled procedure for patient. States there is no insurance on file for the patient. Requesting a call back at [phone]. Please advise, thank you.

## 2023-01-30 ENCOUNTER — Ambulatory Visit (HOSPITAL_COMMUNITY)
Admission: RE | Admit: 2023-01-30 | Payer: Medicare (Managed Care) | Source: Home / Self Care | Admitting: Gastroenterology

## 2023-01-30 ENCOUNTER — Encounter (HOSPITAL_COMMUNITY): Admission: RE | Payer: Self-pay | Source: Home / Self Care

## 2023-01-30 SURGERY — ESOPHAGOGASTRODUODENOSCOPY (EGD) WITH PROPOFOL
Anesthesia: Monitor Anesthesia Care

## 2023-02-02 ENCOUNTER — Inpatient Hospital Stay: Payer: Medicare (Managed Care) | Admitting: Hematology

## 2023-02-02 ENCOUNTER — Inpatient Hospital Stay: Payer: Medicare (Managed Care)
